# Patient Record
Sex: Female | Born: 1940 | Race: Black or African American | Hispanic: No | Smoking: Never smoker
Health system: Southern US, Community
[De-identification: ages and names within clinical notes are randomized; demographics above are authoritative.]

## PROBLEM LIST (undated history)

## (undated) DIAGNOSIS — K56609 Unspecified intestinal obstruction, unspecified as to partial versus complete obstruction: Secondary | ICD-10-CM

## (undated) DIAGNOSIS — G8191 Hemiplegia, unspecified affecting right dominant side: Secondary | ICD-10-CM

## (undated) DIAGNOSIS — R51 Headache: Secondary | ICD-10-CM

## (undated) DIAGNOSIS — E119 Type 2 diabetes mellitus without complications: Secondary | ICD-10-CM

## (undated) DIAGNOSIS — R519 Headache, unspecified: Secondary | ICD-10-CM

## (undated) DIAGNOSIS — I639 Cerebral infarction, unspecified: Secondary | ICD-10-CM

## (undated) DIAGNOSIS — E785 Hyperlipidemia, unspecified: Secondary | ICD-10-CM

## (undated) DIAGNOSIS — N189 Chronic kidney disease, unspecified: Secondary | ICD-10-CM

## (undated) DIAGNOSIS — I10 Essential (primary) hypertension: Secondary | ICD-10-CM

## (undated) HISTORY — PX: EYE SURGERY: SHX253

## (undated) HISTORY — PX: BOWEL RESECTION: SHX1257

## (undated) HISTORY — PX: BLADDER SURGERY: SHX569

## (undated) HISTORY — PX: TUBAL LIGATION: SHX77

## (undated) HISTORY — DX: Headache: R51

## (undated) HISTORY — DX: Headache, unspecified: R51.9

## (undated) HISTORY — DX: Chronic kidney disease, unspecified: N18.9

---

## 1998-06-20 ENCOUNTER — Encounter: Payer: Self-pay | Admitting: Emergency Medicine

## 1998-06-20 ENCOUNTER — Inpatient Hospital Stay (HOSPITAL_COMMUNITY): Admission: EM | Admit: 1998-06-20 | Discharge: 1998-06-22 | Payer: Self-pay | Admitting: Emergency Medicine

## 1998-06-22 ENCOUNTER — Encounter: Payer: Self-pay | Admitting: Cardiology

## 1998-08-17 ENCOUNTER — Encounter: Payer: Self-pay | Admitting: Emergency Medicine

## 1998-08-18 ENCOUNTER — Inpatient Hospital Stay (HOSPITAL_COMMUNITY): Admission: EM | Admit: 1998-08-18 | Discharge: 1998-08-20 | Payer: Self-pay | Admitting: Emergency Medicine

## 1998-11-18 ENCOUNTER — Emergency Department (HOSPITAL_COMMUNITY): Admission: EM | Admit: 1998-11-18 | Discharge: 1998-11-18 | Payer: Self-pay | Admitting: Emergency Medicine

## 1998-11-18 ENCOUNTER — Encounter: Payer: Self-pay | Admitting: Emergency Medicine

## 1999-01-18 ENCOUNTER — Ambulatory Visit (HOSPITAL_COMMUNITY): Admission: RE | Admit: 1999-01-18 | Discharge: 1999-01-18 | Payer: Self-pay | Admitting: Cardiology

## 1999-11-08 ENCOUNTER — Encounter: Payer: Self-pay | Admitting: Internal Medicine

## 1999-11-08 ENCOUNTER — Ambulatory Visit (HOSPITAL_COMMUNITY): Admission: RE | Admit: 1999-11-08 | Discharge: 1999-11-08 | Payer: Self-pay | Admitting: Internal Medicine

## 2000-05-23 ENCOUNTER — Encounter: Admission: RE | Admit: 2000-05-23 | Discharge: 2000-05-23 | Payer: Self-pay | Admitting: Internal Medicine

## 2000-05-23 ENCOUNTER — Encounter: Payer: Self-pay | Admitting: Internal Medicine

## 2000-06-11 ENCOUNTER — Encounter: Payer: Self-pay | Admitting: Cardiology

## 2000-06-12 ENCOUNTER — Inpatient Hospital Stay (HOSPITAL_COMMUNITY): Admission: RE | Admit: 2000-06-12 | Discharge: 2000-06-14 | Payer: Self-pay | Admitting: Cardiology

## 2000-06-12 ENCOUNTER — Encounter: Payer: Self-pay | Admitting: Neurology

## 2000-06-19 ENCOUNTER — Emergency Department (HOSPITAL_COMMUNITY): Admission: EM | Admit: 2000-06-19 | Discharge: 2000-06-19 | Payer: Self-pay | Admitting: Emergency Medicine

## 2000-06-19 ENCOUNTER — Encounter: Payer: Self-pay | Admitting: Emergency Medicine

## 2000-06-22 ENCOUNTER — Emergency Department (HOSPITAL_COMMUNITY): Admission: EM | Admit: 2000-06-22 | Discharge: 2000-06-22 | Payer: Self-pay

## 2000-06-24 ENCOUNTER — Ambulatory Visit (HOSPITAL_COMMUNITY): Admission: RE | Admit: 2000-06-24 | Discharge: 2000-06-24 | Payer: Self-pay | Admitting: Urology

## 2000-06-24 ENCOUNTER — Encounter (INDEPENDENT_AMBULATORY_CARE_PROVIDER_SITE_OTHER): Payer: Self-pay | Admitting: Specialist

## 2000-06-24 ENCOUNTER — Encounter (INDEPENDENT_AMBULATORY_CARE_PROVIDER_SITE_OTHER): Payer: Self-pay

## 2000-09-06 ENCOUNTER — Other Ambulatory Visit: Admission: RE | Admit: 2000-09-06 | Discharge: 2000-09-06 | Payer: Self-pay | Admitting: Urology

## 2001-01-16 ENCOUNTER — Encounter: Payer: Self-pay | Admitting: Gastroenterology

## 2001-01-16 ENCOUNTER — Ambulatory Visit (HOSPITAL_COMMUNITY): Admission: RE | Admit: 2001-01-16 | Discharge: 2001-01-16 | Payer: Self-pay | Admitting: Gastroenterology

## 2001-01-22 ENCOUNTER — Ambulatory Visit (HOSPITAL_COMMUNITY): Admission: RE | Admit: 2001-01-22 | Discharge: 2001-01-22 | Payer: Self-pay | Admitting: Gastroenterology

## 2001-11-20 ENCOUNTER — Encounter: Admission: RE | Admit: 2001-11-20 | Discharge: 2002-02-18 | Payer: Self-pay | Admitting: Internal Medicine

## 2002-03-17 ENCOUNTER — Emergency Department (HOSPITAL_COMMUNITY): Admission: EM | Admit: 2002-03-17 | Discharge: 2002-03-17 | Payer: Self-pay | Admitting: Emergency Medicine

## 2002-03-17 ENCOUNTER — Encounter: Payer: Self-pay | Admitting: Emergency Medicine

## 2002-03-20 ENCOUNTER — Encounter: Payer: Self-pay | Admitting: Internal Medicine

## 2002-03-20 ENCOUNTER — Encounter: Admission: RE | Admit: 2002-03-20 | Discharge: 2002-03-20 | Payer: Self-pay | Admitting: Internal Medicine

## 2002-05-06 ENCOUNTER — Emergency Department (HOSPITAL_COMMUNITY): Admission: EM | Admit: 2002-05-06 | Discharge: 2002-05-06 | Payer: Self-pay | Admitting: Emergency Medicine

## 2005-04-25 ENCOUNTER — Emergency Department (HOSPITAL_COMMUNITY): Admission: EM | Admit: 2005-04-25 | Discharge: 2005-04-25 | Payer: Self-pay | Admitting: Emergency Medicine

## 2006-03-25 ENCOUNTER — Inpatient Hospital Stay (HOSPITAL_COMMUNITY): Admission: EM | Admit: 2006-03-25 | Discharge: 2006-03-29 | Payer: Self-pay | Admitting: Emergency Medicine

## 2006-03-26 ENCOUNTER — Encounter (INDEPENDENT_AMBULATORY_CARE_PROVIDER_SITE_OTHER): Payer: Self-pay | Admitting: *Deleted

## 2006-03-26 ENCOUNTER — Encounter (INDEPENDENT_AMBULATORY_CARE_PROVIDER_SITE_OTHER): Payer: Self-pay | Admitting: Cardiology

## 2006-04-02 ENCOUNTER — Inpatient Hospital Stay (HOSPITAL_COMMUNITY): Admission: EM | Admit: 2006-04-02 | Discharge: 2006-04-04 | Payer: Self-pay | Admitting: Emergency Medicine

## 2006-04-17 ENCOUNTER — Encounter: Admission: RE | Admit: 2006-04-17 | Discharge: 2006-05-15 | Payer: Self-pay | Admitting: Neurology

## 2007-01-24 ENCOUNTER — Encounter: Admission: RE | Admit: 2007-01-24 | Discharge: 2007-01-24 | Payer: Self-pay | Admitting: Nephrology

## 2007-03-24 ENCOUNTER — Ambulatory Visit (HOSPITAL_COMMUNITY): Admission: RE | Admit: 2007-03-24 | Discharge: 2007-03-24 | Payer: Self-pay | Admitting: Nephrology

## 2007-04-02 ENCOUNTER — Ambulatory Visit (HOSPITAL_COMMUNITY): Admission: RE | Admit: 2007-04-02 | Discharge: 2007-04-02 | Payer: Self-pay | Admitting: Nephrology

## 2008-03-12 ENCOUNTER — Inpatient Hospital Stay (HOSPITAL_COMMUNITY): Admission: EM | Admit: 2008-03-12 | Discharge: 2008-03-14 | Payer: Self-pay | Admitting: Emergency Medicine

## 2008-06-22 ENCOUNTER — Encounter: Admission: RE | Admit: 2008-06-22 | Discharge: 2008-06-22 | Payer: Self-pay | Admitting: Internal Medicine

## 2008-07-12 ENCOUNTER — Encounter: Admission: RE | Admit: 2008-07-12 | Discharge: 2008-08-12 | Payer: Self-pay | Admitting: Internal Medicine

## 2010-04-17 ENCOUNTER — Ambulatory Visit: Payer: Self-pay | Admitting: Cardiovascular Disease

## 2010-04-17 ENCOUNTER — Inpatient Hospital Stay (HOSPITAL_COMMUNITY): Admission: EM | Admit: 2010-04-17 | Discharge: 2010-05-03 | Payer: Self-pay | Admitting: Emergency Medicine

## 2010-04-19 ENCOUNTER — Encounter (INDEPENDENT_AMBULATORY_CARE_PROVIDER_SITE_OTHER): Payer: Self-pay | Admitting: Internal Medicine

## 2010-04-21 ENCOUNTER — Ambulatory Visit: Payer: Self-pay | Admitting: Psychiatry

## 2010-04-28 ENCOUNTER — Ambulatory Visit: Payer: Self-pay | Admitting: Oncology

## 2010-06-29 ENCOUNTER — Ambulatory Visit: Payer: Self-pay | Admitting: Psychiatry

## 2010-07-27 ENCOUNTER — Ambulatory Visit: Payer: Self-pay | Admitting: Oncology

## 2010-08-09 LAB — CHCC SMEAR

## 2010-08-09 LAB — CBC WITH DIFFERENTIAL/PLATELET
BASO%: 1.1 % (ref 0.0–2.0)
EOS%: 3 % (ref 0.0–7.0)
Eosinophils Absolute: 0.1 10*3/uL (ref 0.0–0.5)
MCHC: 31.9 g/dL (ref 31.5–36.0)
MCV: 94.4 fL (ref 79.5–101.0)
MONO%: 9.5 % (ref 0.0–14.0)
NEUT#: 2.1 10*3/uL (ref 1.5–6.5)
RBC: 3.95 10*6/uL (ref 3.70–5.45)
RDW: 17.5 % — ABNORMAL HIGH (ref 11.2–14.5)

## 2010-08-11 LAB — COMPREHENSIVE METABOLIC PANEL
ALT: 35 U/L (ref 0–35)
AST: 35 U/L (ref 0–37)
Alkaline Phosphatase: 68 U/L (ref 39–117)
CO2: 25 mEq/L (ref 19–32)
Sodium: 139 mEq/L (ref 135–145)
Total Bilirubin: 0.4 mg/dL (ref 0.3–1.2)
Total Protein: 7.2 g/dL (ref 6.0–8.3)

## 2010-08-11 LAB — SPEP & IFE WITH QIG
Albumin ELP: 54.8 % — ABNORMAL LOW (ref 55.8–66.1)
IgA: 392 mg/dL — ABNORMAL HIGH (ref 68–378)
IgG (Immunoglobin G), Serum: 1540 mg/dL (ref 694–1618)
IgM, Serum: 100 mg/dL (ref 60–263)
Total Protein, Serum Electrophoresis: 7.2 g/dL (ref 6.0–8.3)

## 2010-09-03 ENCOUNTER — Encounter: Payer: Self-pay | Admitting: Nephrology

## 2010-10-26 LAB — CBC
HCT: 32.7 % — ABNORMAL LOW (ref 36.0–46.0)
HCT: 28.8 % — ABNORMAL LOW (ref 36.0–46.0)
Hemoglobin: 10.4 g/dL — ABNORMAL LOW (ref 12.0–15.0)
Hemoglobin: 10.7 g/dL — ABNORMAL LOW (ref 12.0–15.0)
Hemoglobin: 9.8 g/dL — ABNORMAL LOW (ref 12.0–15.0)
MCH: 30.1 pg (ref 26.0–34.0)
MCH: 30.6 pg (ref 26.0–34.0)
MCH: 30.4 pg (ref 26.0–34.0)
MCH: 30.7 pg (ref 26.0–34.0)
MCHC: 32.7 g/dL (ref 30.0–36.0)
MCHC: 32.9 g/dL (ref 30.0–36.0)
MCHC: 33.7 g/dL (ref 30.0–36.0)
MCV: 93.4 fL (ref 78.0–100.0)
MCV: 92 fL (ref 78.0–100.0)
Platelets: 513 10*3/uL — ABNORMAL HIGH (ref 150–400)
Platelets: 292 10*3/uL (ref 150–400)
Platelets: 337 10*3/uL (ref 150–400)
Platelets: 395 10*3/uL (ref 150–400)
Platelets: 490 10*3/uL — ABNORMAL HIGH (ref 150–400)
RBC: 3.46 MIL/uL — ABNORMAL LOW (ref 3.87–5.11)
RBC: 3.5 MIL/uL — ABNORMAL LOW (ref 3.87–5.11)
RBC: 3.74 MIL/uL — ABNORMAL LOW (ref 3.87–5.11)
RDW: 14.7 % (ref 11.5–15.5)
RDW: 14.4 % (ref 11.5–15.5)
RDW: 14.5 % (ref 11.5–15.5)
RDW: 14.6 % (ref 11.5–15.5)
RDW: 14.7 % (ref 11.5–15.5)
WBC: 8.3 10*3/uL (ref 4.0–10.5)
WBC: 9.1 10*3/uL (ref 4.0–10.5)

## 2010-10-26 LAB — COMPREHENSIVE METABOLIC PANEL
AST: 45 U/L — ABNORMAL HIGH (ref 0–37)
Albumin: 3.1 g/dL — ABNORMAL LOW (ref 3.5–5.2)
Alkaline Phosphatase: 71 U/L (ref 39–117)
Chloride: 104 mEq/L (ref 96–112)
GFR calc Af Amer: 16 mL/min — ABNORMAL LOW (ref >60)
Potassium: 3.7 mEq/L (ref 3.5–5.1)
Sodium: 141 mEq/L (ref 135–145)
Total Bilirubin: 0.8 mg/dL (ref 0.3–1.2)
Total Protein: 8.2 g/dL (ref 6.0–8.3)

## 2010-10-26 LAB — CULTURE, BLOOD (ROUTINE X 2)
Culture: NO GROWTH
Culture: NO GROWTH

## 2010-10-26 LAB — GLUCOSE, CAPILLARY
Glucose-Capillary: 116 mg/dL — ABNORMAL HIGH (ref 70–99)
Glucose-Capillary: 122 mg/dL — ABNORMAL HIGH (ref 70–99)
Glucose-Capillary: 137 mg/dL — ABNORMAL HIGH (ref 70–99)
Glucose-Capillary: 142 mg/dL — ABNORMAL HIGH (ref 70–99)
Glucose-Capillary: 146 mg/dL — ABNORMAL HIGH (ref 70–99)
Glucose-Capillary: 170 mg/dL — ABNORMAL HIGH (ref 70–99)
Glucose-Capillary: 197 mg/dL — ABNORMAL HIGH (ref 70–99)
Glucose-Capillary: 206 mg/dL — ABNORMAL HIGH (ref 70–99)
Glucose-Capillary: 214 mg/dL — ABNORMAL HIGH (ref 70–99)
Glucose-Capillary: 218 mg/dL — ABNORMAL HIGH (ref 70–99)
Glucose-Capillary: 252 mg/dL — ABNORMAL HIGH (ref 70–99)
Glucose-Capillary: 285 mg/dL — ABNORMAL HIGH (ref 70–99)
Glucose-Capillary: 98 mg/dL (ref 70–99)
Glucose-Capillary: 99 mg/dL (ref 70–99)
Glucose-Capillary: 113 mg/dL — ABNORMAL HIGH (ref 70–99)
Glucose-Capillary: 123 mg/dL — ABNORMAL HIGH (ref 70–99)
Glucose-Capillary: 133 mg/dL — ABNORMAL HIGH (ref 70–99)
Glucose-Capillary: 134 mg/dL — ABNORMAL HIGH (ref 70–99)
Glucose-Capillary: 151 mg/dL — ABNORMAL HIGH (ref 70–99)
Glucose-Capillary: 153 mg/dL — ABNORMAL HIGH (ref 70–99)
Glucose-Capillary: 159 mg/dL — ABNORMAL HIGH (ref 70–99)
Glucose-Capillary: 161 mg/dL — ABNORMAL HIGH (ref 70–99)
Glucose-Capillary: 162 mg/dL — ABNORMAL HIGH (ref 70–99)
Glucose-Capillary: 167 mg/dL — ABNORMAL HIGH (ref 70–99)
Glucose-Capillary: 170 mg/dL — ABNORMAL HIGH (ref 70–99)
Glucose-Capillary: 185 mg/dL — ABNORMAL HIGH (ref 70–99)
Glucose-Capillary: 186 mg/dL — ABNORMAL HIGH (ref 70–99)
Glucose-Capillary: 186 mg/dL — ABNORMAL HIGH (ref 70–99)
Glucose-Capillary: 190 mg/dL — ABNORMAL HIGH (ref 70–99)
Glucose-Capillary: 205 mg/dL — ABNORMAL HIGH (ref 70–99)
Glucose-Capillary: 212 mg/dL — ABNORMAL HIGH (ref 70–99)
Glucose-Capillary: 237 mg/dL — ABNORMAL HIGH (ref 70–99)
Glucose-Capillary: 254 mg/dL — ABNORMAL HIGH (ref 70–99)
Glucose-Capillary: 256 mg/dL — ABNORMAL HIGH (ref 70–99)
Glucose-Capillary: 264 mg/dL — ABNORMAL HIGH (ref 70–99)
Glucose-Capillary: 279 mg/dL — ABNORMAL HIGH (ref 70–99)
Glucose-Capillary: 78 mg/dL (ref 70–99)

## 2010-10-26 LAB — URINE CULTURE
Colony Count: NO GROWTH
Colony Count: >=100000
Culture  Setup Time: 201109182058
Culture: NO GROWTH
Culture: NO GROWTH

## 2010-10-26 LAB — URINALYSIS, ROUTINE W REFLEX MICROSCOPIC
Glucose, UA: NEGATIVE mg/dL
Glucose, UA: 100 mg/dL — AB
Glucose, UA: NEGATIVE mg/dL
Hgb urine dipstick: NEGATIVE
Ketones, ur: 15 mg/dL — AB
Ketones, ur: 15 mg/dL — AB
Ketones, ur: 15 mg/dL — AB
Leukocytes, UA: NEGATIVE
Nitrite: NEGATIVE
Nitrite: NEGATIVE
Nitrite: NEGATIVE
Protein, ur: NEGATIVE mg/dL
Specific Gravity, Urine: 1.025 (ref 1.005–1.030)
Specific Gravity, Urine: 1.023 (ref 1.005–1.030)
Specific Gravity, Urine: 1.023 (ref 1.005–1.030)
Urobilinogen, UA: 1 mg/dL (ref 0.0–1.0)
pH: 5 (ref 5.0–8.0)
pH: 5 (ref 5.0–8.0)
pH: 5.5 (ref 5.0–8.0)

## 2010-10-26 LAB — TSH: TSH: 3.895 u[IU]/mL (ref 0.350–4.500)

## 2010-10-26 LAB — BASIC METABOLIC PANEL
BUN: 25 mg/dL — ABNORMAL HIGH (ref 6–23)
BUN: 24 mg/dL — ABNORMAL HIGH (ref 6–23)
BUN: 26 mg/dL — ABNORMAL HIGH (ref 6–23)
BUN: 33 mg/dL — ABNORMAL HIGH (ref 6–23)
CO2: 22 mEq/L (ref 19–32)
CO2: 23 mEq/L (ref 19–32)
CO2: 24 mEq/L (ref 19–32)
CO2: 23 mEq/L (ref 19–32)
Calcium: 9 mg/dL (ref 8.4–10.5)
Calcium: 9.2 mg/dL (ref 8.4–10.5)
Calcium: 8.8 mg/dL (ref 8.4–10.5)
Calcium: 9.3 mg/dL (ref 8.4–10.5)
Calcium: 9.5 mg/dL (ref 8.4–10.5)
Chloride: 102 mEq/L (ref 96–112)
Chloride: 104 mEq/L (ref 96–112)
Chloride: 109 mEq/L (ref 96–112)
Creatinine, Ser: 1.33 mg/dL — ABNORMAL HIGH (ref 0.4–1.2)
Creatinine, Ser: 1.38 mg/dL — ABNORMAL HIGH (ref 0.4–1.2)
Creatinine, Ser: 1.54 mg/dL — ABNORMAL HIGH (ref 0.4–1.2)
Creatinine, Ser: 1.85 mg/dL — ABNORMAL HIGH (ref 0.4–1.2)
GFR calc Af Amer: 41 mL/min — ABNORMAL LOW (ref >60)
GFR calc Af Amer: 46 mL/min — ABNORMAL LOW (ref >60)
GFR calc Af Amer: 48 mL/min — ABNORMAL LOW (ref >60)
GFR calc Af Amer: 33 mL/min — ABNORMAL LOW (ref >60)
GFR calc non Af Amer: 39 mL/min — ABNORMAL LOW (ref >60)
GFR calc non Af Amer: 27 mL/min — ABNORMAL LOW (ref >60)
GFR calc non Af Amer: 33 mL/min — ABNORMAL LOW (ref >60)
GFR calc non Af Amer: 33 mL/min — ABNORMAL LOW (ref >60)
GFR calc non Af Amer: 37 mL/min — ABNORMAL LOW (ref >60)
Glucose, Bld: 136 mg/dL — ABNORMAL HIGH (ref 70–99)
Glucose, Bld: 148 mg/dL — ABNORMAL HIGH (ref 70–99)
Glucose, Bld: 305 mg/dL — ABNORMAL HIGH (ref 70–99)
Potassium: 4.1 mEq/L (ref 3.5–5.1)
Potassium: 4.2 mEq/L (ref 3.5–5.1)
Potassium: 5.1 mEq/L (ref 3.5–5.1)
Potassium: 2.9 mEq/L — ABNORMAL LOW (ref 3.5–5.1)
Potassium: 4 mEq/L (ref 3.5–5.1)
Potassium: 4.2 mEq/L (ref 3.5–5.1)
Potassium: 4.4 mEq/L (ref 3.5–5.1)
Sodium: 135 mEq/L (ref 135–145)
Sodium: 136 mEq/L (ref 135–145)
Sodium: 137 mEq/L (ref 135–145)
Sodium: 136 mEq/L (ref 135–145)
Sodium: 141 mEq/L (ref 135–145)

## 2010-10-26 LAB — DIFFERENTIAL
Basophils Absolute: 0 10*3/uL (ref 0.0–0.1)
Basophils Relative: 0 % (ref 0–1)
Eosinophils Relative: 0 % (ref 0–5)
Lymphocytes Relative: 14 % (ref 12–46)
Monocytes Absolute: 0.6 10*3/uL (ref 0.1–1.0)
Monocytes Relative: 7 % (ref 3–12)

## 2010-10-26 LAB — PROTEIN ELECTROPH W RFLX QUANT IMMUNOGLOBULINS
Alpha-2-Globulin: 18.9 % — ABNORMAL HIGH (ref 7.1–11.8)
Beta Globulin: 5.5 % (ref 4.7–7.2)
Gamma Globulin: 19.8 % — ABNORMAL HIGH (ref 11.1–18.8)
M-Spike, %: NOT DETECTED g/dL

## 2010-10-26 LAB — CK TOTAL AND CKMB (NOT AT ARMC)
CK, MB: 5.5 ng/mL — ABNORMAL HIGH (ref 0.3–4.0)
Relative Index: 0.3 (ref 0.0–2.5)
Relative Index: 0.9 (ref 0.0–2.5)
Total CK: 1745 U/L — ABNORMAL HIGH (ref 7–177)
Total CK: 656 U/L — ABNORMAL HIGH (ref 7–177)

## 2010-10-26 LAB — URINALYSIS, MICROSCOPIC ONLY
Glucose, UA: NEGATIVE mg/dL
Hgb urine dipstick: NEGATIVE
Specific Gravity, Urine: 1.021 (ref 1.005–1.030)
pH: 5.5 (ref 5.0–8.0)

## 2010-10-26 LAB — IGG, IGA, IGM
IgA: 531 mg/dL — ABNORMAL HIGH (ref 68–378)
IgG (Immunoglobin G), Serum: 1740 mg/dL — ABNORMAL HIGH (ref 694–1618)
IgM, Serum: 106 mg/dL (ref 60–263)

## 2010-10-26 LAB — CARDIAC PANEL(CRET KIN+CKTOT+MB+TROPI)
CK, MB: 5.2 ng/mL — ABNORMAL HIGH (ref 0.3–4.0)
CK, MB: 6.2 ng/mL (ref 0.3–4.0)
CK, MB: 7.1 ng/mL (ref 0.3–4.0)
Relative Index: 0.3 (ref 0.0–2.5)
Relative Index: 0.7 (ref 0.0–2.5)
Relative Index: 0.8 (ref 0.0–2.5)
Total CK: 1527 U/L — ABNORMAL HIGH (ref 7–177)
Total CK: 1661 U/L — ABNORMAL HIGH (ref 7–177)
Total CK: 894 U/L — ABNORMAL HIGH (ref 7–177)
Troponin I: 0.1 ng/mL — ABNORMAL HIGH (ref 0.00–0.06)
Troponin I: 0.14 ng/mL — ABNORMAL HIGH (ref 0.00–0.06)

## 2010-10-26 LAB — HEPATIC FUNCTION PANEL
ALT: 17 U/L (ref 0–35)
Bilirubin, Direct: 0.1 mg/dL (ref 0.0–0.3)
Indirect Bilirubin: 0.5 mg/dL (ref 0.3–0.9)
Total Bilirubin: 0.6 mg/dL (ref 0.3–1.2)

## 2010-10-26 LAB — BRAIN NATRIURETIC PEPTIDE: Pro B Natriuretic peptide (BNP): 46 pg/mL (ref 0.0–100.0)

## 2010-10-26 LAB — HEMOGLOBIN A1C: Hgb A1c MFr Bld: 9.5 % — ABNORMAL HIGH (ref <5.7)

## 2010-10-26 LAB — URINE MICROSCOPIC-ADD ON

## 2010-10-26 LAB — UIFE/LIGHT CHAINS/TP QN, 24-HR UR
Albumin, U: DETECTED
Alpha 1, Urine: DETECTED — AB

## 2010-10-26 LAB — ALDOLASE: Aldolase: 9.7 U/L — ABNORMAL HIGH (ref <=8.1)

## 2010-10-26 LAB — APTT: aPTT: 30 seconds (ref 24–37)

## 2010-10-26 LAB — TROPONIN I: Troponin I: 0.11 ng/mL — ABNORMAL HIGH (ref 0.00–0.06)

## 2010-12-26 NOTE — Discharge Summary (Signed)
NAMECYMONE, CIFARELLI                ACCOUNT NO.:  [id number]   MEDICAL RECORD NO.:  [id number]          PATIENT TYPE:  INP   LOCATION:  2024                         FACILITY:  [facility]   PHYSICIAN:  Hind I Elsaid, MD      DATE OF BIRTH:  1941/07/04   DATE OF ADMISSION:  03/11/2008  DATE OF DISCHARGE:  03/14/2008                               DISCHARGE SUMMARY   PRIMARY CARE PHYSICIAN:  Thressa Sheller, MD   DISCHARGE DIAGNOSES:  1. Hypertension urgency.  2. Uncontrolled diabetes mellitus with hemoglobin C1 of 11.7.  3. Group B Streptococcal agalactiae urinary tract infection.  4. History of stroke.  5. History of patent foramen ovale.  6. mild elevated troponin felt secondary to hypertension urgency.   DISCHARGE MEDICATIONS:  1. Hydrochlorothiazide 25 mg daily.  2. Clonidine 0.2 mg twice daily.  3. Aggrenox 1 tab twice daily.  4. Benazepril 20 mg daily.  5. Lantus 20 units subcu q.h.s.  6. Glipizide 2.5 mg daily.  7. Augmentin 500 mg twice daily for 5 days.  8. Darvocet 1-2 tabs p.o. q.6 h. p.r.n.   CONSULTATIONS:  None.   PROCEDURES:  CT head, no acute intracranial abnormalities.   HOSPITAL COURSE:  1. This is a 10, African American female, history of hypertension and      previous history of stroke.  She was sent from Dr. Doristine Devoid      office after found to have blood pressure of 204/140.  The patient      admitted to the hospital with a diagnosis of hypertensive urgency,      started on IV labetalol and plan is to control her blood pressure.      Her blood pressure at this time is under good control.  We      increased her clonidine to 0.2 mg q.8 h. and increased her      hydrochlorothiazide 25 mg daily.  2. Hypokalemia, status post replacement.  3. Diabetes mellitus.  The patient has hemoglobin C1 of 11, insulin      Lantus was started.  During hospitalization, the patient admitted      unable to take metformin secondary to metformin induced diarrhea.      For that,  we will increase her Lantus to 20 units subcu and add a      small dose of glipizide.  Further recommendation to be done by Dr.      Noah Delaine at his office.  4. Elevated troponin felt to be secondary to hypertension and mild      renal insufficiency.  The patient already on aspirin.  No further      workup was done and the patient does not have any chest pain and      there are no EKG changes.  5. History of cerebrovascular accident, to continue with Aggrenox.   We felt the patient is medically stable to be discharged home, follow  with Dr. Noah Delaine as an outpatient for further control of her blood  pressure and her diabetes.      Hind I Laurie Panda, MD  Electronically Signed  HIE/MEDQ  D:  03/14/2008  T:  03/15/2008  Job:  [id number]

## 2010-12-26 NOTE — H&P (Signed)
Donna Green, Donna Green                ACCOUNT NO.:  [id number]   MEDICAL RECORD NO.:  [id number]          PATIENT TYPE:  EMS   LOCATION:  MAJO                         FACILITY:  [facility]   PHYSICIAN:  Mobolaji B. Bakare, M.D.DATE OF BIRTH:  1940/08/20   DATE OF ADMISSION:  03/11/2008  DATE OF DISCHARGE:                              HISTORY & PHYSICAL   PRIMARY CARE PHYSICIAN:  Dr. Rushie Nyhan.   CHIEF COMPLAINT:  Elevated blood pressure.   HISTORY OF PRESENTING COMPLAINT:  Donna Green is a 70 year old African  American female with history of hypertension and previous stroke.  She  is a very poor historian.  She could not give details of current  illness.  She tells me she was not feeling well last Saturday, and she  went to Dr. Noland Fordyce office today.  She was noted to have an elevated  blood pressure.  Hence, the patient was sent over to the emergency room.  Blood pressure at Dr. Noland Fordyce office was 240/140.  The patient had a  headache on arrival in the emergency room.  She tells me that after  receiving clonidine p.o., she felt better.  Blood pressure upon arrival  here was 217/128.  She had some slight headache, which she states has  improved.  There is no shortness of breath, no chest pain, no confusion.  The patient has received 0.1 mg of clonidine.  She rated pressure in the  suprapubic region with foul-smelling urine, but no dysuria.  She stated  that this is similar to previous UTIs she has had before.  Due to this  pain, she received fentanyl 100 mcg IV push at the emergency room.  The  patient denies noncompliance with medication.  She tells me that she has  been using her medications regularly.   EKG shows sinus tachycardia.  Initial set of cardiac markers at the  point of care showed normal CK-MB and a slightly elevated CK to 208.  Troponin was 0.18.   REVIEW OF SYSTEMS:  She denies blurry vision.  No nausea or vomiting.  She has no diarrhea or abdominal pain.   She related that she had  numbness, headache and dizziness on the right side 5 days ago, which  resolved after she used __________.  She has no focal weakness.  There  is no fever, chills, cough.  No abdominal pain.   PAST MEDICAL HISTORY:  1. CVA in 2007 by cerebral embolic infarct involving the parietal      cortex and right cerebellar hemisphere.  No clear source of      embolism sound.  2. History of small patent foramen ovale.  3. Hypertension.  4. Diabetes mellitus.  5. History of deep vein thrombosis.   PAST SURGICAL HISTORY:  1. Arthroscopy, left knee.  2. Hysterectomy in 1978.  3. Bladder surgery.   CURRENT MEDICATIONS:  1. Hydrochlorothiazide 12.5 mg daily.  2. Clonidine 0.2 mg daily.  3. Aggrenox 25/200 daily.  4. Tramadol 500 mg p.r.n.  5. Folbic daily.  6. Benazepril 20 mg daily.  7. Lantus 50 units daily.  ALLERGIES:  CODEINE.   FAMILY HISTORY:  Positive for hypertension.  No family history of CAD or  stroke.   SOCIAL HISTORY:  The patient lives at home with her daughter.  She is  fairly independent with activities of daily living.  She denies alcohol  abuse and drug use.  She does not smoke cigarettes.   PHYSICAL EXAMINATION:  VITAL SIGNS:  Initial vitals; temperature 99.2,  blood pressure 217/128, pulse of 145, respiratory rate of 16.  O2 sats  of 94%.  Current blood pressure is 225/127.  GENERAL:  The patient is awake, alert, oriented to time, place and  person.  Not in acute respiratory distress.  HEENT:  Normocephalic, atraumatic head.  NECK:  No elevated JVD.  No carotid bruit.  LUNGS:  Clear clinically to auscultation.  CVS:  S1-S2 regular.  No murmur or gallop.  ABDOMEN:  Not distended.  Soft, nontender.  Bowel sounds present.  No  palpable organomegaly.  EXTREMITIES:  No pedal edema or calf tenderness.  Dorsalis pedis pulses are palpable bilaterally.  CNS:.  No focal  neurological weakness.  SKIN:  No rash or petechiae.   INITIAL LABORATORY  DATA:  White cells 6.9, hemoglobin 11.6, hematocrit  36.9, platelets 384, normal differential.  Sodium 137, potassium 3.0,  chloride 101, bicarb 26, glucose 321, BUN 12, creatinine 1.19, calcium  8.9, CK 202, CK-MB 2.5, troponin 0.18.   Head CT scan shows no acute intracranial abnormality.  EKG shows sinus  tachycardia with a heart rate of 151, nonspecific ST abnormality, which  are likely rate related.   ASSESSMENT AND PLAN:  Donna Green is a 70 year old African American  female with history of hypertension presenting with hypertensive  urgency.  She has no obvious end-organ damage at this point.  The  patient will be admitted for blood pressure control.   ADMISSION DIAGNOSES:  1. Hypertensive urgency.  Will change clonidine to 0.2 mg p.o. t.i.d.      with the first dose now.  Benazepril will be continued at 20 mg      daily.  Hydrochlorothiazide will be increased to 25 mg daily, and      this will be given in the morning.  For tonight, will give      labetalol 20 mg IV x1, and then 10 mg IV p.r.n. for systolic blood      pressure greater than A999333 and diastolic greater than A999333.  Further      adjustment to be made to blood pressure medications as deemed      necessary.  2. Hypokalemia secondary to diuretics.  Will replete potassium      chloride.  3. Elevated troponin in the setting of sinus tachycardia.  Will cycle      cardiac enzymes.  The patient has no chest pain.  EKG shows no      acute abnormalities.  4. Diabetes mellitus.  Will resume Lantus 15 units subcutaneous daily.      Blood glucose is uncontrolled.  Will check hemoglobin A1c to      further assess blood sugar control.  5. History of cerebrovascular accident.  Will continue with Aggrenox.      Mobolaji B. Maia Petties, M.D.  Electronically Signed     MBB/MEDQ  D:  03/12/2008  T:  03/12/2008  Job:  [id number]   cc:   Rushie Nyhan, M.D.  Pramod P. Leonie Man, MD

## 2010-12-29 NOTE — H&P (Signed)
NAMEJOYCIE, ZUNKER                ACCOUNT NO.:  [id number]   MEDICAL RECORD NO.:  [id number]          PATIENT TYPE:  INP   LOCATION:  [place]                         FACILITY:  [facility]   PHYSICIAN:  Princess Bruins. Hickling, M.D.DATE OF BIRTH:  Dec 16, 1940   DATE OF ADMISSION:  03/25/2006  DATE OF DISCHARGE:                                HISTORY & PHYSICAL   CHIEF COMPLAINT:  Confusion and problems speaking   HISTORY OF PRESENT ILLNESS:  This 70 year old woman, last known to be well  around, 12 noon when she took her daughter to work.  Grandson came home  around 1310.  She is confused, not making sense.  She lay down.  He insisted  that she go to the hospital, and she was brought to [facility] by 1344.  Triage at  1413, confusion, not following commands.  CT scan of the brain 1422.  Code  stroke was called [id number].  I interpreted the study that showed an old left  insular infarction, nothing acute (my reading).  She had hypertension, blood  pressure 233/135, was given labetalol 20 mg and Cardene drip.  TPA was  ordered of 1440, given at 1453.  NIH stroke scale 5, 1 for 1A-1, 1B-2, 9-2.  She had been unable to use her right side in the CT scan but did not show  signs of right-sided weakness at the time I assessed her.   PAST MEDICAL HISTORY:  Positive for:  1. Hypertension.  2. Type 2 diabetes mellitus, insulin dependent.  3. Prior cerebrovascular accident in 2001.  4. Anxiety disorder.   PAST SURGICAL HISTORY:  1. EGD and colonoscopy January 22, 2001.  2. Cystoscopy for gross hematuria June 24, 2000.  3. Cardiac catheterization for angina June 11, 2000. At that time, she      developed right visual field deficit and was noted to have infarction      in the left posterior parietal region on MRI scan.  She also had      periventricular and subcortical white matter disease, hypoplastic A1      segment on MRA and mild diffuse atherosclerotic disease.   FAMILY HISTORY:  Positive for  hypertension.  Negative for heart disease.  We  do not know about stroke.   SOCIAL HISTORY:  The patient is widowed.  She has children.  She does not  use tobacco.   CURRENT MEDICATIONS:  Are unknown.  She is aphasic, and her daughter does  not know.   DRUG ALLERGIES:  Intolerant to CODEINE: She passed out.   PHYSICAL EXAMINATION:  VITAL SIGNS: Blood pressure 217/127, resting pulse  115, respirations 20, oxygen saturation 100%  EAR, NOSE, THROAT: No infections.  No bruits. No meningismus.  LUNGS: Clear.  HEART:  No murmurs.  Pulses normal.  ABDOMEN:  Soft.  Bowel sounds normal.  No hepatosplenomegaly.  EXTREMITIES:  Unremarkable.  NEUROLOGIC: Mental status:  The patient is awake, severely aphasic in an  expressive sense.  She is able to follow some commands.  Cranial nerves:  Round, reactive pupils.  Visual fields full to scare. Symmetric facial  strength.  Midline tongue. Motor examination: No drift.  Her grips are weak.  She wiggles her fingers poorly.  Sensory:  Withdrawal x4.  Deep tendon  reflexes diminished.  Patient has bilateral flexor plantar response.   IMPRESSION:  Left brain embolic infarction.  I suspect the opercular branch  of the left middle cerebral artery. Despite this, because of her history of  diabetes, I worry about a more proximal occlusion. For that reason, we gave  two-thirds t-PA and scheduled an angiogram to further assess the patient.  We will treat her diabetes. She received 20 mg of labetalol and was placed  on Cardene drip for hypertension.   Her laboratories are as follows. Sodium 137, potassium 3.8, chloride 103,  BUN 17, glucose 117.  Her pH is 7.44, bicarbonate 24.5. Hemoglobin 12.6,  hematocrit 37.8, MCV 91, platelet count 313,000, 44 polys, 43 lymphs, 8%  monos, 4% eosinophils, 1% basophils absolute neutrophil count 2200.  Prothrombin time 12.5, PTT 28 seconds.      Princess Bruins. Gaynell Face, M.D.  Electronically Signed     WHH/MEDQ  D:   03/25/2006  T:  03/25/2006  Job:  [id number]   cc:   Ernestene Kiel, M.D.

## 2010-12-29 NOTE — Discharge Summary (Signed)
NAMESWETHA, MAGNONE                ACCOUNT NO.:  [id number]   MEDICAL RECORD NO.:  [id number]          PATIENT TYPE:  INP   LOCATION:  3009                         FACILITY:  [facility]   PHYSICIAN:  Pramod P. Leonie Man, MD    DATE OF BIRTH:  04-19-1941   DATE OF ADMISSION:  03/25/2006  DATE OF DISCHARGE:  03/29/2006                                 DISCHARGE SUMMARY   ADMISSION DIAGNOSIS:  Confusion and speech problems.   DISCHARGE DIAGNOSES:  1. Bi-cerebral embolic infarct involving bi-parietal cortex and right      cerebellar hemisphere.  No clear source of embolism found.  2. Small, patent foramen ovale.  3. Hypertension.  4. Diabetes.  5. Previous history of stroke in 2001.   HOSPITAL COURSE:  Donna [name] is a pleasant 70 year old African-American  lady who was admitted with sudden onset of confusion and speech problems on  the day of admission.  Her daughter brought her to the emergency room  immediately and Code Stroke was called.  Dr. Westley Gambles evaluated the patient  and found her blood pressure to be significantly elevated at 233/135 and she  required a labetalol and Cardene drip.  CT scan did not show acute  abnormality.  Patient was Code 5 on the NIH stroke scale and was given IV  TPA as per standard protocol.  Patient did well with that.  She was kept in  the intensive care unit for 24 hours and blood pressure was tightly  controlled.  Subsequently, repeat CT scan at 24-hour post TPA did not reveal  any acute hemorrhage.  An MRI scan of the brain was subsequently obtained  which showed small infarcts in both parietal lobes and right cerebellar  hemisphere.  Patient had undergone cerebral angiogram at the time of  admission as part of Code Stroke to evaluate for large vessel clot.  Both  carotids and middle cerebral arteries were widely patent.  However, there  was a branch occlusion of the frontal branch of the middle cerebral artery.  The Merci device and clot retrieval was  contemplated but after weighing the  risks and benefits, Dr. Erling Cruz decided not to pursue this.   The patient was seen in the hospital by physical, occupational, and speech  therapy for consultation.  She was, during the hospitalization, found to  have some low-grade fever and increased heart rate with tachycardia, 100-  110.  Transesophageal echocardiogram was done to evaluate for possible  cardiac source of embolism or endocarditis, but no definite clot was found.  A small patent foramen ovale was found.  Lower extremity venous Doppler was  done to look for DVT and it was negative.   The patient's cholesterol was 198, triglycerides 44, HDL 48, and LDL was  elevated at 141.  UA showed evidence of small leukocyte esterase and WBC was  56, but cultures were not positive for infection.  Patient's 2D echo showed  normal ejection fraction without obvious cardiac source of embolism.  Patient was started on Aggrenox for secondary stroke prevention and advised  to take 1 capsule daily for 2  weeks and to increase it to twice a day if  tolerated.  Use Tylenol for headaches.  Hypercoagulable labs were also sent,  results of which were not back at the time of discharge.  Blood cultures -  preliminary report showed no growth.  Patient was counseled about taking  Aggrenox and headache.   I also spoke to the patient and her sister with regards to the PFO which may  be an incidental finding.  The exact treatment for PFO and stroke is still  not clear.  I recommended an outpatient visit with me in a few weeks to do  transcranial Doppler, emboli monitoring and bubble study, and at that time I  would discuss further about treatment options for PFO and stroke.  Arrangements were made for outpatient speech and physical therapy.  The  patient was advised to follow up with her primary physician, Dr. Alyson Ingles,  in 2 weeks, and with Dr. Leonie Man for the transcranial Doppler, emboli  monitoring and bubble study in a  few weeks as well.   DISCHARGE MEDICATIONS:  As follows:  1. Aggrenox 1 capsule daily for 2 weeks, to increase to twice a day.  2. Aspirin 81 mg a day for 2 weeks and then to be stopped.  3. Lotensin 20 mg a day.  4. Clonidine 0.2 mg twice a day.  5. Hydrochlorothiazide 12.5 daily.  6. NovoLog insulin.  7. Metformin 1000 twice a day.  8. Zocor 40 mg a day.           ______________________________  Kathie Rhodes. Leonie Man, MD     PPS/MEDQ  D:  03/29/2006  T:  03/29/2006  Job:  [id number]   cc:   Rushie Nyhan, M.D.

## 2010-12-29 NOTE — Consult Note (Signed)
[place]  Patient:    Donna [name], Donna [name]                       MRN: [id number] Proc. Date: 06/11/00 Adm. Date:  [id number] Attending:  Camelia Phenes CC:         Bryson Dames, M.D.   Consultation Report  DATE OF BIRTH:  1940-12-02  REFERRING PHYSICIAN:  Bryson Dames, M.D.  REASON FOR CONSULTATION:  Visual changes after cardiac catheterization.  HISTORY OF PRESENT ILLNESS:  See initial inpatient consultation evaluation for this 70 year old black female with a past medical history of hypertension, hyperlipidemia, diabetes and anxiety, who underwent outpatient cardiac catheterization yesterday for increasing substernal exertional chest pain. She tolerated the procedure well and was to be treated medically.  However, about 15 minutes after the procedure she noted that she began to have visual changes.  She describes "wavy lines" involving her right visual field.  She also says that there is some distortion when she looks at things as if the size of things is altered.  She cannot see as well off to her right as she can to her left.  In association with this she notes that her fingers got tingly in both hands.  Shortly after this she developed a fairly severe headache which was constant, not terribly throbbing, not associated with nausea and was located probably only on the left side of the head.  The symptoms have waxed and waned somewhat since the catheterization.  She denies any associated focal weakness, dysarthria, dysphasia, bowel, or bladder symptoms.  She reports she used to have a very similar syndrome when she was a child and a young woman in which she would have these visual changes and hand numbness followed by a very severe headache.  This went away and she had had this problem before, at least 20 or 30 years, before today.  PAST MEDICAL HISTORY:  Hypertension, diabetes, hyperlipidemia, and anxiety  as above.  FAMILY HISTORY:  Remarkable for hypertension, negative for coronary artery disease.  SOCIAL HISTORY:  The patient is widowed.  She does not use tobacco.  ALLERGIES:  CODEINE.  MEDICATIONS:  Lotensin/HCTZ, Zocor, Celebrex, Prilosec, Glucophage, aspirin, meprobamate, and Ativan p.r.n.  PHYSICAL EXAMINATION:  VITAL SIGNS:  Afebrile, blood pressure 167/90, heart rate 89, respirations 20.  GENERAL:  She is alert in no evident distress.  HEENT:  Head:  Cranium was normocephalic and atraumatic.  Oropharynx was benign.  NECK:  Supple without bruits.  HEART:  Regular rate and rhythm.  No murmurs.  CHEST:  Clear to auscultation.  NEUROLOGICAL:  Mental status:  She is awake, alert, and fully oriented. Speech is fluent and not dysarthric.  Mood is euthymic and affect appropriate. She gives a clear and concise history.  Cranial nerves:  Funduscopic examination is benign.  Pupils equal and briskly reactive.  Extraocular movements intact without nystagmus.  Visual field testing reveals some loss of the peripheral field on the right in the upper and lower quadrants.  Face moves symmetrically.  Tongue and plate move in the midline.  Motor examination, normal bulk and tone.  No arcuate fasciculations.  Normal strength in all test of extremities.  Muscle sensation somewhat to light touch in the right hand and foot.  Cerebellar:  Rapid alternating movements are normal.  Finger-to-nose is performed slowly but well.  Reflexes are 2+ in the upper extremities, 1+ in the lower extremities.  Toes are  downgoing.  Gait examination is deferred.  LABORATORY DATA:  Neuro imaging has not been done.  IMPRESSION:  Right hemi-visual changes and hemisensory changes with a history of migraine in the setting of post cardiac catheterization.  Given that these are some stereotyped and so much like her previous events, they strongly suggest ______ this event, but, however, given the fact that she  just had a cardiac catheterization need to exclude a left occipital stroke.  RECOMMENDATIONS: 1. Check MRI/MRA tonight. 2. Continue aspirin q.d. 3. Symptomatic treatment for migraine. 4. Followup after imaging.  x DD:  06/11/00 TD:  06/12/00 Job: [id number]

## 2010-12-29 NOTE — Cardiovascular Report (Signed)
Donna [name], Donna [name]                ACCOUNT NO.:  [id number]   MEDICAL RECORD NO.:  [id number]          PATIENT TYPE:  INP   LOCATION:  2101                         FACILITY:  [facility]   PHYSICIAN:  Leslye Peer, MD       DATE OF BIRTH:  June 01, 1941   DATE OF PROCEDURE:  03/27/2006  DATE OF DISCHARGE:                              CARDIAC CATHETERIZATION   REFERRING PHYSICIAN:  Stroke team.   PROCEDURE:  Transesophageal echocardiogram.   INDICATIONS:  Stroke, to evaluate for embolic source.   OPERATOR:  Leslye Peer, M.D. with [facility].   COMPLICATIONS:  None immediate.   The patient was sedated with fentanyl 75 mcg IV and Versed 5 mg IV, then  intubated without difficulty.   RESULTS:  1. Left atrium was mildly dilated but no obvious clot or smoke was      appreciated.  2. Left atrial appendage was normal in size throughout.  Obvious clutter      smoke noted.  Exit velocities were normal at approximately 40-60 cm per      second.  3. The right atrium was normal in size without obvious clutter smoke      noted.  4. Contrast study was positive with minimal bubbles crossing the      interatrial septum suggestive of a small PFO.  Color flow Doppler      across the interatrial septum did not appreciate this.  5. The left ventricle is __________ size with normal left ventricular      systolic function.  6. The right ventricle is normal in size with normal right ventricular      systolic function.  7. The aortic valve is trileaflet and mildly thickened but with normal      leaflet excursion and normal function.  No aortic deficiency was noted.      No mass or vegetation noted.  8. Mitral valve was structurally normal with trace to mild mitral      regurgitation, but no mass or vegetation was noted.  9. Tricuspid valve was structurally normal.  10.Pulmonic valve was not well visualized but appeared to be grossly      normal.  11.The aorta revealed mild atheroma but no complex  plaque.  12.No obvious source of emboli noted.           ______________________________  Leslye Peer, MD    AB/MEDQ  D:  03/27/2006  T:  03/27/2006  Job:  [id number]   cc:   [facility]  Stroke team

## 2010-12-29 NOTE — H&P (Signed)
[name], [name]                ACCOUNT NO.:  [id number]   MEDICAL RECORD NO.:  [id number]          PATIENT TYPE:  INP   LOCATION:  [place]                         FACILITY:  [facility]   PHYSICIAN:  Donna [name], M.D.  DATE OF BIRTH:  06-02-1941   DATE OF ADMISSION:  04/02/2006  DATE OF DISCHARGE:                                HISTORY & PHYSICAL   HISTORY OF PRESENT ILLNESS:  Donna [name] is a 70 year old right black  female born 05-22-1941 with a history of problems with diabetes and  hypertension.  The patient was recently admitted to [facility] on  March 25, 2006 with onset of problems with confusion and speech  disturbance, right-sided numbness.  The patient was treated with t-PA and  underwent a cerebral angiogram.  The angiographic evaluation reveals no area  of luminal filling defects seen in the left middle cerebral artery  distribution, moderate stenosis of the distal basilar artery was seen.  The  patient underwent MRI scan of the brain that shows evidence of bicerebral  scattered infarcts in the high left parietal cortex, right parietal cortex  and right cerebellar hemisphere.  The patient underwent cardiac evaluation  which included a TEE study showing a very small insignificant PFO.  The  patient was discharged on aspirin and Aggrenox and is to be following up  with Dr. Leonie [name].  The patient, however, has had headaches on Aggrenox that  was particularly bad today.  The patient also felt that she had some  increased numbness on the right side, gait disturbance, slurred speech.  The  patient has not been sleeping well.  Has had episodes of increased heart  rate and some anxiety.  The patient comes in to the emergency room for an  evaluation.  CT scan of the head really shows no significant abnormalities.   PAST MEDICAL HISTORY:  1. Significant for history of bicerebral strokes with recent stroke      admission and workup.  2. Hypertension.  3.  Diabetes.  4. Patent foramen ovale.  5. Anxiety disorder.  6. History of hematuria.   MEDICATIONS:  1. Lantus insulin 15 units subcutaneously q.h.s.  2. Aggrenox 1 tablet a day for another week and then go to two a day.  3. Aspirin 81 mg a day for another week and then stop.  4. The patient is on Lotensin 20 mg daily.  5. Clonidine 0.2 mg b.i.d.  6. Hydrochlorothiazide 12.5 mg daily.  7. Sliding scale insulin.  8. Metformin 100 mg b.i.d.  9. Zocor 40 mg daily.   ALLERGIES:  The patient has an allergy to CODEINE.   HABITS:  Does not smoke or drink.   SOCIAL HISTORY:  This patient is living in the [place]  area.  The patient is widowed.  Does have several children.   FAMILY HISTORY:  Notable for hypertension.  Negative for heart disease.   REVIEW OF SYSTEMS:  Notable for no recent fevers or chills.  The patient  does note some headache, some right-sided neck pain.  Denies shortness of  breath,  chest pain.  Does have elevated heart rate.  No abdominal pain,  nausea, vomiting.  No trouble controlling the bowels or bladder.  No  blackout episodes.  The patient again reports some problems with slurred  speech and gait problems, staggering today.   PHYSICAL EXAMINATION:  VITAL SIGNS:  Blood pressure is 159/91, heart rate  77, respiratory rate 20, temperature is afebrile.  GENERAL:  This patient is a minimally obese black female who is alert and  cooperative at the time of examination.  HEENT:  Head is atraumatic.  Eyes:  Pupils are equal, round, and reactive to  light.  Disks are flat bilaterally.  NECK:  Supple.  No carotid bruits noted.  RESPIRATORY:  Clear.  CARDIOVASCULAR:  Regular rate and rhythm.  No obvious murmurs or rubs noted.  EXTREMITIES:  Without significant edema.  NEUROLOGICAL:  Cranial nerves II through XII intact as above.  Facial  symmetry is present.  The patient has good sensation to pinprick and soft  touch bilaterally.  Has good strength  to facial muscles, muscles with head  turning, shoulder shrug bilaterally.  Speech is well enunciated and not  aphasic.  Motor testing reveals 5/5 strength in all fours.  Good symmetric  motor tone is noted throughout.  Sensory testing is intact to pinprick, soft  touch and vibratory sensation throughout.  The patient has fair finger-nose-  finger, heel-to-shin.  Gait was not tested.  Romberg was not tested.  No  drift is seen.  Deep tendon reflexes remain symmetric.  Normal toes, neutral  bilaterally.   LABORATORY DATA:  Blood work is pending at this time.   IMPRESSION:  1. History of bicerebral strokes.  2. Hypertension.  3. Diabetes.  4. This patient has had punctate bicerebral strokes previously.  Cause of      the events are not clear. The patient is on Aggrenox and aspirin.  We      will bring this patient in for a brief evaluation.  There is no clear      new deficits on today's examination.   PLAN:  1. MRI scan of the brain:  If this is unremarkable, the patient will be      discharged to home.  2. We will continue aspirin and Aggrenox.  We will follow the patient's      clinical course while in-house.      Donna [name], M.D.  Electronically Signed     CKW/MEDQ  D:  04/02/2006  T:  04/03/2006  Job:  [id number]   cc:   [facility]  Ernestene Kiel, M.D.

## 2010-12-29 NOTE — Op Note (Signed)
[place]. [facility]  Patient:    Donna [name], Donna [name]                       MRN: [id number] Proc. Date: 01/22/01 Adm. Date:  [id number] Attending:  Juanita Craver CC:         Orpah Melter, M.D.   Operative Report  DATE OF BIRTH:  02/13/1941.  REFERRING PHYSICIAN:  Orpah Melter, M.D.  PROCEDURES PERFORMED:  Colonoscopy/endoscopy  INSTRUMENT USED:  Olympus video colonoscope.  INDICATIONS FOR PROCEDURE:  A 70 year old African-American female with a history of diarrhea.  Screening colonoscopy is being performed to rule out colonic polyps, masses, hemorrhoids, etc.  PREPROCEDURE PREPARATION:  Informed consent was procured from the patient. The patient was fasted for eight hours prior to procedure and prepped with a bottle of magnesium citrate and a gallon of NuLytely the night prior to the procedure.  PREPROCEDURE PHYSICAL:  VITAL SIGNS:  The patient had stable vital signs.  NECK:  Supple.  CHEST:  Clear to auscultation.  HEART:  S1 and S2, regular.  ABDOMEN:  Abdomen soft with normal bowel sounds.  DESCRIPTION OF PROCEDURE:  The patient was placed in the left lateral decubitus position and draped with an additional 25 mg of Demerol and 2.5 mg Versed intravenously.  Once the patient was adequately sedated and maintained on low flow oxygen, continuous cardiac monitoring, the Olympus video colonoscope was advanced from the rectum to the cecum.  With extreme difficulty secondary to large amount of residual seen in the colon, no masses or polyps were seen.  Small lesions could have been missed.  RECOMMENDATIONS: 1. Increase fiber in the diet. 2. Outpatient follow-up in the next four weeks. DD:  01/22/01 TD:  01/22/01 Job: [id number]

## 2010-12-29 NOTE — Cardiovascular Report (Signed)
[place]. [facility]  Patient:    Donna [name], Donna [name]                       MRN: [id number] Proc. Date: 06/11/00 Adm. Date:  [id number] Attending:  Camelia Phenes CC:         Cardiac Catheterization Laboratory  Bryson Dames, M.D.  [facility], [place] [address] [place], [place] [address]   Cardiac Catheterization  INDICATIONS:  [name] is a 70 year old black female referred by Dr. Melvern Banker for diagnostic coronary arteriography because of one month of symptoms compatible with unstable angina.  Risk factors include non-insulin requiring diabetes, hypertension and hyperlipidemia.  DESCRIPTION OF PROCEDURE:  The patient was brought to the [facility] second floor cardiac catheterization lab in the postabsorptive state.  She was premedicated with p.o. Valium and IV Benadryl.  Her right groin was prepped and shaved in the usual sterile fashion.  Xylocaine 1% was used for local anesthesia.  A 6 French sheath was inserted into the right femoral artery using standard Seldinger technique.  A 6 French right and left Judkins diagnostic catheters along with a 6 French pigtail catheter were used for selective coronary angiography, left ventriculography, and distal abdominal aortography.  Omnipaque dye was used for the entirety of the case. Retrograde, aortic, left ventricular and pullback pressures were recorded.  HEMODYNAMICS: 1. Aortic systolic pressure Q000111Q, diastolic pressure A999333. 2. Left ventricular systolic pressure 99991111, diastolic pressure 15.  SELECTIVE CORONARY ANGIOGRAPHY: 1. Left main:  Normal. 2. Left anterior descending:  The LAD had a long segmental 40-50% proximal    stenosis. 3. Left circumflex:  This vessel was free of significant disease. 4. Right coronary artery:  This is a dominant vessel and was free of    significant disease.  LEFT VENTRICULOGRAPHY:  The RAO left ventriculogram was performed using 25 cc of  Omnipaque dye 12 cc/sec.   The overall LVEF was estimated at greater than 60% without focal wall motion abnormalities.  DISTAL ABDOMINAL AORTOGRAPHY:  Distal abdominal aortogram was performed using 20 cc of Omnipaque dye at 20 cc/sec.  The renal arteries were widely patent. The infrarenal abdominal aorta and iliac bifurcation appeared free of significant atherosclerotic change.  IMPRESSION:  [name] has atypical chest pain, positive risk factors and noncritical coronary artery disease with a long segmental 40-50% proximal left anterior descending lesion that was mildly hypodensed.  While this may be hemodynamically significant and because of the length of the lesion, I believe outpatient Cardiolite stress testing and medical management is the best approach at this point.  The patient received 20 mg of IV labetalol prior to sheath removal.  The sheaths were removed and pressure was held on the groin to achieve hemostasis. The patient left the lab in stable condition.  She will be discharged home this evening and will follow up with Dr. Janene Madeira in approximately two weeks. DD:  06/11/00 TD:  06/11/00 Job: [id number]

## 2010-12-29 NOTE — Procedures (Signed)
[place]. [facility]  Patient:    Donna [name], Donna [name]                       MRN: [id number] Proc. Date: 01/22/01 Adm. Date:  [id number] Attending:  Juanita Craver CC:         Orpah Melter, M.D.   Procedure Report  DATE OF BIRTH:  1941/04/21  REFERRING PHYSICIAN:  Orpah Melter, M.D.  PROCEDURE PERFORMED:  Esophagogastroduodenoscopy.  ENDOSCOPIST:  Nelwyn Salisbury, M.D.  INSTRUMENT USED:  Olympus video panendoscope.  INDICATIONS FOR PROCEDURE:  Epigastric pain, nausea and vomiting in a 70 year old  African-American female who is diabetic, rule out peptic ulcer disease, esophagitis, gastritis, etc.  PREPROCEDURE PREPARATION:  Informed consent was procured from the patient. The patient was fasted for eight hours prior to the procedure.  PREPROCEDURE PHYSICAL:  The patient had stable vital signs.  Neck supple. Chest clear to auscultation.  S1, S2 regular.  Abdomen soft with normal abdominal bowel sounds.  DESCRIPTION OF PROCEDURE:  The patient was placed in left lateral decubitus position and sedated with 50 mg of Demerol and 5 mg of Versed intravenously. Once the patient was adequately sedated and maintained on low-flow oxygen and continuous cardiac monitoring, the Olympus video panendoscope was advanced through the mouthpiece, over the tongue, into the esophagus under direct vision.  The entire esophagus was widely patent and appeared healthy without evidence of ring, stricture masses, lesions, esophagitis or Barretts mucosa. The entire gastric mucosa appeared healthy except for mild antral gastritis. The proximal small bowel also appeared normal.  Retroflexion was not done as the patient became tachycardic during the procedure and the scope was withdrawn.  IMPRESSION: 1. Normal-appearing esophagus. 2. Mild antral gastritis. 3. Normal proximal small bowel. 4. Retroflexion not done because of patients tachycardia.  RECOMMENDATION: 1.  Proceed with colonoscopy once the patients tachycardia resolves. 2. The patient is to stop by the office to get some samples of Aciphex    20 mg 1 p.o. q.d. 3. Outpatient follow-up in the next four weeks. DD:  01/22/01 TD:  01/22/01 Job: [id number]

## 2010-12-29 NOTE — Procedures (Signed)
[facility]  Patient:    Donna [name], Donna [name]                       MRN: [id number] Proc. Date: 06/24/00 Adm. Date:  [id number] Attending:  Georgina Pillion                           Procedure Report  PREOPERATIVE DIAGNOSIS:  Gross hematuria.  POSTOPERATIVE DIAGNOSIS:  Gross hematuria.  PROCEDURE PERFORMED:  Cystoscopy, bladder biopsy, fulguration, bilateral retrograde pyelograms, and Foley catheter placement.  SURGEON:  Rana Snare, M.D.  ANESTHESIA:  General.  INDICATIONS:  Donna [name] is a 70 year old African-American female.  She has no urologic history.  She was recently hospitalized at [facility] for cardiac evaluation and underwent cardiac catheterization. She states that a Foley catheter was placed at that time.  She was subsequently discharged from the hospital, and several days later, she developed asymptomatic, severe gross hematuria.  She subsequently developed suprapubic pressure and inability to void.  She was seen in the [facility] emergency room.  There, she was noted to have grossly bloody urine but no evidence of obvious cystitis.  A Foley catheter was placed, and a number of clots were obtained.  A CT scan showed no obvious upper tract abnormalities, although this was a noncontrast CT scan.  She did have clot with questionable tumor in her bladder.  The patient was seen in our office the following day.  A number of bladder clots had to be evacuated.  She had diffuse oozing from her mucosa of her bladder, but I did not appreciate an obvious underlying tumor.  Her urine appeared to clear, and we discharged her with followup to have an additional evaluation, as well as culture to be sure she did not simply just have acute cystitis, which was hemorrhagic.  She represented the following day with complaints of suprapubic pressure.  She was unable to void and was found again to have significant clots in her bladder. We were able to evacuate the clots,  and again, we were unable to establish an obvious source of her hematuria. She presents now for further evaluation of her gross hematuria with fulguration of any obvious bleeding, as well as retrograde studies.  TECHNIQUE AND FINDINGS:  The patient was brought to operating room where had the successful induction of general endotracheal anesthesia.  The resectoscope sheath was placed in her bladder, and a number of clots were obtained.  On cystoscopy, she had some diffuse mucosa erythema and edema.  There were a number of hemorrhagic spots that appeared to be oozing.  It was unclear whether this was simply inflamed mucosa or whether this could be potentially some carcinoma in situ.  Two biopsies were taken of areas of maximal erythema, and these were then fulgurated.  The biopsy was performed with the resectoscope and a cutting loop.  Bilateral retrogrades were also performed. This showed normal caliber ureters without evidence of obstruction or obvious filling defects.  The urine coming down from both ureteral orifices appeared clear.  I did not see any obvious bladder tumor.  The findings on cystoscopy were consistent with an inflammatory process involving her bladder.  Whether this is cystitis of bacterial origin is not clear, and her cultures had been negative.  She may have a potentially unusual cystitis such as an eosinophilic cystitis as a reaction to something she may have gotten at the hospital.  The biopsies will  hopefully give [place] some insight and also rule out carcinoma in situ.  At the completion of the procedure, her urine looked quite clear, but we decided to leave an indwelling Foley catheter.  Of note, the patients hemoglobin preoperatively was 7.9, and she did receive 2 units of blood.  She remained stable and was brought to the recovery room in a stable condition. DD:  06/24/00 TD:  06/24/00 Job: [id number]

## 2010-12-29 NOTE — Cardiovascular Report (Signed)
NAMETERYL, OSTERHOUDT                ACCOUNT NO.:  [id number]   MEDICAL RECORD NO.:  [id number]          PATIENT TYPE:  INP   LOCATION:  2101                         FACILITY:  [facility]   PHYSICIAN:  Leslye Peer, MD       DATE OF BIRTH:  07/26/1941   DATE OF PROCEDURE:  03/27/2006  DATE OF DISCHARGE:                              CARDIAC CATHETERIZATION   ADDENDUM:  dictation [phone]   There is a small circumferential pericardial effusion without evidence of  hemodynamic compromise.           ______________________________  Leslye Peer, MD     AB/MEDQ  D:  03/27/2006  T:  03/27/2006  Job:  [id number]

## 2010-12-29 NOTE — Discharge Summary (Signed)
Donna [name], Donna [name]                ACCOUNT NO.:  [id number]   MEDICAL RECORD NO.:  [id number]          PATIENT TYPE:  INP   LOCATION:  3028                         FACILITY:  [facility]   PHYSICIAN:  Jill Alexanders, M.D.  DATE OF BIRTH:  [birth year]   DATE OF ADMISSION:  04/02/2006  DATE OF DISCHARGE:                                 DISCHARGE SUMMARY   DATE OF ADMISSION:  April 02, 2006   ADMISSION DIAGNOSES:  1. New onset of speech disturbance and gait disturbance.  Rule out      cerebrovascular infarction.  2. History of bicerebral strokes with recent hospital admission.  3. History of diabetes.  4. History of hypertension.   DISCHARGE DIAGNOSES:  1. Hyponatremia and hypokalemia thought secondary to hydrochlorothiazide.  2. History of bicerebral strokes.  3. Hypertension.  4. Diabetes.   PROCEDURES DURING THIS ADMISSION:  1. MRI scan of the brain.  2. CT scan of the brain.   COMPLICATIONS OF ABOVE PROCEDURES:  None.   HISTORY OF PRESENT ILLNESS:  Donna [name] is a 70 year old right-handed,  black female born Dec 23, 1940, with a history of hypertension and  diabetes, who was admitted to [facility] recently with bicerebral  embolic strokes.  Patient received TPA during that admission on March 25, 2006.  Under workup including the 2-D echocardiogram, transesophageal  echocardiogram, MRI of the brain, cerebral angiogram.  Patient was found to  have a small patent foramen ovale that was not felt to be significant.  The  patient undergone a hypercoagulable state workup, which is not yet  available.  Patient returns to the hospital on April 02, 2006, after being  discharged on March 29, 2006, with worsening problems with gait, slurred  speech, increased headache.  Patient was admitted to insure that a recurring  stroke had not happened.   PAST MEDICAL HISTORY:  Past medical history is significant for:  1. History of bicerebral strokes.  2. Hypertension.  3.  Diabetes.  4. Patent foramen ovale.  5. Anxiety disorder.  6. History of hematuria in the past.  7. Hyponatremia and hypokalemia as above.   MEDICATIONS PRIOR TO ADMISSION:  1. Lantus insulin 15 units subcutaneous q.h.s.  2. Aggrenox 1 tablet a day for another week or so and then go to 2 a day.  3. Aspirin 81 mg a day for a week and then discontinue.  4. Lotensin 20 mg a day.  5. Clonidine 0.2 mg twice a day.  6. Hydrochlorothiazide 12.5 mg daily.  7. Metformin 1000 mg twice a day.  8. Zocor 40 mg a day.   ALLERGIES:  Patient has allergies to CODEINE.   SOCIAL HISTORY:  Does not smoke or drink.   REVIEW OF SYSTEMS:  Please refer to history and physical dictation signed  for social history, family history, review of systems, physical examination.   LABORATORY VALUES:  Laboratory values during this admission included sodium  of 123, potassium 2.8 on admission, chloride of 85, CO2 of 28, glucose 96,  BUN of 29, creatinine 1.5, calcium 8.7, total protein 6.8, albumin 3.5,  AST  of 22, ALT is 13, alkaline phosphatase of 42, total bili 0.5, white count of  7.2, hemoglobin 11.4, hematocrit 34.1, MCV of 90.6, platelets of 429.  A  repeat sodium level on the day of discharge was 134, potassium 4.8,  creatinine down to 1.3.   HOSPITAL COURSE:  This patient was admitted to [facility] for  further evaluation.  CT scan of the head done through the emergency room was  unremarkable.  Patient was brought into the hospital to evaluate for a  possible recurring stroke event.  Admission blood work, however, reveals a  potential cause for her new symptoms.  Patient was taken off of  hydrochlorothiazide and placed on IV normal saline at 100 mL an hour.  Patient was given oral supplementation with potassium of 40 mEq twice a day.  On the day of discharge, sodium levels up to 134, potassium 4.8.  An MRI  study of the brain was obtained and did not show any new acute cerebral  vascular  infarctions.  At this point, patient is doing quite well.  The  headache is essentially gone.  Patient does report some visual obscurations,  however.  Patient will be discharged on Aggrenox 1 tablet a day for a week,  then go to 1 twice a day.  Aspirin 81 mg a day for 1 week, then discontinue.  Lotensin 20 mg a day.  Clonidine 0.2 mg twice a day.  Lantus insulin 15  units subcutaneous q.h.s.  Metformin 1000 mg twice a day.  Zocor 40 mg daily  and patient will be placed on Foltx with an elevated homocystine level 1  daily.  The patient will come off of the hydrochlorothiazide.  Patient will  follow up with [facility] and see Dr. Leonie Man within 4-6  weeks and follow up with Dr. Mare Ferrari for routine diabetes and blood pressure  management.  Again, at the time of discharge, the patient is quite alert,  cooperative, fully ambulatory.  No focal neurologic deficits are noted on  exam.  Patient, again, will be on an 1800-calorie ADA, no added salt diet.      Jill Alexanders, M.D.  Electronically Signed     CKW/MEDQ  D:  04/04/2006  T:  04/04/2006  Job:  [id number]   cc:   Melburn Hake, M.D.  [facility]

## 2010-12-29 NOTE — Discharge Summary (Signed)
Donna [name]. [facility]  Patient:    Donna [name], Donna [name]                       MRN: [id number] Adm. Date:  [id number] Disc. Date: [id number] Attending:  Camelia Phenes Dictator:   Alroy Bailiff, P.A. CC:         Orpah Melter, M.D.  Elvia Collum, M.D.   Discharge Summary  PRIMARY CARE PHYSICIAN:  Orpah Melter, M.D.  ADMISSION DIAGNOSES: 1. Chest pain of questionable etiology. 2. History of hypertension. 3. History of anxiety. 4. History of hyperlipidemia. 5. History of non-insulin-dependent diabetes mellitus.  DISCHARGE DIAGNOSES: 1. Chest pain of questionable etiology. 2. History of hypertension. 3. History of anxiety. 4. History of hyperlipidemia. 5. History of non-insulin-dependent diabetes mellitus. 6. Status post cardiac catheterization on June 11, 2000, revealing    segmental 40-50% proximal left anterior descending stenosis.  At that time,    she was recommended for medical therapy and outpatient Cardiolite. 7. 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  Post cardiac    catheterization she developed headache and visual changes.  A follow-up    neurologic consult and magnetic resonance angiography were performed as    below.  HISTORY OF PRESENT ILLNESS:  Donna [name] is a 70 year old African-American female who was sent to the office to be seen by Bryson Dames, M.D., on May 27, 2000, as a referral from Orpah Melter, M.D., for further cardiac evaluation of complaints of chest pain.  She stated that she had been having chest pain for the past few weeks and that they had been occurring almost daily.  It comes on in the left breast area and then just to the right to the sternum.  She feels it more with activity.  It lasts for several minutes and then goes away when she rests.  She did not have any nitroglycerin to take for it.  She does take an aspirin a day.  She denies any knowledge of previous heart  attack.  On exam at that time, her blood pressure was 130/96 and pulse 98.  Her exam was essentially.  EKG showed normal sinus rhythm with a Q wave in lead 3.  Otherwise the EKG was normal.  At that time, Bryson Dames, M.D., planned to obtain a 2-D echocardiogram and add Toprol 50 mg to her medicines.  As well, he would plan with cardiac catheterization to be performed as an outpatient.  On June 11, 2000, Donna [name] underwent diagnostic cardiac catheterization by Lorretta Harp, M.D.  (This was performed by Dr. Gwenlyn Found rather than Bryson Dames, M.D., for scheduling purposes.)  She was found to have the following:  1. Left main normal.  2. The LAD had a long segmental 40-50% proximal stenosis.  3. Left circumflex free of significant disease. 4. Right coronary artery dominant vessel and free of significant disease. Left ventriculography showed EF to be greater than 60% with no focal wall motion abnormalities.  Distal abdominal aortogram showed that the renal arteries were widely patent.  The infrarenal abdominal aorta and iliac bifurcation appeared free of significant change.  At that time, it was felt that Donna [name] had atypical chest pain, but did have positive risk factors and noncritical CAD with a long segmental 40-50% proximal left anterior descending lesion that was mildly hypodense.  While this may be hemodynamically significant and because of the length of the lesion, Dr. Gwenlyn Found felt that an outpatient Cardiolite  stress test and medical management was the best approach at that point.  At that time, she had been planned for discharge home later that day once her bed rest was complete.  At 4:40 p.m. that afternoon, Donna [name] was seen for discharge planning. However, at that time, she had complaints of visual changes and right arm numbness.  She stated that she was having loss of parts of her visual field. As well, she was seeing some objects double.  She had 5/5  strength in the right hand grip and 5/5 right upper extremity strength.  There were no complaints of weakness or numbness of the right lower extremity.  She stated that she did have a history of migraine headaches as a child, at which time she had similar visual changes.  However, she had not had such migraines in a couple of years.  At that time, she did also have complaints of a headache in addition to the visual field defects and numbness in the right upper extremity.  She states that ever since the catheterization and later that day, she had had some visual changes on and off all day.  Lorretta Harp, M.D., was then called and was informed of these above findings.  Therefore, we planned to obtain a neurology consultation and admit her to the hospital for further evaluation.  HOSPITAL COURSE:  On June 11, 2000, Donna [name] seen in neurologic consultation by Elvia Collum, M.D.  At that time, he planned for an MRI/MRA of the brain that night.  He planned symptomatic treatment for migraine.  He would continue aspirin q.d. and would follow up after imaging.  On June 12, 2000, she was seen again by Elvia Collum, M.D., in follow-up neurology.  He felt that given the persistent symptoms, especially with resolution of migraine nature, he was very concerned that she may have had an embolic stroke during the catheterization.  He was going to check the MRI and MRA results.  The MRI showed new and acute left posterior parietal lobe infarct.  Her exam was much improved that morning, but still a little restricted in the right upper fields.  He felt that we should resume previous antiplatelet therapy.  He advised her to avoid driving in the immediate future until her visual changes cleared.  If they do not resolve entirely, she may need occupational therapy evaluation in the future before resuming driving. Otherwise, she could resume normal activities and was stable for  discharge from their standpoint.   On June 13, 2000, Donna [name] remained stable.  Her weakness was resolved. She still had visual deficits.  She was on aspirin and Plavix.  She was also seen by speech pathology and physical therapy later on June 13, 2000.  Speech pathology felt that no speech therapy was warranted at this time.  Physical therapy felt that she may benefit from some outpatient therapy.  However, the patient stated "I dont have time for that."  On June 14, 2000, Ms. Henninger was stable and was felt to be ready for discharge home.  Her temperature was 96.9 degrees, blood pressure 110-120/80, pulse 78, and oxygen saturation 99% on room air.  Her exam was essentially benign with no edema and no hematoma of the groin.  She had equal strong grips bilaterally and all extremities moved equally.  No gross abnormality of her cranial nerve function.  She was felt stable for discharge home at this time.  HOSPITAL PROCEDURES:  Cardiac catheterization on June 11, 2000, by  Lorretta Harp, M.D.  This revealed left main normal.  The left anterior descending artery had a long segmental 40-50% proximal stenosis.  The left circumflex was free of significant disease.  The right coronary artery was dominant and free of significant disease.  Left ventriculography showed EF greater than 60%. Distal aortography showed patent renal arteries.  The infrarenal abdominal aorta and iliac bifurcation appeared free of significant disease.  At that time, it was felt that Ms. Bohac had atypical chest pain with positive risk factors and noncritical CAD with a long segmental 40-50% proximal LAD lesion that was mildly hypodense.  While this may be hemodynamically significant and because of the length of the lesion, it was felt that an outpatient Cardiolite stress test and medical management were the best approach for the patient.  HOSPITAL CONSULTS:  Neurology consultation by Elvia Collum,  M.D., on June 11, 2000.  At that time, he felt that the patient was experiencing right hemivisual changes and hemisensory changes with a history of migraine in the setting of post cardiac catheterization.  Given that these were some stereotype and so much like her previous events, these strongly suggest migraine.  However, given the fact that she just had cardiac catheterization, we need to exclude left occipital stroke.  At that time, he recommend MRI/MRA to be performed that night.  He will continue aspirin q.d.  He planned symptomatic treatment for migraine and follow-up imaging.  RADIOLOGY:  MRI of the brain on June 12, 2000, showed small nonhemorrhagic infarct in the posterior left parietal lobe.  Baseline mild to slightly moderate paraventricular and subcortical nonspecific white matter-type changes.  Considerations would include changes related to:  Small vessel disease, demyelinating process, inflammatory process with secondary to underlying vasculitis.  I suspect that given the present clinical setting, the area represent small vessel disease-type changes.  There was no evidence of hydrocephalus or midline shift.  No evidence of intracranial hemorrhage. Overall Impression:  Small acute nonhemorrhagic infarct, posterior left parietal lobe.  Mild to slightly moderate nonspecific white matter-type changes probably represent small vessel disease.  EKG from May 27, 2000, showed normal sinus rhythm at 98 beats per minute with no ST-T change.  HOSPITAL LABORATORIES:  On June 12, 2000, CBC showed WBC 6.2, hemoglobin 10.8, hematocrit 32.0, and platelets 284.  The BMP at that time showed sodium 141, potassium 3.4, chloride 103, glucose 164, BUN 20, and creatinine 1.3.  DISCHARGE MEDICATIONS: 1. Toprol XL 50 mg one p.o. q.d. 2. Zocor 20 mg one p.o. q.d. at bedtime. 3. Lotensin HCT 10/12.5 mg one p.o. b.i.d. 4. Plavix 75 mg one p.o. q.d. 5. Enteric-coated aspirin 325 mg  one p.o. q.d. 6. Lorazepam 2 mg one p.r.n. 7. Meprobamate 400 mg two tablets two hours before bedtime. 8. Glucophage 1000 mg one p.o. b.i.d.  Do not take this until June 16, 2000.  You can start retaking this on Sunday, June 16, 2000.  ACTIVITY:  No driving until vision clears.  DIET:  Low-salt, low-fat diet.  FOLLOW-UP:  She is scheduled for a stress test in our office on July 01, 2000, at 12 p.m.  She has been instructed on the following:  Do not take Toprol the day before or the day of the test.  Nothing to eat after midnight. No ______.  No caffeine or decaffeinated coffee or sodas.  Comfortable clothes and shoes.  She is to have blood drawn on June 17, 2000, to check a BMP.  She is  to follow up with Bryson Dames, M.D., in the office on July 12, 2000, at 11:45 a.m.  She has an appointment to follow up with Elvia Collum, M.D., from neurology on August 05, 2000, at 8 a.m. DD:  06/14/00 TD:  06/14/00 Job: [id number]

## 2011-05-11 LAB — CARDIAC PANEL(CRET KIN+CKTOT+MB+TROPI)
CK, MB: 2.6
Relative Index: 1
Relative Index: 1.3
Total CK: 254 — ABNORMAL HIGH

## 2011-05-11 LAB — URINE CULTURE: Colony Count: >=100000

## 2011-05-11 LAB — CBC
HCT: 30.7 — ABNORMAL LOW
Hemoglobin: 11.6 — ABNORMAL LOW
Hemoglobin: 10.1 — ABNORMAL LOW
MCHC: 32.1
MCHC: 32.8
MCV: 91.8
Platelets: 309
RBC: 3.34 — ABNORMAL LOW
RDW: 14.2
RDW: 14.7
WBC: 5.3

## 2011-05-11 LAB — COMPREHENSIVE METABOLIC PANEL
ALT: 9
AST: 17
Albumin: 2.9 — ABNORMAL LOW
Calcium: 8.7
GFR calc Af Amer: 51 — ABNORMAL LOW
Glucose, Bld: 158 — ABNORMAL HIGH
Sodium: 139
Total Protein: 6

## 2011-05-11 LAB — URINALYSIS, ROUTINE W REFLEX MICROSCOPIC
Bilirubin Urine: NEGATIVE
Glucose, UA: >1000 — AB
Hgb urine dipstick: NEGATIVE
Ketones, ur: NEGATIVE
Leukocytes, UA: NEGATIVE
Nitrite: NEGATIVE
Protein, ur: 30 — AB
Specific Gravity, Urine: 1.026
Urobilinogen, UA: 1
pH: 6

## 2011-05-11 LAB — BASIC METABOLIC PANEL
BUN: 10
CO2: 26
CO2: 27
CO2: 29
Calcium: 8.9
Chloride: 99
Chloride: 102
Creatinine, Ser: 1.19
Creatinine, Ser: 1.28 — ABNORMAL HIGH
GFR calc Af Amer: 50 — ABNORMAL LOW
GFR calc non Af Amer: 46 — ABNORMAL LOW
Glucose, Bld: 321 — ABNORMAL HIGH
Glucose, Bld: 364 — ABNORMAL HIGH
Glucose, Bld: 169 — ABNORMAL HIGH
Potassium: 3 — ABNORMAL LOW
Sodium: 137

## 2011-05-11 LAB — URINE MICROSCOPIC-ADD ON

## 2011-05-11 LAB — CK TOTAL AND CKMB (NOT AT ARMC)
CK, MB: 2.5
Relative Index: 1.2
Relative Index: 1
Total CK: 239 — ABNORMAL HIGH

## 2011-05-11 LAB — CULTURE, BLOOD (ROUTINE X 2)
Culture: NO GROWTH
Culture: NO GROWTH

## 2011-05-11 LAB — HEMOGLOBIN A1C
Hgb A1c MFr Bld: 11.7 — ABNORMAL HIGH
Mean Plasma Glucose: 339

## 2011-05-11 LAB — DIFFERENTIAL
Basophils Absolute: 0
Basophils Relative: 0
Monocytes Absolute: 0.4
Neutro Abs: 5.2

## 2011-05-11 LAB — TROPONIN I

## 2011-05-11 LAB — TSH: TSH: 2.055

## 2011-12-12 ENCOUNTER — Encounter (INDEPENDENT_AMBULATORY_CARE_PROVIDER_SITE_OTHER): Payer: Medicare Other | Admitting: Ophthalmology

## 2011-12-12 DIAGNOSIS — H4310 Vitreous hemorrhage, unspecified eye: Secondary | ICD-10-CM

## 2011-12-12 DIAGNOSIS — H43819 Vitreous degeneration, unspecified eye: Secondary | ICD-10-CM

## 2011-12-12 DIAGNOSIS — E11359 Type 2 diabetes mellitus with proliferative diabetic retinopathy without macular edema: Secondary | ICD-10-CM

## 2011-12-12 DIAGNOSIS — I10 Essential (primary) hypertension: Secondary | ICD-10-CM

## 2011-12-12 DIAGNOSIS — E1139 Type 2 diabetes mellitus with other diabetic ophthalmic complication: Secondary | ICD-10-CM

## 2011-12-12 DIAGNOSIS — H2510 Age-related nuclear cataract, unspecified eye: Secondary | ICD-10-CM

## 2011-12-12 DIAGNOSIS — E11319 Type 2 diabetes mellitus with unspecified diabetic retinopathy without macular edema: Secondary | ICD-10-CM

## 2011-12-13 ENCOUNTER — Other Ambulatory Visit: Payer: Self-pay | Admitting: Nephrology

## 2011-12-19 ENCOUNTER — Ambulatory Visit
Admission: RE | Admit: 2011-12-19 | Discharge: 2011-12-19 | Disposition: A | Discharge status: Home / Self Care | Payer: Medicare Other | Source: Ambulatory Visit | Attending: Nephrology | Admitting: Nephrology

## 2011-12-21 ENCOUNTER — Ambulatory Visit (INDEPENDENT_AMBULATORY_CARE_PROVIDER_SITE_OTHER): Payer: Medicare Other | Admitting: Ophthalmology

## 2011-12-21 DIAGNOSIS — H3581 Retinal edema: Secondary | ICD-10-CM

## 2012-04-23 ENCOUNTER — Ambulatory Visit (INDEPENDENT_AMBULATORY_CARE_PROVIDER_SITE_OTHER): Payer: Medicare Other | Admitting: Ophthalmology

## 2012-06-18 ENCOUNTER — Ambulatory Visit (INDEPENDENT_AMBULATORY_CARE_PROVIDER_SITE_OTHER): Payer: Medicare Other | Admitting: Ophthalmology

## 2012-07-07 ENCOUNTER — Other Ambulatory Visit: Payer: Self-pay | Admitting: Internal Medicine

## 2012-07-07 DIAGNOSIS — Z1231 Encounter for screening mammogram for malignant neoplasm of breast: Secondary | ICD-10-CM

## 2012-08-15 ENCOUNTER — Ambulatory Visit: Payer: Medicare Other

## 2012-09-11 ENCOUNTER — Ambulatory Visit: Payer: Medicare Other

## 2012-11-11 ENCOUNTER — Telehealth: Payer: Self-pay | Admitting: Oncology

## 2012-11-11 NOTE — Telephone Encounter (Signed)
C/D 11/11/12 for appt.12/10/12

## 2012-11-11 NOTE — Telephone Encounter (Signed)
LVOM FOR PT TO RETURN CALL.  °

## 2012-11-11 NOTE — Telephone Encounter (Signed)
S/w pt in re to NP appt 04/30 @ 10:30 w/Dr. 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

## 2012-12-04 ENCOUNTER — Other Ambulatory Visit: Payer: Self-pay | Admitting: Oncology

## 2012-12-04 DIAGNOSIS — D472 Monoclonal gammopathy: Secondary | ICD-10-CM

## 2012-12-09 ENCOUNTER — Telehealth: Payer: Self-pay | Admitting: *Deleted

## 2012-12-09 NOTE — Telephone Encounter (Signed)
Called to remind pt of appt 12/10/12. Pt advised her son is dying and she is waiting on a call from [place] and will not be able to make it tomorrow. On Tx Sched to scheduling to reschedule pt appt. Notified pt to expect a call from scheduling regarding new appt date/time.

## 2012-12-10 ENCOUNTER — Ambulatory Visit: Payer: Medicare Other | Admitting: Oncology

## 2012-12-10 ENCOUNTER — Other Ambulatory Visit: Payer: Medicare Other | Admitting: Lab

## 2012-12-10 ENCOUNTER — Ambulatory Visit: Payer: Medicare Other

## 2012-12-10 ENCOUNTER — Telehealth: Payer: Self-pay | Admitting: *Deleted

## 2012-12-10 NOTE — Telephone Encounter (Signed)
Printed pof and gv to [employer]

## 2013-11-16 ENCOUNTER — Telehealth: Payer: Self-pay | Admitting: Oncology

## 2013-11-16 NOTE — Telephone Encounter (Signed)
S/W PATIENT AND GAVE NEW PATIENT APPT FOR 04/24 @ 10:30 W/DR. SHADAD.  REFERRING DR. Ollen Gross POWELL DX- CKD/SLIGHTLY ABN FREE LIGHT CHAIN WELCOME PACKET MAILED.

## 2013-11-17 ENCOUNTER — Telehealth: Payer: Self-pay | Admitting: Oncology

## 2013-11-17 NOTE — Telephone Encounter (Signed)
C/D 11/17/13 for appt. 12/04/13

## 2013-11-27 ENCOUNTER — Other Ambulatory Visit: Payer: Self-pay | Admitting: Oncology

## 2013-11-27 DIAGNOSIS — D472 Monoclonal gammopathy: Secondary | ICD-10-CM

## 2013-12-04 ENCOUNTER — Telehealth: Payer: Self-pay | Admitting: Oncology

## 2013-12-04 ENCOUNTER — Encounter: Payer: Self-pay | Admitting: Oncology

## 2013-12-04 ENCOUNTER — Ambulatory Visit (HOSPITAL_BASED_OUTPATIENT_CLINIC_OR_DEPARTMENT_OTHER): Payer: Medicare Other | Admitting: Oncology

## 2013-12-04 ENCOUNTER — Ambulatory Visit (HOSPITAL_BASED_OUTPATIENT_CLINIC_OR_DEPARTMENT_OTHER): Payer: Medicare Other

## 2013-12-04 ENCOUNTER — Other Ambulatory Visit (HOSPITAL_BASED_OUTPATIENT_CLINIC_OR_DEPARTMENT_OTHER): Payer: Medicare Other

## 2013-12-04 VITALS — BP 137/74 | HR 64 | Temp 97.6°F | Resp 18 | Ht 66.5 in | Wt 181.8 lb

## 2013-12-04 DIAGNOSIS — D472 Monoclonal gammopathy: Secondary | ICD-10-CM

## 2013-12-04 DIAGNOSIS — N189 Chronic kidney disease, unspecified: Secondary | ICD-10-CM

## 2013-12-04 LAB — CBC WITH DIFFERENTIAL/PLATELET
BASO%: 1.1 % (ref 0.0–2.0)
BASOS ABS: 0.1 10*3/uL (ref 0.0–0.1)
EOS%: 5.4 % (ref 0.0–7.0)
Eosinophils Absolute: 0.3 10*3/uL (ref 0.0–0.5)
HEMATOCRIT: 39.3 % (ref 34.8–46.6)
HEMOGLOBIN: 12.8 g/dL (ref 11.6–15.9)
LYMPH#: 2.4 10*3/uL (ref 0.9–3.3)
LYMPH%: 45.9 % (ref 14.0–49.7)
MCH: 31.1 pg (ref 25.1–34.0)
MCHC: 32.5 g/dL (ref 31.5–36.0)
MCV: 95.8 fL (ref 79.5–101.0)
MONO#: 0.6 10*3/uL (ref 0.1–0.9)
MONO%: 11 % (ref 0.0–14.0)
NEUT#: 1.9 10*3/uL (ref 1.5–6.5)
NEUT%: 36.6 % — AB (ref 38.4–76.8)
Platelets: 256 10*3/uL (ref 145–400)
RBC: 4.11 10*6/uL (ref 3.70–5.45)
RDW: 14.8 % — ABNORMAL HIGH (ref 11.2–14.5)
WBC: 5.3 10*3/uL (ref 3.9–10.3)

## 2013-12-04 LAB — COMPREHENSIVE METABOLIC PANEL (CC13)
ALK PHOS: 49 U/L (ref 40–150)
ALT: 13 U/L (ref 0–55)
AST: 22 U/L (ref 5–34)
Albumin: 4 g/dL (ref 3.5–5.0)
Anion Gap: 11 mEq/L (ref 3–11)
BILIRUBIN TOTAL: 0.43 mg/dL (ref 0.20–1.20)
BUN: 26.1 mg/dL — AB (ref 7.0–26.0)
CALCIUM: 9.9 mg/dL (ref 8.4–10.4)
CHLORIDE: 107 meq/L (ref 98–109)
CO2: 22 mEq/L (ref 22–29)
CREATININE: 1.6 mg/dL — AB (ref 0.6–1.1)
Glucose: 110 mg/dl (ref 70–140)
Potassium: 3.8 mEq/L (ref 3.5–5.1)
Sodium: 141 mEq/L (ref 136–145)
Total Protein: 7.8 g/dL (ref 6.4–8.3)

## 2013-12-04 NOTE — Progress Notes (Signed)
Please see consult note.  

## 2013-12-04 NOTE — Consult Note (Signed)
Hematology and Oncology Follow Up Visit  Donna Green [id number] March 24, 1941 73 y.o. 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   Current therapy: Observation and surveillance.  Interim History:  This is a pleasant 73 year old woman with chronic renal insufficiency and a monoclonal gammopathy that I have evaluated in the past in 2011 for the same issue. Patient was referred to me again for reevaluation as of late due to increase in her kappa to lambda light chain ratio. Clinically she is asymptomatic and continue to be about the same since last time I saw her. She has not reported any pathological fractures. Has not reported any opportunistic infections. She does report the right lower leg pain which is have been chronic in nature. To call multiple imaging studies without any evidence of any bone lesions.  Medications: I have reviewed the patient's current medications.  Current Outpatient Prescriptions  Medication Sig Dispense Refill  . amLODipine (NORVASC) 10 MG tablet Take 10 mg by mouth daily.      Marland Kitchen dipyridamole-aspirin (AGGRENOX) 200-25 MG per 12 hr capsule Take 1 capsule by mouth 2 (two) times daily.      Marland Kitchen ibandronate (BONIVA) 150 MG tablet Take 150 mg by mouth once a week. Take in the morning with a full glass of water, on an empty stomach, and do not take anything else by mouth or lie down for the next 30 min.      . insulin aspart (NOVOLOG FLEXPEN) 100 UNIT/ML FlexPen Inject into the skin 3 (three) times daily with meals. Sliding scale      . insulin glargine (LANTUS) 100 UNIT/ML injection Inject 34 Units into the skin daily.      Marland Kitchen LORazepam (ATIVAN) 1 MG tablet Take 1 mg by mouth 2 (two) times daily.      .  valsartan-hydrochlorothiazide (DIOVAN-HCT) 320-25 MG per tablet Take 1 tablet by mouth daily.       No current facility-administered medications for this visit.     Allergies:  Allergies  Allergen Reactions  . Allopurinol   . Codeine   . Colcrys [Colchicine]     Past Medical History, Surgical history, Social history, and Family History were reviewed and updated.  Review of Systems: Constitutional:  Negative for fever, chills, night sweats, anorexia, weight loss, pain. Cardiovascular: no chest pain or dyspnea on exertion Respiratory: no cough, shortness of breath, or wheezing Neurological: no TIA or stroke symptoms Dermatological: negative for pruritus and rash ENT: negative for - epistaxis or sore throat Skin: Negative. Gastrointestinal: no abdominal pain, change in bowel habits, or black or bloody stools Genito-Urinary: negative Hematological and Lymphatic: negative for - bruising, fatigue or swollen lymph nodes Breast: negative Musculoskeletal: negative Remaining ROS negative. Physical Exam: Blood pressure 137/74, pulse 64, temperature 97.6 F (36.4 C), temperature source Oral, resp. rate 18, height 5' 6.5" (1.689 m), weight 181 lb 12.8 oz (82.464 kg). ECOG: 1 General appearance: alert and cooperative Head: Normocephalic, without obvious abnormality, atraumatic Neck: no adenopathy, no carotid bruit, no JVD, supple, symmetrical, trachea midline and thyroid not enlarged, symmetric, no tenderness/mass/nodules Lymph nodes: Cervical, supraclavicular, and axillary nodes normal. Heart:regular rate and rhythm, S1, S2 normal, no murmur, click, rub or gallop Lung:chest clear, no wheezing, rales, normal symmetric air entry Abdomin: soft, non-tender,  without masses or organomegaly EXT:no erythema, induration, or nodules   Lab Results: Lab Results  Component Value Date   WBC 5.3 12/04/2013   HGB 12.8 12/04/2013   HCT 39.3 12/04/2013   MCV 95.8 12/04/2013   PLT 256 12/04/2013      Chemistry      Component Value Date/Time   NA 139 08/09/2010 1343   K 4.2 08/09/2010 1343   CL 104 08/09/2010 1343   CO2 25 08/09/2010 1343   BUN 20 08/09/2010 1343   CREATININE 1.43* 08/09/2010 1343      Component Value Date/Time   CALCIUM 9.2 08/09/2010 1343   ALKPHOS 68 08/09/2010 1343   AST 35 08/09/2010 1343   ALT 35 08/09/2010 1343   BILITOT 0.4 08/09/2010 1343     Serum light chains on 11/11/2013 showed a slight increase free kappa of 32.2 normal is 19 with a kappa to lambda ratio 1.7.   Impression and Plan:  This is a 73 year old with both elevated IgA and IgG by quantitative immunoglobulin with immunofixation picking up an IgG lambda diagnosed in 2011.   She does not have any measurable M-spike by serum protein electroprosthesis at that time.  I am not see any evidence to suggest end organ damage.  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 Her chronic renal insufficiency is likely unrelated and continue to be stable. I do not see any clear-cut evidence of plasma cell disorder.  I think, for the time being observation and surveillance would be warranted.  I would like to repeat her serum protein electrophoresis today and probably annually after that.  If she should develop any cytopenias or any evidence of any end organ damage then I will restage her with a bone survey and a bone marrow biopsy.     Wyatt Portela, MD 4/24/201511:27 AM

## 2013-12-04 NOTE — Telephone Encounter (Signed)
Gave pt appt for lab and MD for 2016  °

## 2013-12-09 LAB — SPEP & IFE WITH QIG
ALBUMIN ELP: 58.8 % (ref 55.8–66.1)
ALPHA-1-GLOBULIN: 3.8 % (ref 2.9–4.9)
Alpha-2-Globulin: 7.8 % (ref 7.1–11.8)
BETA 2: 5.4 % (ref 3.2–6.5)
Beta Globulin: 6.5 % (ref 4.7–7.2)
Gamma Globulin: 17.7 % (ref 11.1–18.8)
IGA: 429 mg/dL — AB (ref 69–380)
IGM, SERUM: 93 mg/dL (ref 52–322)
IgG (Immunoglobin G), Serum: 1520 mg/dL (ref 690–1700)
M-Spike, %: 0.33 g/dL
TOTAL PROTEIN, SERUM ELECTROPHOR: 7.7 g/dL (ref 6.0–8.3)

## 2013-12-09 LAB — KAPPA/LAMBDA LIGHT CHAINS
KAPPA FREE LGHT CHN: 2.87 mg/dL — AB (ref 0.33–1.94)
KAPPA LAMBDA RATIO: 1.5 (ref 0.26–1.65)
Lambda Free Lght Chn: 1.91 mg/dL (ref 0.57–2.63)

## 2014-07-02 ENCOUNTER — Telehealth: Payer: Self-pay | Admitting: Oncology

## 2014-07-02 NOTE — Telephone Encounter (Signed)
Lvm advising appt chg from 4/26 ([facility]) to 4/27 @ 2.30pm. Also mailed revised appt calendar.

## 2014-12-06 ENCOUNTER — Telehealth: Payer: Self-pay | Admitting: Oncology

## 2014-12-06 NOTE — Telephone Encounter (Signed)
Pt called to cancel labs/ov due to an emergency in the family, will c/b to r/s .... KJ

## 2014-12-07 ENCOUNTER — Other Ambulatory Visit: Payer: Medicare Other

## 2014-12-07 ENCOUNTER — Ambulatory Visit: Payer: Medicare Other | Admitting: Oncology

## 2014-12-08 ENCOUNTER — Other Ambulatory Visit: Payer: Medicare Other

## 2014-12-08 ENCOUNTER — Ambulatory Visit: Payer: Medicare Other | Admitting: Oncology

## 2014-12-16 ENCOUNTER — Encounter (HOSPITAL_COMMUNITY): Payer: Self-pay | Admitting: Emergency Medicine

## 2014-12-16 ENCOUNTER — Emergency Department (HOSPITAL_COMMUNITY)
Admission: EM | Admit: 2014-12-16 | Discharge: 2014-12-17 | Disposition: A | Discharge status: Home / Self Care | Payer: Medicare Other | Attending: Emergency Medicine | Admitting: Emergency Medicine

## 2014-12-16 ENCOUNTER — Encounter (HOSPITAL_COMMUNITY): Payer: Self-pay | Admitting: *Deleted

## 2014-12-16 ENCOUNTER — Emergency Department (HOSPITAL_COMMUNITY)
Admission: EM | Admit: 2014-12-16 | Discharge: 2014-12-16 | Disposition: A | Discharge status: Nursing Home (Includes ICF) | Payer: Medicare Other | Source: Home / Self Care | Attending: Family Medicine | Admitting: Family Medicine

## 2014-12-16 ENCOUNTER — Emergency Department (HOSPITAL_COMMUNITY): Payer: Medicare Other

## 2014-12-16 DIAGNOSIS — S0990XA Unspecified injury of head, initial encounter: Secondary | ICD-10-CM

## 2014-12-16 DIAGNOSIS — I10 Essential (primary) hypertension: Secondary | ICD-10-CM | POA: Diagnosis not present

## 2014-12-16 DIAGNOSIS — Z8673 Personal history of transient ischemic attack (TIA), and cerebral infarction without residual deficits: Secondary | ICD-10-CM | POA: Insufficient documentation

## 2014-12-16 DIAGNOSIS — R51 Headache: Secondary | ICD-10-CM | POA: Insufficient documentation

## 2014-12-16 DIAGNOSIS — Z8719 Personal history of other diseases of the digestive system: Secondary | ICD-10-CM | POA: Insufficient documentation

## 2014-12-16 DIAGNOSIS — W19XXXA Unspecified fall, initial encounter: Secondary | ICD-10-CM

## 2014-12-16 DIAGNOSIS — Z8669 Personal history of other diseases of the nervous system and sense organs: Secondary | ICD-10-CM | POA: Diagnosis not present

## 2014-12-16 DIAGNOSIS — R55 Syncope and collapse: Secondary | ICD-10-CM | POA: Diagnosis present

## 2014-12-16 DIAGNOSIS — M25571 Pain in right ankle and joints of right foot: Secondary | ICD-10-CM | POA: Diagnosis not present

## 2014-12-16 DIAGNOSIS — Z794 Long term (current) use of insulin: Secondary | ICD-10-CM | POA: Diagnosis not present

## 2014-12-16 DIAGNOSIS — R200 Anesthesia of skin: Secondary | ICD-10-CM | POA: Insufficient documentation

## 2014-12-16 DIAGNOSIS — E785 Hyperlipidemia, unspecified: Secondary | ICD-10-CM | POA: Insufficient documentation

## 2014-12-16 DIAGNOSIS — I639 Cerebral infarction, unspecified: Secondary | ICD-10-CM | POA: Diagnosis not present

## 2014-12-16 DIAGNOSIS — Z79899 Other long term (current) drug therapy: Secondary | ICD-10-CM

## 2014-12-16 DIAGNOSIS — E119 Type 2 diabetes mellitus without complications: Secondary | ICD-10-CM | POA: Diagnosis not present

## 2014-12-16 HISTORY — DX: Hemiplegia, unspecified affecting right dominant side: G81.91

## 2014-12-16 HISTORY — DX: Type 2 diabetes mellitus without complications: E11.9

## 2014-12-16 HISTORY — DX: Essential (primary) hypertension: I10

## 2014-12-16 HISTORY — DX: Hyperlipidemia, unspecified: E78.5

## 2014-12-16 HISTORY — DX: Cerebral infarction, unspecified: I63.9

## 2014-12-16 HISTORY — DX: Unspecified intestinal obstruction, unspecified as to partial versus complete obstruction: K56.609

## 2014-12-16 LAB — COMPREHENSIVE METABOLIC PANEL
ALT: 27 U/L (ref 14–54)
ANION GAP: 6 (ref 5–15)
AST: 38 U/L (ref 15–41)
Albumin: 3.7 g/dL (ref 3.5–5.0)
Alkaline Phosphatase: 47 U/L (ref 38–126)
BILIRUBIN TOTAL: 0.3 mg/dL (ref 0.3–1.2)
BUN: 32 mg/dL — ABNORMAL HIGH (ref 6–20)
CALCIUM: 9 mg/dL (ref 8.9–10.3)
CO2: 25 mmol/L (ref 22–32)
CREATININE: 1.95 mg/dL — AB (ref 0.44–1.00)
Chloride: 111 mmol/L (ref 101–111)
GFR, EST AFRICAN AMERICAN: 28 mL/min — AB (ref >60)
GFR, EST NON AFRICAN AMERICAN: 24 mL/min — AB (ref >60)
GLUCOSE: 119 mg/dL — AB (ref 70–99)
Potassium: 4.4 mmol/L (ref 3.5–5.1)
Sodium: 142 mmol/L (ref 135–145)
Total Protein: 6.9 g/dL (ref 6.5–8.1)

## 2014-12-16 LAB — I-STAT CHEM 8, ED
BUN: 36 mg/dL — ABNORMAL HIGH (ref 6–20)
CHLORIDE: 107 mmol/L (ref 101–111)
Calcium, Ion: 1.23 mmol/L (ref 1.13–1.30)
Creatinine, Ser: 1.8 mg/dL — ABNORMAL HIGH (ref 0.44–1.00)
Glucose, Bld: 115 mg/dL — ABNORMAL HIGH (ref 70–99)
HEMATOCRIT: 36 % (ref 36.0–46.0)
HEMOGLOBIN: 12.2 g/dL (ref 12.0–15.0)
POTASSIUM: 4.4 mmol/L (ref 3.5–5.1)
Sodium: 143 mmol/L (ref 135–145)
TCO2: 22 mmol/L (ref 0–100)

## 2014-12-16 LAB — CBC
HEMATOCRIT: 33 % — AB (ref 36.0–46.0)
Hemoglobin: 10.4 g/dL — ABNORMAL LOW (ref 12.0–15.0)
MCH: 28.3 pg (ref 26.0–34.0)
MCHC: 31.5 g/dL (ref 30.0–36.0)
MCV: 89.7 fL (ref 78.0–100.0)
PLATELETS: 286 10*3/uL (ref 150–400)
RBC: 3.68 MIL/uL — AB (ref 3.87–5.11)
RDW: 16.3 % — ABNORMAL HIGH (ref 11.5–15.5)
WBC: 4.9 10*3/uL (ref 4.0–10.5)

## 2014-12-16 LAB — I-STAT TROPONIN, ED: Troponin i, poc: 0 ng/mL (ref 0.00–0.08)

## 2014-12-16 LAB — DIFFERENTIAL
BASOS ABS: 0 10*3/uL (ref 0.0–0.1)
Basophils Relative: 0 % (ref 0–1)
EOS ABS: 0.2 10*3/uL (ref 0.0–0.7)
Eosinophils Relative: 5 % (ref 0–5)
LYMPHS PCT: 33 % (ref 12–46)
Lymphs Abs: 1.6 10*3/uL (ref 0.7–4.0)
Monocytes Absolute: 0.4 10*3/uL (ref 0.1–1.0)
Monocytes Relative: 9 % (ref 3–12)
Neutro Abs: 2.6 10*3/uL (ref 1.7–7.7)
Neutrophils Relative %: 53 % (ref 43–77)

## 2014-12-16 LAB — APTT: aPTT: 32 seconds (ref 24–37)

## 2014-12-16 LAB — ETHANOL

## 2014-12-16 LAB — GLUCOSE, CAPILLARY: GLUCOSE-CAPILLARY: 100 mg/dL — AB (ref 70–99)

## 2014-12-16 LAB — PROTIME-INR
INR: 0.98 (ref 0.00–1.49)
Prothrombin Time: 13.1 seconds (ref 11.6–15.2)

## 2014-12-16 MED ORDER — SODIUM CHLORIDE 0.9 % IV SOLN
Freq: Once | INTRAVENOUS | Status: AC
  Administered 2014-12-16: 19:00:00 via INTRAVENOUS

## 2014-12-16 NOTE — ED Notes (Signed)
Notified [facility] 

## 2014-12-16 NOTE — ED Provider Notes (Signed)
CSN: [id number]     Arrival date & time 12/16/14  1910 History   First MD Initiated Contact with Patient 12/16/14 1920     Chief Complaint  Patient presents with  . Loss of Consciousness     (Consider location/radiation/quality/duration/timing/severity/associated sxs/prior Treatment) HPI Comments: Donna Green is a 74 y.o. Female with a PMH of stroke with residual R side weakness leading to recurrent falls who presents with a fall down stairs. The pt reports R ankle pain, headache, and L sided numbness which is new. The pt was last seen normal by her daughter at 3PM, approximately 4 hours and 45 min prior to initial evaluation. The pt denies neck pain, back pain, nausea, vision changes, abdominal pain, or other symptoms. The pt is on aggrenox for her prior strokes. The pt and her daughter report that the patient had a fall down several stairs but did not have LOC.    Patient is a 74 y.o. female presenting with neurologic complaint. The history is provided by the patient, a relative and medical records (daughter). No language interpreter was used.  Neurologic Problem This is a new problem. The current episode started today. The problem occurs constantly. The problem has been unchanged. Associated symptoms include headaches and numbness. Pertinent negatives include no abdominal pain, change in bowel habit, chest pain, chills, congestion, coughing, diaphoresis, fever, nausea, neck pain, rash, vertigo, visual change, vomiting or weakness. Nothing aggravates the symptoms. She has tried nothing for the symptoms. The treatment provided no relief.    Past Medical History  Diagnosis Date  . Diabetes mellitus without complication   . Hypertension   . Hyperlipidemia   . Small bowel obstruction   . Right hemiparesis   . Stroke     x2   Past Surgical History  Procedure Laterality Date  . Eye surgery    . Tubal ligation    . Bladder surgery    . Bowel resection     No family history on  file. History  Substance Use Topics  . Smoking status: Never Smoker   . Smokeless tobacco: Not on file  . Alcohol Use: No   OB History    No data available     Review of Systems  Constitutional: Negative for fever, chills, diaphoresis and appetite change.  HENT: Negative for congestion and rhinorrhea.   Eyes: Negative for pain and visual disturbance.  Respiratory: Negative for cough, choking, chest tightness, shortness of breath, wheezing and stridor.   Cardiovascular: Negative for chest pain, palpitations and leg swelling.  Gastrointestinal: Negative for nausea, vomiting, abdominal pain, diarrhea, abdominal distention and change in bowel habit.  Genitourinary: Negative for dysuria.  Musculoskeletal: Negative for back pain, neck pain and neck stiffness.  Skin: Negative for rash.  Neurological: Positive for numbness and headaches. Negative for dizziness, vertigo, tremors, seizures and weakness.  Psychiatric/Behavioral: Negative for agitation.  All other systems reviewed and are negative.     Allergies  Allopurinol; Codeine; and Colcrys  Home Medications   Prior to Admission medications   Medication Sig Start Date End Date Taking? Authorizing Provider  amLODipine (NORVASC) 10 MG tablet Take 10 mg by mouth daily.    Historical Provider, MD  atorvastatin (LIPITOR) 40 MG tablet Take 40 mg by mouth daily.    Historical Provider, MD  dipyridamole-aspirin (AGGRENOX) 200-25 MG per 12 hr capsule Take 1 capsule by mouth 2 (two) times daily.    Historical Provider, MD  ibandronate (BONIVA) 150 MG tablet Take 150 mg by  mouth once a week. Take in the morning with a full glass of water, on an empty stomach, and do not take anything else by mouth or lie down for the next 30 min.    Historical Provider, MD  insulin aspart (NOVOLOG FLEXPEN) 100 UNIT/ML FlexPen Inject into the skin 3 (three) times daily with meals. Sliding scale    Historical Provider, MD  insulin glargine (LANTUS) 100 UNIT/ML  injection Inject 34 Units into the skin daily.    Historical Provider, MD  Insulin Pen Needle 32G X 5 MM MISC by Does not apply route.    Historical Provider, MD  LORazepam (ATIVAN) 1 MG tablet Take 1 mg by mouth 2 (two) times daily.    Historical Provider, MD  metoprolol (LOPRESSOR) 100 MG tablet Take 100 mg by mouth 2 (two) times daily.    Historical Provider, MD  pregabalin (LYRICA) 50 MG capsule Take 50 mg by mouth 2 (two) times daily.    Historical Provider, MD  Rosuvastatin Calcium (CRESTOR PO) Take by mouth.    Historical Provider, MD  valsartan-hydrochlorothiazide (DIOVAN-HCT) 320-25 MG per tablet Take 1 tablet by mouth daily.    Historical Provider, MD   There were no vitals taken for this visit. Physical Exam  Constitutional: She is oriented to person, place, and time. She appears well-developed and well-nourished. No distress.  HENT:  Head: Normocephalic.  Mouth/Throat: No oropharyngeal exudate.  Eyes: Conjunctivae and EOM are normal. Pupils are equal, round, and reactive to light.  Neck: Normal range of motion. Neck supple.  Cardiovascular: Normal rate, regular rhythm, normal heart sounds and intact distal pulses.   No murmur heard. Pulmonary/Chest: Effort normal. No stridor. No respiratory distress. She has no wheezes. She has no rales. She exhibits no tenderness.  Abdominal: Soft. Bowel sounds are normal. There is no tenderness. There is no rebound and no guarding.  Musculoskeletal: Normal range of motion. She exhibits tenderness.       Right ankle: She exhibits no swelling, no ecchymosis and no deformity. Tenderness.       Feet:  Neurological: She is alert and oriented to person, place, and time. She has normal reflexes. She displays no tremor and normal reflexes. A sensory deficit is present. No cranial nerve deficit. She exhibits normal muscle tone. Coordination normal. GCS eye subscore is 4. GCS verbal subscore is 5. GCS motor subscore is 6.  Pt reports numbness in entire L  side of body including face, arm, and leg.   Skin: Skin is warm. No rash noted. She is not diaphoretic. No erythema. No pallor.  Vitals reviewed.   ED Course  Procedures (including critical care time) Labs Review Labs Reviewed  CBC - Abnormal; Notable for the following:    RBC 3.68 (*)    Hemoglobin 10.4 (*)    HCT 33.0 (*)    RDW 16.3 (*)    All other components within normal limits  COMPREHENSIVE METABOLIC PANEL - Abnormal; Notable for the following:    Glucose, Bld 119 (*)    BUN 32 (*)    Creatinine, Ser 1.95 (*)    GFR calc non Af Amer 24 (*)    GFR calc Af Amer 28 (*)    All other components within normal limits  I-STAT CHEM 8, ED - Abnormal; Notable for the following:    BUN 36 (*)    Creatinine, Ser 1.80 (*)    Glucose, Bld 115 (*)    All other components within normal limits  ETHANOL  PROTIME-INR  APTT  DIFFERENTIAL  I-STAT TROPOININ, ED    Imaging Review Dg Ankle Complete Right  12/16/2014   CLINICAL DATA:  [place] down the stairs around 16:30  EXAM: RIGHT ANKLE - COMPLETE 3+ VIEW  COMPARISON:  04/06/2013  FINDINGS: There is no evidence of fracture, dislocation, or joint effusion. There is no evidence of arthropathy or other focal bone abnormality. Soft tissues are unremarkable.  IMPRESSION: Negative.   Electronically Signed   By: Andreas Newport M.D.   On: 12/16/2014 21:16   Ct Head Wo Contrast  12/16/2014   CLINICAL DATA:  Left-sided sensory deficit after fall. History of strokes.  EXAM: CT HEAD WITHOUT CONTRAST  TECHNIQUE: Contiguous axial images were obtained from the base of the skull through the vertex without intravenous contrast.  COMPARISON:  04/17/2010  FINDINGS: Skull and Sinuses:No evidence of fracture.  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 Minimal layering fluid within the left sphenoid sinus, considered incidental.  Orbits: No acute abnormality.  Brain: No evidence of acute infarction, hemorrhage, hydrocephalus, or mass  lesion/mass effect. There is generalized cerebral volume loss and white matter disease (underestimated by CT compared to 2007 brain MRI) which is stable from 2011.  Dr. Armida Sans page d 12/16/2014 at 8:21 p.m.  These results were called by telephone at the time of interpretation on 12/16/2014 at [id number] pm to Dr. Roxanne Mins, who verbally acknowledged these results.  IMPRESSION: 1. Negative.  No intracranial hemorrhage or visible infarct. 2. Stable white matter disease and volume loss compared to 2011.   Electronically Signed   By: Monte Fantasia M.D.   On: 12/16/2014 20:29   Mr Brain Wo Contrast  12/17/2014   CLINICAL DATA:  [place] down flight of steps at home, sensory impairment LEFT extremities. History of hypertension, diabetes, hyperlipidemia, strokes with residual RIGHT hemi paresis.  EXAM: MRI HEAD WITHOUT CONTRAST  TECHNIQUE: Multiplanar, multiecho pulse sequences of the brain and surrounding structures were obtained without intravenous contrast.  COMPARISON:  CT of the head September 17, 2014 at 2009 hours and MRI of the brain April 03, 2006  FINDINGS: The ventricles and sulci are normal for patient's age. No abnormal parenchymal signal, mass lesions, mass effect. No reduced diffusion to suggest acute ischemia. No susceptibility artifact to suggest hemorrhage. Remote inferior pontine lacunar infarct. Linear T2 hyperintensity in the RIGHT cerebellum was present previously favoring developmental venous anomaly. Patchy supratentorial white matter FLAIR T2 hyperintensities suggest chronic small vessel ischemic disease.  No abnormal extra-axial fluid collections. No extra-axial masses though, contrast enhanced sequences would be more sensitive. Normal major intracranial vascular flow voids seen at the skull base.  Ocular globes and orbital contents are unremarkable though not tailored for evaluation. No abnormal sellar expansion. Trace ethmoid mucosal thickening without paranasal sinus air-fluid levels. The mastoid air cells  are well aerated. No suspicious calvarial bone marrow signal. No abnormal sellar expansion. Craniocervical junction maintained.  IMPRESSION: No acute ischemia nor acute intracranial process.  Involutional changes. Mild to moderate white matter changes suggest chronic small vessel ischemic disease, within normal range for patient's age.  Remote inferior pontine lacunar infarct.   Electronically Signed   By: Elon Alas   On: 12/17/2014 00:12   Dg Foot Complete Right  12/16/2014   CLINICAL DATA:  Fall.  Initial encounter.  EXAM: RIGHT FOOT COMPLETE - 3+ VIEW  COMPARISON:  None.  FINDINGS: A lucency through the dorsal talar neck appears corticated and chronic. No evidence of acute fracture or dislocation  involving the foot. No acute soft tissue findings.  IMPRESSION: No acute osseous findings.   Electronically Signed   By: Monte Fantasia M.D.   On: 12/16/2014 21:13     EKG Interpretation   Date/Time:  Thursday Dec 16 2014 19:24:00 EDT Ventricular Rate:  79 PR Interval:  165 QRS Duration: 82 QT Interval:  417 QTC Calculation: 478 R Axis:   19 Text Interpretation:  Sinus rhythm Within normal limits When compared with  ECG of 04/23/2010, Nonspecific T wave abnormality are no longer Present  Confirmed by [facility]  MD, DAVID ([id number]) on 12/16/2014 7:30:53 PM      MDM   Final diagnoses:  Numbness    Donna Green is a 74 y.o. female with a PMH of stroke with residual R side weakness leading to recurrent falls who presents with a fall down stairs. The pt had L side numbness on exam and as the patient was last normal just under 5 hours PTA, a code stroke was called. The neuro team came to the bedside and the patient had a stroke workup. The rest of the exam was significant for R ankle pain but no swelling or ROM change. Pt has symmetrical strength in her ankles. The pt had no neck or back tenderness on exam.   The results are seen above from the lab and imaging testing. The CT scan showed no acute  intracranial hemorrhage or infarct. The XR of the ankle were negative for acute fracture. The labs showed results similar to prior.   The pt was given tylenol for ankle pain improving her symptoms.   The neuro team felt the patient was appropriate for discharge if her MRI was negative. The MRI did not show acute stroke and the pt will be discharged with plans to follow up with outpatient neurology. The patient and her daughter voiced understanding of the plan of care with outpatient follow up and return precautions. The pt was discharged in good condition.    This patient was seen with Dr. Roxanne Mins, Emergency Medicine Attending.       Antony Blackbird, MD [id number]  Delora Fuel, MD [id number]

## 2014-12-16 NOTE — ED Provider Notes (Signed)
CSN: [id number]     Arrival date & time 12/16/14  1729 History   First MD Initiated Contact with Patient 12/16/14 [place]     Chief Complaint  Patient presents with  . Fall   (Consider location/radiation/quality/duration/timing/severity/associated sxs/prior Treatment) Patient is a 74 y.o. female presenting with fall. The history is provided by the patient.  Fall This is a new problem. The current episode started 1 to 2 hours ago. The problem has not changed since onset.Associated symptoms include headaches. Pertinent negatives include no chest pain and no abdominal pain.    Past Medical History  Diagnosis Date  . Diabetes mellitus without complication   . Hypertension   . Hyperlipidemia   . Small bowel obstruction   . Right hemiparesis   . Stroke     x2   Past Surgical History  Procedure Laterality Date  . Eye surgery    . Tubal ligation    . Bladder surgery    . Bowel resection     No family history on file. History  Substance Use Topics  . Smoking status: Never Smoker   . Smokeless tobacco: Not on file  . Alcohol Use: No   OB History    No data available     Review of Systems  Constitutional: Negative.   Respiratory: Negative.   Cardiovascular: Negative.  Negative for chest pain and palpitations.  Gastrointestinal: Negative.  Negative for abdominal pain.  Musculoskeletal: Positive for joint swelling and gait problem.  Skin: Negative.   Neurological: Positive for dizziness and headaches.    Allergies  Allopurinol; Codeine; and Colcrys  Home Medications   Prior to Admission medications   Medication Sig Start Date End Date Taking? Authorizing Provider  amLODipine (NORVASC) 10 MG tablet Take 10 mg by mouth daily.   Yes Historical Provider, MD  atorvastatin (LIPITOR) 40 MG tablet Take 40 mg by mouth daily.   Yes Historical Provider, MD  dipyridamole-aspirin (AGGRENOX) 200-25 MG per 12 hr capsule Take 1 capsule by mouth 2 (two) times daily.   Yes Historical  Provider, MD  ibandronate (BONIVA) 150 MG tablet Take 150 mg by mouth once a week. Take in the morning with a full glass of water, on an empty stomach, and do not take anything else by mouth or lie down for the next 30 min.   Yes Historical Provider, MD  insulin aspart (NOVOLOG FLEXPEN) 100 UNIT/ML FlexPen Inject into the skin 3 (three) times daily with meals. Sliding scale   Yes Historical Provider, MD  insulin glargine (LANTUS) 100 UNIT/ML injection Inject 34 Units into the skin daily.   Yes Historical Provider, MD  Insulin Pen Needle 32G X 5 MM MISC by Does not apply route.   Yes Historical Provider, MD  LORazepam (ATIVAN) 1 MG tablet Take 1 mg by mouth 2 (two) times daily.   Yes Historical Provider, MD  metoprolol (LOPRESSOR) 100 MG tablet Take 100 mg by mouth 2 (two) times daily.   Yes Historical Provider, MD  pregabalin (LYRICA) 50 MG capsule Take 50 mg by mouth 2 (two) times daily.   Yes Historical Provider, MD  Rosuvastatin Calcium (CRESTOR PO) Take by mouth.   Yes Historical Provider, MD  valsartan-hydrochlorothiazide (DIOVAN-HCT) 320-25 MG per tablet Take 1 tablet by mouth daily.   Yes Historical Provider, MD   BP 132/78 mmHg  Pulse 67  Temp(Src) 98 F (36.7 C) (Oral)  Resp 18  SpO2 97% Physical Exam  Constitutional: She appears well-developed and well-nourished.  HENT:  Head: Normocephalic.  Right Ear: External ear normal.  Left Ear: External ear normal.  Eyes: Pupils are equal, round, and reactive to light.  Neck: Normal range of motion. Neck supple.  Cardiovascular: Normal rate, regular rhythm, normal heart sounds and intact distal pulses.   Pulmonary/Chest: Effort normal and breath sounds normal. She exhibits no tenderness.  Abdominal: Soft. Bowel sounds are normal. There is no tenderness.  Musculoskeletal: She exhibits tenderness.       Right ankle: She exhibits decreased range of motion and swelling.  Neurological: She is alert.  Skin: Skin is warm and dry.  Nursing  note and vitals reviewed.   ED Course  Procedures (including critical care time) Labs Review Labs Reviewed - No data to display  Imaging Review No results found.   MDM   1. Head injury, acute, initial encounter    Sent for eval of unexplained cause of fall down stairs , right leg injury, sl nausea, loc preceded fall.    Billy Fischer, MD 12/16/14 [phone]

## 2014-12-16 NOTE — Consult Note (Addendum)
Referring Physician: Dr. Roxanne Mins    Chief Complaint: code stroke, left side sensory loss s/p fall  HPI:                                                                                                                                         Donna Green is an 74 y.o. 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 Patient was at home and fell down a flight of steps. She does remember hitting her head on the steps that she was going down. Her daughter found her at the bottom of the steps and said that her mother was alert and awake, not confused, and she did not notice slurred speech, face-arms-legs weakness. EMS was summoned and upon initial ED evaluation was noted to have sensory impairment left arm-leg. NIHSS 1. CT brain was personally reviewed and showed no acute abnormality.   Date last known well: 12/16/14 Time last known well: 1630 tPA Given: no, mild deficits. NIHSS: 1   Past Medical History  Diagnosis Date  . Diabetes mellitus without complication   . Hypertension   . Hyperlipidemia   . Small bowel obstruction   . Right hemiparesis   . Stroke     x2    Past Surgical History  Procedure Laterality Date  . Eye surgery    . Tubal ligation    . Bladder surgery    . Bowel resection      No family history on file. Social History:  reports that she has never smoked. She does not have any smokeless tobacco history on file. She reports that she does not drink alcohol or use illicit drugs.  Allergies:  Allergies  Allergen Reactions  . Allopurinol   . Codeine   . Colcrys [Colchicine]     Medications:                                                                                                                           I have reviewed the patient's current medications.  ROS:  History obtained from the patient, daughter, and chart review  General ROS: negative for - chills, fatigue, fever, night sweats, weight gain or weight loss Psychological ROS: negative for - behavioral disorder, hallucinations, memory difficulties, mood swings or suicidal ideation Ophthalmic ROS: negative for - blurry vision, double vision, eye pain or loss of vision ENT ROS: negative for - epistaxis, nasal discharge, oral lesions, sore throat, tinnitus or vertigo Allergy and Immunology ROS: negative for - hives or itchy/watery eyes Hematological and Lymphatic ROS: negative for - bleeding problems, bruising or swollen lymph nodes Endocrine ROS: negative for - galactorrhea, hair pattern changes, polydipsia/polyuria or temperature intolerance Respiratory ROS: negative for - cough, hemoptysis, shortness of breath or wheezing Cardiovascular ROS: negative for - chest pain, dyspnea on exertion, edema or irregular heartbeat Gastrointestinal ROS: negative for - abdominal pain, diarrhea, hematemesis, nausea/vomiting or stool incontinence Genito-Urinary ROS: negative for - dysuria, hematuria, incontinence or urinary frequency/urgency Musculoskeletal ROS: negative for - joint swelling or muscular weakness right side Neurological ROS: as noted in HPI Dermatological ROS: negative for rash and skin lesion changes   Physical exam: pleasant female in no apparent distress. Blood pressure 139/71, pulse 71, resp. rate 12, SpO2 99 %. Head: normocephalic. Neck: supple, no bruits, no JVD. Cardiac: no murmurs. Lungs: clear. Abdomen: soft, no tender, no mass. Extremities: no edema. CV: pulses palpable throughout  Skin: no rash Neurologic Examination:                                                                                                      General: Mental Status: Alert, oriented, thought content appropriate.  Speech fluent without  evidence of aphasia.  Able to follow 3 step commands without difficulty. Cranial Nerves: II: Discs flat bilaterally; Visual fields grossly normal, pupils equal, round, reactive to light and accommodation III,IV, VI: ptosis not present, extra-ocular motions intact bilaterally V,VII: smile symmetric, facial light touch sensation normal bilaterally VIII: hearing normal bilaterally IX,X: uvula rises symmetrically XI: bilateral shoulder shrug XII: midline tongue extension without atrophy or fasciculations  Motor: Right : Upper extremity   5/5    Left:     Upper extremity   5/5  Lower extremity   5/5     Lower extremity   5/5 Tone and bulk:normal tone throughout; no atrophy noted Sensory: Pinprick and light touch impaired in the left side Deep Tendon Reflexes:  1+ all over Plantars: Right: downgoing   Left: downgoing Cerebellar: normal finger-to-nose,  normal heel-to-shin test Gait:  No tested due to multiple leads.    Results for orders placed or performed during the hospital encounter of 12/16/14 (from the past 48 hour(s))  Ethanol     Status: None   Collection Time: 12/16/14  7:51 PM  Result Value Ref Range   Alcohol, Ethyl (B) <5 <5 mg/dL    Comment:        LOWEST DETECTABLE LIMIT FOR SERUM ALCOHOL IS 11 mg/dL FOR MEDICAL PURPOSES ONLY   Protime-INR     Status: None   Collection Time: 12/16/14  7:51 PM  Result Value Ref Range  Prothrombin Time 13.1 11.6 - 15.2 seconds   INR 0.98 0.00 - 1.49  APTT     Status: None   Collection Time: 12/16/14  7:51 PM  Result Value Ref Range   aPTT 32 24 - 37 seconds  CBC     Status: Abnormal   Collection Time: 12/16/14  7:51 PM  Result Value Ref Range   WBC 4.9 4.0 - 10.5 K/uL   RBC 3.68 (L) 3.87 - 5.11 MIL/uL   Hemoglobin 10.4 (L) 12.0 - 15.0 g/dL   HCT 33.0 (L) 36.0 - 46.0 %   MCV 89.7 78.0 - 100.0 fL   MCH 28.3 26.0 - 34.0 pg   MCHC 31.5 30.0 - 36.0 g/dL   RDW 16.3 (H) 11.5 - 15.5 %   Platelets 286 150 - 400 K/uL   Differential     Status: None   Collection Time: 12/16/14  7:51 PM  Result Value Ref Range   Neutrophils Relative % 53 43 - 77 %   Neutro Abs 2.6 1.7 - 7.7 K/uL   Lymphocytes Relative 33 12 - 46 %   Lymphs Abs 1.6 0.7 - 4.0 K/uL   Monocytes Relative 9 3 - 12 %   Monocytes Absolute 0.4 0.1 - 1.0 K/uL   Eosinophils Relative 5 0 - 5 %   Eosinophils Absolute 0.2 0.0 - 0.7 K/uL   Basophils Relative 0 0 - 1 %   Basophils Absolute 0.0 0.0 - 0.1 K/uL  Comprehensive metabolic panel     Status: Abnormal   Collection Time: 12/16/14  7:51 PM  Result Value Ref Range   Sodium 142 135 - 145 mmol/L   Potassium 4.4 3.5 - 5.1 mmol/L   Chloride 111 101 - 111 mmol/L   CO2 25 22 - 32 mmol/L   Glucose, Bld 119 (H) 70 - 99 mg/dL   BUN 32 (H) 6 - 20 mg/dL   Creatinine, Ser 1.95 (H) 0.44 - 1.00 mg/dL   Calcium 9.0 8.9 - 10.3 mg/dL   Total Protein 6.9 6.5 - 8.1 g/dL   Albumin 3.7 3.5 - 5.0 g/dL   AST 38 15 - 41 U/L   ALT 27 14 - 54 U/L   Alkaline Phosphatase 47 38 - 126 U/L   Total Bilirubin 0.3 0.3 - 1.2 mg/dL   GFR calc non Af Amer 24 (L) >60 mL/min   GFR calc Af Amer 28 (L) >60 mL/min    Comment: (NOTE) The eGFR has been calculated using the CKD EPI equation. This calculation has not been validated in all clinical situations. eGFR's persistently <60 mL/min signify possible Chronic Kidney Disease.    Anion gap 6 5 - 15  I-stat troponin, ED (not at [facility], [facility])     Status: None   Collection Time: 12/16/14  7:59 PM  Result Value Ref Range   Troponin i, poc 0.00 0.00 - 0.08 ng/mL   Comment 3            Comment: Due to the release kinetics of cTnI, a negative result within the first hours of the onset of symptoms does not rule out myocardial infarction with certainty. If myocardial infarction is still suspected, repeat the test at appropriate intervals.   I-Stat Chem 8, ED  (not at [facility], [facility])     Status: Abnormal   Collection Time: 12/16/14  8:01 PM  Result Value Ref Range   Sodium 143 135  - 145 mmol/L   Potassium 4.4 3.5 - 5.1 mmol/L  Chloride 107 101 - 111 mmol/L   BUN 36 (H) 6 - 20 mg/dL   Creatinine, Ser 1.80 (H) 0.44 - 1.00 mg/dL   Glucose, Bld 115 (H) 70 - 99 mg/dL   Calcium, Ion 1.23 1.13 - 1.30 mmol/L   TCO2 22 0 - 100 mmol/L   Hemoglobin 12.2 12.0 - 15.0 g/dL   HCT 36.0 36.0 - 46.0 %   Dg Ankle Complete Right  12/16/2014   CLINICAL DATA:  [place] down the stairs around 16:30  EXAM: RIGHT ANKLE - COMPLETE 3+ VIEW  COMPARISON:  04/06/2013  FINDINGS: There is no evidence of fracture, dislocation, or joint effusion. There is no evidence of arthropathy or other focal bone abnormality. Soft tissues are unremarkable.  IMPRESSION: Negative.   Electronically Signed   By: Andreas Newport M.D.   On: 12/16/2014 21:16   Ct Head Wo Contrast  12/16/2014   CLINICAL DATA:  Left-sided sensory deficit after fall. History of strokes.  EXAM: CT HEAD WITHOUT CONTRAST  TECHNIQUE: Contiguous axial images were obtained from the base of the skull through the vertex without intravenous contrast.  COMPARISON:  04/17/2010  FINDINGS: Skull and Sinuses:No evidence of fracture.  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 Minimal layering fluid within the left sphenoid sinus, considered incidental.  Orbits: No acute abnormality.  Brain: No evidence of acute infarction, hemorrhage, hydrocephalus, or mass lesion/mass effect. There is generalized cerebral volume loss and white matter disease (underestimated by CT compared to 2007 brain MRI) which is stable from 2011.  Dr. Armida Sans page d 12/16/2014 at 8:21 p.m.  These results were called by telephone at the time of interpretation on 12/16/2014 at 1:43 pm to Dr. Roxanne Mins, who verbally acknowledged these results.  IMPRESSION: 1. Negative.  No intracranial hemorrhage or visible infarct. 2. Stable white matter disease and volume loss compared to 2011.   Electronically Signed   By: Monte Fantasia M.D.   On: 12/16/2014 20:29   Dg Foot Complete  Right  12/16/2014   CLINICAL DATA:  Fall.  Initial encounter.  EXAM: RIGHT FOOT COMPLETE - 3+ VIEW  COMPARISON:  None.  FINDINGS: A lucency through the dorsal talar neck appears corticated and chronic. No evidence of acute fracture or dislocation involving the foot. No acute soft tissue findings.  IMPRESSION: No acute osseous findings.   Electronically Signed   By: Monte Fantasia M.D.   On: 12/16/2014 21:13    Assessment: 74 y.o. female brought in after sustaining a fall at home and noted to have left sided sensory impairment. NIHSS 1. CT brain negative for acute abnormality. ? Small right thalamic or parietal infarct. Admit to medicine overnight, get MRI brain and pursue further stroke work up if MRI positive for stroke. Will follow up  Stroke Risk Factors - HTN, DM, hyperlipidemia, stroke   Dorian Pod, MD [facility] [phone]  12/16/2014, 9:22 PM

## 2014-12-16 NOTE — ED Notes (Signed)
Patient comes from Urgent Care. Patient states she walking down the stairs. Patient is on Blood thinners and states does not know if hit head but has headache. Patient has Hx of stroke right sided weakness. Today patient has decrease sensation on Left side. No drift noted.

## 2014-12-16 NOTE — ED Notes (Signed)
MD Tegeler at bedside, advised of pts sensory changes on left side

## 2014-12-16 NOTE — ED Notes (Signed)
Report called to [facility], ED Charge RN.

## 2014-12-16 NOTE — ED Notes (Addendum)
Pt states she was coming down the steps when "next thing I knew I was at the bottom".  Family member states as soon as she heard the fall, she came to pt, and pt was talking "just fine".  Pt c/o right ankle pain radiating up lateral RLE.  C/O dizziness & blurred vision.  A&Ox3.  Has hx right hemiparesis.

## 2014-12-16 NOTE — ED Notes (Signed)
Report given to [facility]. 

## 2014-12-16 NOTE — ED Notes (Signed)
CT called for Code stroke, advised they will call as soon as they have a bed available

## 2014-12-16 NOTE — ED Notes (Signed)
Pt reports she got up and fell down the stairs around 1630, pts daughter did not see the fall but states pt was sitting on her bottom and alert when she got to her. Pt alert, oriented, no other neuro deficits besides sensory changes on left side compared to right side. Dr. Sherry Ruffing in room to assess these findings. Respirations e/u, pt alert, oriented x 4, nad.

## 2014-12-17 MED ORDER — ACETAMINOPHEN 325 MG PO TABS
650.0000 mg | ORAL_TABLET | Freq: Once | ORAL | Status: AC
  Administered 2014-12-17: 650 mg via ORAL
  Filled 2014-12-17: qty 2

## 2014-12-17 NOTE — ED Notes (Signed)
MD at bedside. 

## 2014-12-17 NOTE — Discharge Instructions (Signed)
Stroke Prevention Some medical conditions and behaviors are associated with an increased chance of having a stroke. You may prevent a stroke by making healthy choices and managing medical conditions. HOW CAN I REDUCE MY RISK OF HAVING A STROKE?   Stay physically active. Get at least 30 minutes of activity on most or all days.  Do not smoke. It may also be helpful to avoid exposure to secondhand smoke.  Limit alcohol use. Moderate alcohol use is considered to be:  No more than 2 drinks per day for men.  No more than 1 drink per day for nonpregnant women.  Eat healthy foods. This involves:  Eating 5 or more servings of fruits and vegetables a day.  Making dietary changes that address high blood pressure (hypertension), high cholesterol, diabetes, or obesity.  Manage your cholesterol levels.  Making food choices that are high in fiber and low in saturated fat, trans fat, and cholesterol may control cholesterol levels.  Take any prescribed medicines to control cholesterol as directed by your health care provider.  Manage your diabetes.  Controlling your carbohydrate and sugar intake is recommended to manage diabetes.  Take any prescribed medicines to control diabetes as directed by your health care provider.  Control your hypertension.  Making food choices that are low in salt (sodium), saturated fat, trans fat, and cholesterol is recommended to manage hypertension.  Take any prescribed medicines to control hypertension as directed by your health care provider.  Maintain a healthy weight.  Reducing calorie intake and making food choices that are low in sodium, saturated fat, trans fat, and cholesterol are recommended to manage weight.  Stop drug abuse.  Avoid taking birth control pills.  Talk to your health care provider about the risks of taking birth control pills if you are over 35 years old, smoke, get migraines, or have ever had a blood clot.  Get evaluated for sleep  disorders (sleep apnea).  Talk to your health care provider about getting a sleep evaluation if you snore a lot or have excessive sleepiness.  Take medicines only as directed by your health care provider.  For some people, aspirin or blood thinners (anticoagulants) are helpful in reducing the risk of forming abnormal blood clots that can lead to stroke. If you have the irregular heart rhythm of atrial fibrillation, you should be on a blood thinner unless there is a good reason you cannot take them.  Understand all your medicine instructions.  Make sure that other conditions (such as anemia or atherosclerosis) are addressed. SEEK IMMEDIATE MEDICAL CARE IF:   You have sudden weakness or numbness of the face, arm, or leg, especially on one side of the body.  Your face or eyelid droops to one side.  You have sudden confusion.  You have trouble speaking (aphasia) or understanding.  You have sudden trouble seeing in one or both eyes.  You have sudden trouble walking.  You have dizziness.  You have a loss of balance or coordination.  You have a sudden, severe headache with no known cause.  You have new chest pain or an irregular heartbeat. Any of these symptoms may represent a serious problem that is an emergency. Do not wait to see if the symptoms will go away. Get medical help at once. Call your local emergency services (911 in [place] Do not drive yourself to the hospital. Document Released: 09/06/2004 Document Revised: 12/14/2013 Document Reviewed: 01/30/2013 [employer] Patient Information 2015 [employer]. This information is not intended to replace advice given   to you by your health care provider. Make sure you discuss any questions you have with your health care provider.  

## 2015-03-07 ENCOUNTER — Encounter: Payer: Self-pay | Admitting: Neurology

## 2015-03-07 ENCOUNTER — Ambulatory Visit (INDEPENDENT_AMBULATORY_CARE_PROVIDER_SITE_OTHER): Payer: Medicare Other | Admitting: Neurology

## 2015-03-07 VITALS — BP 91/56 | HR 67 | Ht 65.5 in | Wt 182.4 lb

## 2015-03-07 DIAGNOSIS — E1159 Type 2 diabetes mellitus with other circulatory complications: Secondary | ICD-10-CM | POA: Diagnosis not present

## 2015-03-07 DIAGNOSIS — E785 Hyperlipidemia, unspecified: Secondary | ICD-10-CM | POA: Diagnosis not present

## 2015-03-07 DIAGNOSIS — Z8673 Personal history of transient ischemic attack (TIA), and cerebral infarction without residual deficits: Secondary | ICD-10-CM | POA: Diagnosis not present

## 2015-03-07 DIAGNOSIS — I10 Essential (primary) hypertension: Secondary | ICD-10-CM | POA: Diagnosis not present

## 2015-03-07 NOTE — Patient Instructions (Signed)
-   continue aggrenox and crestor for stroke prevention - stroke follow up with carotid doppler, TCD, and 2D cardiogram - check stroke labs today - Follow up with your primary care physician for stroke risk factor modification. Recommend maintain blood pressure goal <130/80, diabetes with hemoglobin A1c goal below 6.5% and lipids with LDL cholesterol goal below 70 mg/dL.  - check BP and glucose at home.  - your BP is on the low side. Check BP at home and record and bring over to PCP for BP meds adjustment - follow up in 6 months.

## 2015-03-08 LAB — TSH+FREE T4
Free T4: 0.97 ng/dL (ref 0.82–1.77)
TSH: 2.71 u[IU]/mL (ref 0.450–4.500)

## 2015-03-08 LAB — LIPID PANEL
CHOLESTEROL TOTAL: 87 mg/dL — AB (ref 100–199)
Chol/HDL Ratio: 2 ratio units (ref 0.0–4.4)
HDL: 43 mg/dL (ref >39)
LDL Calculated: 25 mg/dL (ref 0–99)
TRIGLYCERIDES: 97 mg/dL (ref 0–149)
VLDL Cholesterol Cal: 19 mg/dL (ref 5–40)

## 2015-03-08 LAB — HEMOGLOBIN A1C
Est. average glucose Bld gHb Est-mCnc: 151 mg/dL
HEMOGLOBIN A1C: 6.9 % — AB (ref 4.8–5.6)

## 2015-03-09 ENCOUNTER — Telehealth: Payer: Self-pay | Admitting: Neurology

## 2015-03-09 ENCOUNTER — Telehealth: Payer: Self-pay

## 2015-03-09 DIAGNOSIS — E1159 Type 2 diabetes mellitus with other circulatory complications: Secondary | ICD-10-CM | POA: Insufficient documentation

## 2015-03-09 DIAGNOSIS — E785 Hyperlipidemia, unspecified: Secondary | ICD-10-CM | POA: Insufficient documentation

## 2015-03-09 DIAGNOSIS — I10 Essential (primary) hypertension: Secondary | ICD-10-CM | POA: Insufficient documentation

## 2015-03-09 NOTE — Telephone Encounter (Signed)
-----   Message from Hope Pigeon, RN sent at 03/09/2015  8:47 AM EDT -----   ----- Message -----    From: Rosalin Hawking, MD    Sent: 03/08/2015   5:53 PM      To: Edwards, please let the patient note that her blood tests yesterday in our office showing normal site function and lipid profile, and also her A1c is 6.9, much better than before. Continue current treatment plan. Thanks.  Rosalin Hawking, MD PhD Stroke Neurology 03/08/2015 5:53 PM

## 2015-03-09 NOTE — Progress Notes (Signed)
NEUROLOGY CLINIC NEW PATIENT NOTE  NAME: Donna Green DOB: 06/24/1941  I saw Donna Green as a new patient in the neurovascular clinic today regarding  Chief Complaint  Patient presents with  . New Evaluation    Fall, dizzines, headache, Pain=Nueuropathy, numbness  .  HPI: Donna Green is a 74 y.o. female with PMH of HTN, DM, CAD, HLD, stroke in 2001 and 2007 who presents as a new patient for ED follow up concerning for stroke.   On 12/16/2014, patient was getting down stairs, sustained a fall from stairs. She had right ankle sprain due to the fall, and she seems to feel dizzy, drowsy, less responding after the fall, however, daughter denies any loss of consciousness or incoherent. EMS called, patient was sent to ER. X-ray no fracture, however due to hearing loss and anxious, patient did not answer questions quite well. Also pt family mistakenly said her left side weakness was new, actually is old from previous strokes, triggered MRI brain test which showed no acute stroke. Pt was discharged home but requested neurology follow up.   Pt did have stroke in 2001 and MRI showed acute posterior left parietal infarct, MRA unremarkable. She had a stroke again in 2007 with MRI showing multifocal infarcts involving high left parietal cortex, right posterior parietal cortex, and superior cerebellum on the right as well as a second focus adjacent to that. The pattern suggests a cardiac or ascending aortic embolic source. She was followed by Dr. Leonie Man at that time, and was put on aggrenox. Not sure how much workup has been done at the time. The patient did have a cerebral angiogram which did not show large vessel occlusion. A repeat MRI was done one week later showed subacute right cerebellar infarct along with left pontine lacunar infarct.   Pt also has MGUS and followed with oncology but not on any specific treatment.   For the last OT years, patient had no new recurrent strokes, patient still on  Aggrenox until now but lost follow-up. Patient stated that his hypertension is in good control however diabetes was not in good control. Today in clinic blood pressure 91/56.  Past Medical History  Diagnosis Date  . Diabetes mellitus without complication   . Hypertension   . Hyperlipidemia   . Small bowel obstruction   . Right hemiparesis   . Stroke     x2  . Headache   . Chronic kidney disease    Past Surgical History  Procedure Laterality Date  . Eye surgery    . Tubal ligation    . Bladder surgery    . Bowel resection     Family History  Problem Relation Age of Onset  . Diabetes Mother   . Heart disease Mother   . Parkinsonism Father   . Diabetes Sister   . Diabetes Brother   . Cancer Brother   . Diabetes Maternal Grandmother    Current Outpatient Prescriptions  Medication Sig Dispense Refill  . acetaminophen (TYLENOL) 325 MG tablet Take 650 mg by mouth every 6 (six) hours as needed for moderate pain.    Marland Kitchen amLODipine (NORVASC) 10 MG tablet Take 10 mg by mouth daily.    . CRESTOR 20 MG tablet Take 20 mg by mouth every evening.     . dipyridamole-aspirin (AGGRENOX) 200-25 MG per 12 hr capsule Take 1 capsule by mouth 2 (two) times daily.    Marland Kitchen ibandronate (BONIVA) 150 MG tablet Take 150 mg by mouth every  30 (thirty) days. Take in the morning with a full glass of water, on an empty stomach, and do not take anything else by mouth or lie down for the next 30 min.    . insulin aspart (NOVOLOG FLEXPEN) 100 UNIT/ML FlexPen Inject 4-8 Units into the skin 3 (three) times daily with meals. Sliding scale    . insulin glargine (LANTUS) 100 UNIT/ML injection Inject 34 Units into the skin at bedtime.     Marland Kitchen LANTUS SOLOSTAR 100 UNIT/ML Solostar Pen     . LORazepam (ATIVAN) 1 MG tablet Take 1 mg by mouth 2 (two) times daily.    . metoprolol (LOPRESSOR) 100 MG tablet Take 100 mg by mouth 2 (two) times daily.    . pregabalin (LYRICA) 50 MG capsule Take 50 mg by mouth 2 (two) times daily.      . valsartan-hydrochlorothiazide (DIOVAN-HCT) 320-25 MG per tablet Take 1 tablet by mouth daily.    Marland Kitchen ZOSTAVAX 09811 UNT/0.65ML injection Inject 1 each into the muscle once.     No current facility-administered medications for this visit.   Allergies  Allergen Reactions  . Codeine Other (See Comments)    Paranoid    . Other Other (See Comments)    Patient has stage 3 kidney disease-CANNOT TAKES ANY NSAIDS  . Allopurinol Nausea Only and Other (See Comments)    hallucinations  . Colcrys [Colchicine] Other (See Comments)    HALLUCINATIONS   History   Social History  . Marital Status: Widowed    Spouse Name: N/A  . Number of Children: 2  . Years of Education: N/A   Occupational History  . retired     Social History Main Topics  . Smoking status: Never Smoker   . Smokeless tobacco: Never Used  . Alcohol Use: No  . Drug Use: No  . Sexual Activity: Not on file   Other Topics Concern  . Not on file   Social History Narrative   Live with Daughter       1- 2 times a week     Review of Systems Full 14 system review of systems performed and notable only for those listed, all others are neg:  Constitutional:   Cardiovascular:  Ear/Nose/Throat:  Hearing loss, ringing in ears Skin:  Eyes:   Respiratory:   Gastroitestinal:  Diarrhea Genitourinary:  Hematology/Lymphatic:   Endocrine:  Musculoskeletal:   Allergy/Immunology:   Neurological:   Psychiatric:  Sleep:   Physical Exam  Filed Vitals:   03/07/15 1401  BP: 91/56  Pulse: 81    General - Well nourished, well developed, in no apparent distress.  Ophthalmologic - fundi not visualize due to noncooperation.  Cardiovascular - Regular rate and rhythm with no murmur   Neck - supple, no nuchal rigidity .  Mental Status -  Level of arousal and orientation to time, place, and person were intact. Language including expression, naming, repetition, comprehension, reading, and writing was assessed and found  intact. Fund of Knowledge was assessed and was intact.  Cranial Nerves II - XII - II - Visual field intact OU. III, IV, VI - Extraocular movements intact. V - Facial sensation intact bilaterally. VII - Facial movement intact bilaterally. VIII - Hearing & vestibular intact bilaterally. X - Palate elevates symmetrically. XI - Chin turning & shoulder shrug intact bilaterally. XII - Tongue protrusion intact.  Motor Strength - The patient's strength was normal in all extremities and pronator drift was absent.  Bulk was normal and fasciculations were absent.  Motor Tone - Muscle tone was assessed at the neck and appendages and was normal.  Reflexes - The patient's reflexes were normal in all extremities and she had no pathological reflexes.  Sensory - Light touch, temperature/pinprick, vibration and proprioception, and Romberg testing were assessed and were normal.    Coordination - The patient had normal movements in the hands and feet with no ataxia or dysmetria.  Tremor was absent.  Gait and Station - The patient's transfers, posture, gait, station, and turns were observed as normal.   Imaging  I have personally reviewed the radiological images below and agree with the radiology interpretations.  MRI 06/13/2000 SMALL, ACUTE NON-HEMORRHAGIC INFARCT, POSTERIOR LEFT PARIETAL LOBE. MILD TO SLIGHTLY MODERATE NONSPECIFIC WHITE MATTER-TYPE CHANGES PROBABLY REPRESENT SEQUELA OF SMALL VESSEL DISEASE. PARANASAL SINUS DISEASE WITH A SMALL AIR FLUID LEVEL WITHIN THE LEFT MAXILLARY SINUS.  MRA 06/13/2000 VERY MILD INTRACRANIAL ARTERIOSCLEROTIC-TYPE CHANGES AS DISCUSSED ABOVE.  MRI 03/27/06 1. Several scattered punctate acute strokes suggesting a cardiac or ascending aortic embolic source. These punctate acute strokes are seen in the high left parietal cortex, the right parietal cortex, and the right cerebellar hemisphere.  MRI 04/04/06 1. Single focal punctate area of hyperintense signal on  diffusion-weighted axial images suggestive of subacute ischemic change.  2. Stable probable small vessel type disease changes supratentorially.  3. Stable left paramedian pons lacune.  4. Minimal sinusitis changes.  Cerebral angio  1. No focal area of inferior luminal filling defects seen in the left MCA distribution at the time of the angiogram. Moderate-sized focal area of some perfusion in the left parietooccipital region distally and also a small focal area of the left anterior parietal region, as described.  2. 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  3. Hypoplastic right A1 segment.   MRI 12/17/14 No acute ischemia nor acute intracranial process. Involutional changes. Mild to moderate white matter changes suggest chronic small vessel ischemic disease, within normal range for patient's age. Remote inferior pontine lacunar infarct.  Lab Review Component     Latest Ref Rng 03/07/2015  Cholesterol, Total     100 - 199 mg/dL 87 (L)  Triglycerides     0 - 149 mg/dL 97  HDL Cholesterol     >39 mg/dL 43  VLDL Cholesterol Cal     5 - 40 mg/dL 19  LDL (calc)     0 - 99 mg/dL 25  Total CHOL/HDL Ratio     0.0 - 4.4 ratio units 2.0  TSH     0.450 - 4.500 uIU/mL 2.710  Free T4     0.82 - 1.77 ng/dL 0.97  Hemoglobin A1C     4.8 - 5.6 % 6.9 (H)  Est. average glucose Bld gHb Est-mCnc      151    Assessment and Plan:   In summary, Donna Green is a 74 y.o. female with PMH of HTN, DM, HLD, CAD, MGUS presented as new pt as ER follow up of a fall concerning for stroke. MRI negative and history did not support any concern of new stroke this time. However, pt did have ischemic stroke in 2001 and 2007, more consistent with embolic pattern. Patient had been on Aggrenox. For the last 10 years, patient has no recurrent episode. At this time, do not think embolic workup warranted. We will continue Aggrenox and Crestor for stroke prevention, but proceed with  regular stroke workup as CUS, TCD, and TTE as well as stroke labs.   -  continue aggrenox and crestor for stroke prevention - stroke follow up with carotid doppler, TCD, and 2D cardiogram - check stroke labs today - Follow up with your primary care physician for stroke risk factor modification. Recommend maintain blood pressure goal <130/80, diabetes with hemoglobin A1c goal below 6.5% and lipids with LDL cholesterol goal below 70 mg/dL.  - check BP and glucose at home.  - your BP is on the low side. Check BP at home and record and bring over to PCP for BP meds adjustment - RTC in 6 months.  I recommend aggressive blood pressure control with a goal <130/80 mm Hg.  Lipids should be managed intensively, with a goal LDL < 70 mg/dL.  I encouraged the patient to discuss these important issues with her primary care physician.  I counseled the patient on measures to reduce stroke risk, including the importance of medication compliance, risk factor control, exercise, healthy diet, and avoidance of smoking.  I reviewed stroke warning signs and symptoms and appropriate actions to take if such occurs.   Thank you very much for the opportunity to participate in the care of this patient.  Please do not hesitate to call if any questions or concerns arise.  Orders Placed This Encounter  Procedures  . US Carotid Bilateral    Standing Status: Future     Number of Occurrences:      Standing Expiration Date: 05/08/2016    Order Specific Question:  Reason for Exam (SYMPTOM  OR DIAGNOSIS REQUIRED)    Answer:  hx of embolic stroke    Order Specific Question:  Preferred imaging location?    Answer:  Internal  . [place] TCD COMPLETE    Standing Status: Future     Number of Occurrences:      Standing Expiration Date: 05/08/2016    Order Specific Question:  Reason for Exam (SYMPTOM  OR DIAGNOSIS REQUIRED)    Answer:  hx of embolic stroke    Order Specific Question:  Preferred imaging location?    Answer:  Internal  .  [place] TCD WITHMONITORING    Standing Status: Future     Number of Occurrences:      Standing Expiration Date: 09/07/2015    Order Specific Question:  Reason for Exam (SYMPTOM  OR DIAGNOSIS REQUIRED)    Answer:  hx of embolic stroke    Order Specific Question:  Preferred imaging location?    Answer:  Internal  . TSH + free T4  . Hemoglobin A1c  . Lipid panel  . ECHOCARDIOGRAM COMPLETE    Standing Status: Future     Number of Occurrences:      Standing Expiration Date: 06/05/2016    Order Specific Question:  Where should this test be performed    Answer:  [facility])    Order Specific Question:  Complete or Limited study?    Answer:  Complete    Order Specific Question:  With Image Enhancing Agent or without Image Enhancing Agent?    Answer:  With Image Enhancing Agent    Order Specific Question:  Reason for exam-Echo    Answer:  Stroke  434.91 / I163.9    Meds ordered this encounter  Medications  . LANTUS SOLOSTAR 100 UNIT/ML Solostar Pen    Sig:     Patient Instructions  - continue aggrenox and crestor for stroke prevention - stroke follow up with carotid doppler, TCD, and 2D cardiogram - check stroke labs today - Follow up with your  primary care physician for stroke risk factor modification. Recommend maintain blood pressure goal <130/80, diabetes with hemoglobin A1c goal below 6.5% and lipids with LDL cholesterol goal below 70 mg/dL.  - check BP and glucose at home.  - your BP is on the low side. Check BP at home and record and bring over to PCP for BP meds adjustment - follow up in 6 months.   Rosalin Hawking, MD PhD [facility] [address] [place], [place] [address] [phone]

## 2015-03-09 NOTE — Telephone Encounter (Signed)
LEFT MESSAGE WITH CALL CENTER TO REMIND DR XU STAFF THAT PRECERT NEEDS TO BE DONE FOR  UPCOMING ECHO ON  03/10/2015 CPT CODE 60454 . CALL CENTER WILL FORWARD MESSAGE TO DR [facility]

## 2015-03-09 NOTE — Telephone Encounter (Signed)
Donna Green with [facility] called stating need medical pre-cert for Echocardiagram on 7/28. Patient has [name]. She can be reached if necesary at [phone].

## 2015-03-09 NOTE — Telephone Encounter (Signed)
Spoke to patient. Gave lab results. Patient verbalized understanding.  

## 2015-03-09 NOTE — Telephone Encounter (Signed)
RECEIVED  VERY PLEASANT STAFF MESSAGE FROM Milta Deiters McBride, WITH AUTH # [id number] EXPIRES 04/23/2015 .

## 2015-03-11 ENCOUNTER — Ambulatory Visit (HOSPITAL_COMMUNITY): Payer: Medicare Other

## 2015-03-14 ENCOUNTER — Ambulatory Visit (HOSPITAL_COMMUNITY): Payer: Medicare Other | Attending: Cardiovascular Disease

## 2015-03-14 ENCOUNTER — Other Ambulatory Visit: Payer: Self-pay

## 2015-03-14 DIAGNOSIS — Z8673 Personal history of transient ischemic attack (TIA), and cerebral infarction without residual deficits: Secondary | ICD-10-CM | POA: Diagnosis not present

## 2015-03-14 DIAGNOSIS — I10 Essential (primary) hypertension: Secondary | ICD-10-CM | POA: Insufficient documentation

## 2015-03-14 DIAGNOSIS — I639 Cerebral infarction, unspecified: Secondary | ICD-10-CM | POA: Diagnosis present

## 2015-03-14 DIAGNOSIS — E119 Type 2 diabetes mellitus without complications: Secondary | ICD-10-CM | POA: Diagnosis not present

## 2015-03-16 ENCOUNTER — Telehealth: Payer: Self-pay

## 2015-03-16 NOTE — Telephone Encounter (Signed)
Spoke with patient and informed her that her echocardiogram was unremarkable.  She verbalized understanding.

## 2015-03-23 ENCOUNTER — Ambulatory Visit (INDEPENDENT_AMBULATORY_CARE_PROVIDER_SITE_OTHER): Payer: Medicare Other

## 2015-03-23 DIAGNOSIS — Z8673 Personal history of transient ischemic attack (TIA), and cerebral infarction without residual deficits: Secondary | ICD-10-CM | POA: Diagnosis not present

## 2015-04-08 ENCOUNTER — Other Ambulatory Visit: Payer: Self-pay | Admitting: Internal Medicine

## 2015-04-08 DIAGNOSIS — Z1231 Encounter for screening mammogram for malignant neoplasm of breast: Secondary | ICD-10-CM

## 2015-04-13 ENCOUNTER — Ambulatory Visit
Admission: RE | Admit: 2015-04-13 | Discharge: 2015-04-13 | Disposition: A | Discharge status: Home / Self Care | Payer: Medicare Other | Source: Ambulatory Visit | Attending: Internal Medicine | Admitting: Internal Medicine

## 2015-04-13 DIAGNOSIS — Z1231 Encounter for screening mammogram for malignant neoplasm of breast: Secondary | ICD-10-CM

## 2015-05-03 NOTE — Progress Notes (Unsigned)
Patients  Lab codes for her lab work and diagnose were mail with the enclose envelope and fax to [phone]. It was to [facility] in [place].

## 2015-08-19 ENCOUNTER — Encounter: Payer: Self-pay | Admitting: *Deleted

## 2015-08-19 ENCOUNTER — Other Ambulatory Visit: Payer: Self-pay | Admitting: Oncology

## 2015-08-19 DIAGNOSIS — D472 Monoclonal gammopathy: Secondary | ICD-10-CM

## 2015-08-19 NOTE — Progress Notes (Signed)
rec'd call from dr Jori Moll polite's office. They would like for patient to have a f/u with dr Alen Blew. Per dr Alen Blew POF sent for 10/2015. Returned call and left on VM

## 2015-08-21 ENCOUNTER — Telehealth: Payer: Self-pay | Admitting: Oncology

## 2015-08-21 NOTE — Telephone Encounter (Signed)
Called patient and she is aware of her 3/14 follow up visit

## 2015-09-07 ENCOUNTER — Ambulatory Visit: Payer: Medicare Other | Admitting: Neurology

## 2015-10-24 ENCOUNTER — Telehealth: Payer: Self-pay | Admitting: Oncology

## 2015-10-24 NOTE — Telephone Encounter (Signed)
pt cld to cx appt-no r/s at this time

## 2015-10-25 ENCOUNTER — Ambulatory Visit: Payer: Medicare Other | Admitting: Oncology

## 2015-10-25 ENCOUNTER — Other Ambulatory Visit: Payer: Medicare Other

## 2015-11-01 ENCOUNTER — Encounter (INDEPENDENT_AMBULATORY_CARE_PROVIDER_SITE_OTHER): Payer: Medicare Other | Admitting: Ophthalmology

## 2015-11-01 DIAGNOSIS — E113511 Type 2 diabetes mellitus with proliferative diabetic retinopathy with macular edema, right eye: Secondary | ICD-10-CM | POA: Diagnosis not present

## 2015-11-01 DIAGNOSIS — H43813 Vitreous degeneration, bilateral: Secondary | ICD-10-CM | POA: Diagnosis not present

## 2015-11-01 DIAGNOSIS — E11311 Type 2 diabetes mellitus with unspecified diabetic retinopathy with macular edema: Secondary | ICD-10-CM

## 2015-11-01 DIAGNOSIS — I10 Essential (primary) hypertension: Secondary | ICD-10-CM

## 2015-11-01 DIAGNOSIS — E113312 Type 2 diabetes mellitus with moderate nonproliferative diabetic retinopathy with macular edema, left eye: Secondary | ICD-10-CM | POA: Diagnosis not present

## 2015-11-01 DIAGNOSIS — H35033 Hypertensive retinopathy, bilateral: Secondary | ICD-10-CM

## 2015-11-01 DIAGNOSIS — H2513 Age-related nuclear cataract, bilateral: Secondary | ICD-10-CM

## 2015-11-03 ENCOUNTER — Ambulatory Visit: Payer: Medicare Other | Admitting: Neurology

## 2015-12-01 ENCOUNTER — Encounter (INDEPENDENT_AMBULATORY_CARE_PROVIDER_SITE_OTHER): Payer: Medicare Other | Admitting: Ophthalmology

## 2015-12-01 DIAGNOSIS — E113312 Type 2 diabetes mellitus with moderate nonproliferative diabetic retinopathy with macular edema, left eye: Secondary | ICD-10-CM | POA: Diagnosis not present

## 2015-12-01 DIAGNOSIS — E113511 Type 2 diabetes mellitus with proliferative diabetic retinopathy with macular edema, right eye: Secondary | ICD-10-CM

## 2015-12-01 DIAGNOSIS — I10 Essential (primary) hypertension: Secondary | ICD-10-CM

## 2015-12-01 DIAGNOSIS — H35033 Hypertensive retinopathy, bilateral: Secondary | ICD-10-CM | POA: Diagnosis not present

## 2015-12-01 DIAGNOSIS — H43813 Vitreous degeneration, bilateral: Secondary | ICD-10-CM | POA: Diagnosis not present

## 2015-12-01 DIAGNOSIS — H2513 Age-related nuclear cataract, bilateral: Secondary | ICD-10-CM

## 2015-12-01 DIAGNOSIS — E11311 Type 2 diabetes mellitus with unspecified diabetic retinopathy with macular edema: Secondary | ICD-10-CM

## 2015-12-22 ENCOUNTER — Other Ambulatory Visit: Payer: Self-pay | Admitting: Internal Medicine

## 2015-12-22 ENCOUNTER — Ambulatory Visit
Admission: RE | Admit: 2015-12-22 | Discharge: 2015-12-22 | Disposition: A | Discharge status: Home / Self Care | Payer: Medicare Other | Source: Ambulatory Visit | Attending: Internal Medicine | Admitting: Internal Medicine

## 2015-12-22 DIAGNOSIS — M25532 Pain in left wrist: Secondary | ICD-10-CM

## 2015-12-27 ENCOUNTER — Encounter (INDEPENDENT_AMBULATORY_CARE_PROVIDER_SITE_OTHER): Payer: Medicare Other | Admitting: Ophthalmology

## 2016-01-05 ENCOUNTER — Encounter (INDEPENDENT_AMBULATORY_CARE_PROVIDER_SITE_OTHER): Payer: Medicare Other | Admitting: Ophthalmology

## 2016-01-05 DIAGNOSIS — H2513 Age-related nuclear cataract, bilateral: Secondary | ICD-10-CM

## 2016-01-05 DIAGNOSIS — I10 Essential (primary) hypertension: Secondary | ICD-10-CM | POA: Diagnosis not present

## 2016-01-05 DIAGNOSIS — H43813 Vitreous degeneration, bilateral: Secondary | ICD-10-CM | POA: Diagnosis not present

## 2016-01-05 DIAGNOSIS — H35033 Hypertensive retinopathy, bilateral: Secondary | ICD-10-CM

## 2016-01-05 DIAGNOSIS — E113511 Type 2 diabetes mellitus with proliferative diabetic retinopathy with macular edema, right eye: Secondary | ICD-10-CM

## 2016-01-05 DIAGNOSIS — E11311 Type 2 diabetes mellitus with unspecified diabetic retinopathy with macular edema: Secondary | ICD-10-CM | POA: Diagnosis not present

## 2016-01-05 DIAGNOSIS — E113312 Type 2 diabetes mellitus with moderate nonproliferative diabetic retinopathy with macular edema, left eye: Secondary | ICD-10-CM | POA: Diagnosis not present

## 2016-02-02 ENCOUNTER — Encounter (INDEPENDENT_AMBULATORY_CARE_PROVIDER_SITE_OTHER): Payer: Medicare Other | Admitting: Ophthalmology

## 2016-02-09 ENCOUNTER — Encounter (INDEPENDENT_AMBULATORY_CARE_PROVIDER_SITE_OTHER): Payer: Medicare Other | Admitting: Ophthalmology

## 2016-02-09 DIAGNOSIS — E11311 Type 2 diabetes mellitus with unspecified diabetic retinopathy with macular edema: Secondary | ICD-10-CM

## 2016-02-09 DIAGNOSIS — H43813 Vitreous degeneration, bilateral: Secondary | ICD-10-CM

## 2016-02-09 DIAGNOSIS — H35033 Hypertensive retinopathy, bilateral: Secondary | ICD-10-CM | POA: Diagnosis not present

## 2016-02-09 DIAGNOSIS — E113511 Type 2 diabetes mellitus with proliferative diabetic retinopathy with macular edema, right eye: Secondary | ICD-10-CM

## 2016-02-09 DIAGNOSIS — I10 Essential (primary) hypertension: Secondary | ICD-10-CM

## 2016-02-09 DIAGNOSIS — E113312 Type 2 diabetes mellitus with moderate nonproliferative diabetic retinopathy with macular edema, left eye: Secondary | ICD-10-CM

## 2016-02-09 DIAGNOSIS — H2513 Age-related nuclear cataract, bilateral: Secondary | ICD-10-CM

## 2016-03-08 ENCOUNTER — Encounter (INDEPENDENT_AMBULATORY_CARE_PROVIDER_SITE_OTHER): Payer: Medicare Other | Admitting: Ophthalmology

## 2016-03-08 DIAGNOSIS — E11311 Type 2 diabetes mellitus with unspecified diabetic retinopathy with macular edema: Secondary | ICD-10-CM

## 2016-03-08 DIAGNOSIS — H35033 Hypertensive retinopathy, bilateral: Secondary | ICD-10-CM | POA: Diagnosis not present

## 2016-03-08 DIAGNOSIS — H43813 Vitreous degeneration, bilateral: Secondary | ICD-10-CM

## 2016-03-08 DIAGNOSIS — E113312 Type 2 diabetes mellitus with moderate nonproliferative diabetic retinopathy with macular edema, left eye: Secondary | ICD-10-CM | POA: Diagnosis not present

## 2016-03-08 DIAGNOSIS — E113511 Type 2 diabetes mellitus with proliferative diabetic retinopathy with macular edema, right eye: Secondary | ICD-10-CM | POA: Diagnosis not present

## 2016-03-08 DIAGNOSIS — H2513 Age-related nuclear cataract, bilateral: Secondary | ICD-10-CM

## 2016-03-08 DIAGNOSIS — I10 Essential (primary) hypertension: Secondary | ICD-10-CM

## 2016-04-19 ENCOUNTER — Encounter (INDEPENDENT_AMBULATORY_CARE_PROVIDER_SITE_OTHER): Payer: Medicare Other | Admitting: Ophthalmology

## 2016-04-19 DIAGNOSIS — I10 Essential (primary) hypertension: Secondary | ICD-10-CM | POA: Diagnosis not present

## 2016-04-19 DIAGNOSIS — H2513 Age-related nuclear cataract, bilateral: Secondary | ICD-10-CM | POA: Diagnosis not present

## 2016-04-19 DIAGNOSIS — E11311 Type 2 diabetes mellitus with unspecified diabetic retinopathy with macular edema: Secondary | ICD-10-CM | POA: Diagnosis not present

## 2016-04-19 DIAGNOSIS — E113511 Type 2 diabetes mellitus with proliferative diabetic retinopathy with macular edema, right eye: Secondary | ICD-10-CM

## 2016-04-19 DIAGNOSIS — E113312 Type 2 diabetes mellitus with moderate nonproliferative diabetic retinopathy with macular edema, left eye: Secondary | ICD-10-CM | POA: Diagnosis not present

## 2016-04-19 DIAGNOSIS — H43813 Vitreous degeneration, bilateral: Secondary | ICD-10-CM

## 2016-04-19 DIAGNOSIS — H35033 Hypertensive retinopathy, bilateral: Secondary | ICD-10-CM

## 2016-05-31 ENCOUNTER — Encounter (INDEPENDENT_AMBULATORY_CARE_PROVIDER_SITE_OTHER): Payer: Medicare Other | Admitting: Ophthalmology

## 2016-06-06 ENCOUNTER — Encounter (INDEPENDENT_AMBULATORY_CARE_PROVIDER_SITE_OTHER): Payer: Medicare Other | Admitting: Ophthalmology

## 2016-06-06 DIAGNOSIS — H35033 Hypertensive retinopathy, bilateral: Secondary | ICD-10-CM | POA: Diagnosis not present

## 2016-06-06 DIAGNOSIS — H43813 Vitreous degeneration, bilateral: Secondary | ICD-10-CM | POA: Diagnosis not present

## 2016-06-06 DIAGNOSIS — E113511 Type 2 diabetes mellitus with proliferative diabetic retinopathy with macular edema, right eye: Secondary | ICD-10-CM

## 2016-06-06 DIAGNOSIS — E113212 Type 2 diabetes mellitus with mild nonproliferative diabetic retinopathy with macular edema, left eye: Secondary | ICD-10-CM | POA: Diagnosis not present

## 2016-06-06 DIAGNOSIS — I10 Essential (primary) hypertension: Secondary | ICD-10-CM | POA: Diagnosis not present

## 2016-06-06 DIAGNOSIS — E11311 Type 2 diabetes mellitus with unspecified diabetic retinopathy with macular edema: Secondary | ICD-10-CM

## 2016-07-18 ENCOUNTER — Encounter (INDEPENDENT_AMBULATORY_CARE_PROVIDER_SITE_OTHER): Payer: Medicare Other | Admitting: Ophthalmology

## 2016-08-16 ENCOUNTER — Encounter (INDEPENDENT_AMBULATORY_CARE_PROVIDER_SITE_OTHER): Payer: Medicare Other | Admitting: Ophthalmology

## 2017-07-19 ENCOUNTER — Ambulatory Visit
Admission: RE | Admit: 2017-07-19 | Discharge: 2017-07-19 | Disposition: A | Discharge status: Home / Self Care | Payer: Medicare Other | Source: Ambulatory Visit | Attending: Internal Medicine | Admitting: Internal Medicine

## 2017-07-19 ENCOUNTER — Other Ambulatory Visit: Payer: Self-pay | Admitting: Internal Medicine

## 2017-07-19 DIAGNOSIS — M545 Low back pain: Secondary | ICD-10-CM

## 2018-03-04 ENCOUNTER — Other Ambulatory Visit: Payer: Self-pay | Admitting: Internal Medicine

## 2018-03-04 ENCOUNTER — Ambulatory Visit
Admission: RE | Admit: 2018-03-04 | Discharge: 2018-03-04 | Disposition: A | Discharge status: Home / Self Care | Payer: Medicare Other | Source: Ambulatory Visit | Attending: Internal Medicine | Admitting: Internal Medicine

## 2018-03-04 DIAGNOSIS — R109 Unspecified abdominal pain: Secondary | ICD-10-CM

## 2019-03-18 ENCOUNTER — Emergency Department (HOSPITAL_COMMUNITY): Payer: Medicare Other

## 2019-03-18 ENCOUNTER — Emergency Department (HOSPITAL_COMMUNITY)
Admission: EM | Admit: 2019-03-18 | Discharge: 2019-03-18 | Disposition: A | Discharge status: Home / Self Care | Payer: Medicare Other | Attending: Emergency Medicine | Admitting: Emergency Medicine

## 2019-03-18 ENCOUNTER — Other Ambulatory Visit: Payer: Self-pay

## 2019-03-18 ENCOUNTER — Encounter (HOSPITAL_COMMUNITY): Payer: Self-pay

## 2019-03-18 DIAGNOSIS — Y92012 Bathroom of single-family (private) house as the place of occurrence of the external cause: Secondary | ICD-10-CM | POA: Diagnosis not present

## 2019-03-18 DIAGNOSIS — Z794 Long term (current) use of insulin: Secondary | ICD-10-CM | POA: Insufficient documentation

## 2019-03-18 DIAGNOSIS — W01190A Fall on same level from slipping, tripping and stumbling with subsequent striking against furniture, initial encounter: Secondary | ICD-10-CM | POA: Diagnosis not present

## 2019-03-18 DIAGNOSIS — Z79899 Other long term (current) drug therapy: Secondary | ICD-10-CM | POA: Diagnosis not present

## 2019-03-18 DIAGNOSIS — R42 Dizziness and giddiness: Secondary | ICD-10-CM | POA: Diagnosis not present

## 2019-03-18 DIAGNOSIS — E1122 Type 2 diabetes mellitus with diabetic chronic kidney disease: Secondary | ICD-10-CM | POA: Insufficient documentation

## 2019-03-18 DIAGNOSIS — R0789 Other chest pain: Secondary | ICD-10-CM | POA: Diagnosis not present

## 2019-03-18 DIAGNOSIS — Y939 Activity, unspecified: Secondary | ICD-10-CM | POA: Diagnosis not present

## 2019-03-18 DIAGNOSIS — I129 Hypertensive chronic kidney disease with stage 1 through stage 4 chronic kidney disease, or unspecified chronic kidney disease: Secondary | ICD-10-CM | POA: Diagnosis not present

## 2019-03-18 DIAGNOSIS — N189 Chronic kidney disease, unspecified: Secondary | ICD-10-CM | POA: Diagnosis not present

## 2019-03-18 DIAGNOSIS — Y999 Unspecified external cause status: Secondary | ICD-10-CM | POA: Diagnosis not present

## 2019-03-18 LAB — CBC
HCT: 39.8 % (ref 36.0–46.0)
Hemoglobin: 12.8 g/dL (ref 12.0–15.0)
MCH: 34 pg (ref 26.0–34.0)
MCHC: 32.2 g/dL (ref 30.0–36.0)
MCV: 105.6 fL — ABNORMAL HIGH (ref 80.0–100.0)
Platelets: 232 10*3/uL (ref 150–400)
RBC: 3.77 MIL/uL — ABNORMAL LOW (ref 3.87–5.11)
RDW: 13.2 % (ref 11.5–15.5)
WBC: 6.7 10*3/uL (ref 4.0–10.5)
nRBC: 0 % (ref 0.0–0.2)

## 2019-03-18 LAB — BASIC METABOLIC PANEL WITH GFR
Anion gap: 12 (ref 5–15)
BUN: 26 mg/dL — ABNORMAL HIGH (ref 8–23)
CO2: 22 mmol/L (ref 22–32)
Calcium: 8.9 mg/dL (ref 8.9–10.3)
Chloride: 105 mmol/L (ref 98–111)
Creatinine, Ser: 2.17 mg/dL — ABNORMAL HIGH (ref 0.44–1.00)
GFR calc Af Amer: 24 mL/min — ABNORMAL LOW
GFR calc non Af Amer: 21 mL/min — ABNORMAL LOW
Glucose, Bld: 163 mg/dL — ABNORMAL HIGH (ref 70–99)
Potassium: 4 mmol/L (ref 3.5–5.1)
Sodium: 139 mmol/L (ref 135–145)

## 2019-03-18 LAB — TROPONIN I (HIGH SENSITIVITY)
Troponin I (High Sensitivity): 36 ng/L — ABNORMAL HIGH
Troponin I (High Sensitivity): 35 ng/L — ABNORMAL HIGH

## 2019-03-18 MED ORDER — DICLOFENAC SODIUM 1 % TD GEL: 4.0000 g | Freq: Four times a day (QID) | TRANSDERMAL | 0 refills | Status: DC

## 2019-03-18 MED ORDER — DICLOFENAC SODIUM 1 % TD GEL
4.0000 g | Freq: Once | TRANSDERMAL | Status: AC
Start: 2019-03-18 — End: 2019-03-18
  Administered 2019-03-18: 22:00:00 4 g via TOPICAL
  Filled 2019-03-18: qty 100

## 2019-03-18 MED ORDER — SODIUM CHLORIDE 0.9 % IV BOLUS
1000.0000 mL | Freq: Once | INTRAVENOUS | Status: AC
  Administered 2019-03-18: 1000 mL via INTRAVENOUS

## 2019-03-18 MED ORDER — SODIUM CHLORIDE 0.9% FLUSH
3.0000 mL | Freq: Once | INTRAVENOUS | Status: AC
  Administered 2019-03-18: 3 mL via INTRAVENOUS

## 2019-03-18 MED ORDER — ACETAMINOPHEN 500 MG PO TABS
1000.0000 mg | ORAL_TABLET | Freq: Once | ORAL | Status: AC
  Administered 2019-03-18: 1000 mg via ORAL
  Filled 2019-03-18: qty 2

## 2019-03-18 NOTE — Discharge Instructions (Signed)
Take tylenol up to 1000 mg 4 times a day for the pain.  Use the incentive spirometer 10 minutes out of every hour while you are awake.  Please return for worsening shortness of breath fever or cough.  Follow-up with your family physician.

## 2019-03-18 NOTE — ED Provider Notes (Signed)
[name] EMERGENCY DEPARTMENT Provider Note   CSN: [id number] Arrival date & time: 03/18/19  1609    History   Chief Complaint Chief Complaint  Patient presents with  . Chest Pain    HPI Donna Green is a 78 y.o. female.     78 yo F with a chief complaint of of a fall.  This occurred 5 days ago.  Patient has had episodes where she has been feeling lightheaded.  She thinks is due to her blood pressure.  Has seen a neurologist to thinks it is the same.  Was recommended to see her family doctor and be taken off of it but has not yet occurred.  Has an appointment tomorrow.  Patient complaining mostly of chest pain.  Feels like a band that wraps around her chest.  Worse with deep breaths movement or palpation.  She denies any other injury in the fall.  States that she did strike her head but has not been having significant headaches.  No nausea vomiting or diarrhea.  No decreased oral intake.  No fevers cough.  The history is provided by the patient.  Chest Pain Pain location:  Substernal area Pain quality: aching   Pain radiates to:  Does not radiate Pain severity:  Moderate Onset quality:  Gradual Duration:  5 days Timing:  Constant Progression:  Worsening Chronicity:  New Relieved by:  Nothing Worsened by:  Nothing Ineffective treatments:  None tried Associated symptoms: no dizziness, no fever, no headache, no nausea, no palpitations, no shortness of breath and no vomiting     Past Medical History:  Diagnosis Date  . Chronic kidney disease   . Diabetes mellitus without complication ([place])   . Headache   . Hyperlipidemia   . Hypertension   . Right hemiparesis ([place])   . Small bowel obstruction ([place])   . Stroke [facility])    x2    Patient Active Problem List   Diagnosis Date Noted  . Essential hypertension 03/09/2015  . HLD (hyperlipidemia) 03/09/2015  . Type 2 diabetes mellitus with other circulatory complications ([place]) 03/83/3383  . History of  stroke 03/07/2015    Past Surgical History:  Procedure Laterality Date  . BLADDER SURGERY    . BOWEL RESECTION    . EYE SURGERY    . TUBAL LIGATION       OB History   No obstetric history on file.      Home Medications    Prior to Admission medications   Medication Sig Start Date End Date Taking? Authorizing Provider  acetaminophen (TYLENOL) 325 MG tablet Take 650 mg by mouth every 6 (six) hours as needed for moderate pain.    [provider]  amLODipine (NORVASC) 10 MG tablet Take 10 mg by mouth daily.    [provider]  CRESTOR 20 MG tablet Take 20 mg by mouth every evening.  12/02/14   [provider]  diclofenac sodium (VOLTAREN) 1 % GEL Apply 4 g topically 4 (four) times daily. 03/18/19   Deno Etienne, DO  dipyridamole-aspirin (AGGRENOX) 200-25 MG per 12 hr capsule Take 1 capsule by mouth 2 (two) times daily.    [provider]  ibandronate (BONIVA) 150 MG tablet Take 150 mg by mouth every 30 (thirty) days. Take in the morning with a full glass of water, on an empty stomach, and do not take anything else by mouth or lie down for the next 30 min.    [provider]  insulin aspart (NOVOLOG FLEXPEN) 100 UNIT/ML FlexPen Inject 4-8 Units into the skin 3 (three) times daily with meals. Sliding scale    [provider]  insulin glargine (LANTUS) 100 UNIT/ML injection Inject 34 Units into the skin at bedtime.     [provider]  LANTUS SOLOSTAR 100 UNIT/ML Solostar Pen  12/20/14   [provider]  LORazepam (ATIVAN) 1 MG tablet Take 1 mg by mouth 2 (two) times daily.    [provider]  metoprolol (LOPRESSOR) 100 MG tablet Take 100 mg by mouth 2 (two) times daily.    [provider]  pregabalin (LYRICA) 50 MG capsule Take 50 mg by mouth 2 (two) times daily.    [provider]  valsartan-hydrochlorothiazide (DIOVAN-HCT) 320-25 MG per tablet Take 1 tablet by mouth daily.    [provider]  ZOSTAVAX 78295 UNT/0.65ML injection Inject 1 each into the muscle once. 12/02/14   [provider]    Family History Family History  Problem Relation Age of Onset  . Diabetes Mother   . Heart disease Mother   . Parkinsonism Father   . Diabetes Sister   . Diabetes Brother   . Cancer Brother   . Diabetes Maternal Grandmother     Social History Social History   Tobacco Use  . Smoking status: Never Smoker  . Smokeless tobacco: Never Used  Substance Use Topics  . Alcohol use: No  . Drug use: No     Allergies   Codeine, Other, Allopurinol, and Colcrys [colchicine]   Review of Systems Review of Systems  Constitutional: Negative for chills and fever.  HENT: Negative for congestion and rhinorrhea.   Eyes: Negative for redness and visual disturbance.  Respiratory: Negative for shortness of breath and wheezing.   Cardiovascular: Positive for chest pain. Negative for palpitations.  Gastrointestinal: Negative for nausea and vomiting.  Genitourinary: Negative for dysuria and urgency.  Musculoskeletal: Negative for arthralgias and myalgias.  Skin: Negative for pallor and wound.  Neurological: Negative for dizziness and headaches.     Physical Exam Updated Vital Signs BP 138/70   Pulse 72   Temp 98.8 F (37.1 C) (Oral)   Resp 16   SpO2 95%   Physical Exam Vitals signs and nursing note reviewed.  Constitutional:      General: She is not in acute distress.    Appearance: She is well-developed. She is not diaphoretic.  HENT:     Head: Normocephalic and atraumatic.  Eyes:     Pupils: Pupils are equal, round, and reactive to light.  Neck:     Musculoskeletal: Normal range of motion and neck supple.  Cardiovascular:     Rate and Rhythm: Normal rate and regular rhythm.     Heart sounds: No murmur. No friction rub. No gallop.   Pulmonary:     Effort: Pulmonary effort is normal.     Breath sounds: No wheezing or rales.  Abdominal:     General: There is no  distension.     Palpations: Abdomen is soft.     Tenderness: There is no abdominal tenderness.  Musculoskeletal:        General: No tenderness.  Skin:    General: Skin is warm and dry.  Neurological:     Mental Status: She is alert and oriented to person, place, and time.     GCS: GCS eye subscore is 4. GCS verbal subscore is 5. GCS motor subscore is 6.     Cranial Nerves:  Cranial nerves are intact.     Sensory: Sensation is intact.     Motor: Motor function is intact.     Coordination: Coordination is intact.     Gait: Gait is intact.  Psychiatric:        Behavior: Behavior normal.      ED Treatments / Results  Labs (all labs ordered are listed, but only abnormal results are displayed) Labs Reviewed  BASIC METABOLIC PANEL - Abnormal; Notable for the following components:      Result Value   Glucose, Bld 163 (*)    BUN 26 (*)    Creatinine, Ser 2.17 (*)    GFR calc non Af Amer 21 (*)    GFR calc Af Amer 24 (*)    All other components within normal limits  CBC - Abnormal; Notable for the following components:   RBC 3.77 (*)    MCV 105.6 (*)    All other components within normal limits  TROPONIN I (HIGH SENSITIVITY) - Abnormal; Notable for the following components:   Troponin I (High Sensitivity) 36 (*)    All other components within normal limits  TROPONIN I (HIGH SENSITIVITY) - Abnormal; Notable for the following components:   Troponin I (High Sensitivity) 35 (*)    All other components within normal limits    EKG EKG Interpretation  Date/Time:  Wednesday March 18 2019 16:16:07 EDT Ventricular Rate:  71 PR Interval:  144 QRS Duration: 74 QT Interval:  404 QTC Calculation: 439 R Axis:   -2 Text Interpretation:  Normal sinus rhythm Normal ECG no wpw, prolonged qt or brugada No significant change since last tracing Confirmed by Deno Etienne [phone]) on 03/18/2019 6:47:56 PM   Radiology Dg Chest 2 View  Result Date: 03/18/2019 CLINICAL DATA:  Chest pain since a  fall 5 days ago, striking her chest on a bathroom cabinet at that time. EXAM: CHEST - 2 VIEW COMPARISON:  Chest x-rays dated 04/23/2010 and 04/18/2010 FINDINGS: Heart size and pulmonary vascularity are normal. Tortuosity and calcification of the thoracic aorta. Large hiatal hernia. Slight atelectasis at the right base. No discrete fractures. IMPRESSION: 1. Slight atelectasis at the right base. 2. Large hiatal hernia. 3. Aortic atherosclerosis. A Electronically Signed   By: Lorriane Shire M.D.   On: 03/18/2019 16:54   Ct Head Wo Contrast  Result Date: 03/18/2019 CLINICAL DATA:  Altered LOC EXAM: CT HEAD WITHOUT CONTRAST TECHNIQUE: Contiguous axial images were obtained from the base of the skull through the vertex without intravenous contrast. COMPARISON:  MRI 12/17/2014, CT brain 12/17/2014 FINDINGS: Brain: No acute territorial infarction, hemorrhage, or intracranial mass. Mild atrophy. Mild small vessel ischemic changes of the white matter. Stable ventricle size. Vascular: No hyperdense vessels. Carotid vascular and vertebral artery calcification Skull: Normal. Negative for fracture or focal lesion. 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 Other: None IMPRESSION: 1. No CT evidence for acute intracranial abnormality. 2. Atrophy and mild small vessel ischemic changes of the white matter Electronically Signed   By: Donavan Foil M.D.   On: 03/18/2019 20:31    Procedures Procedures (including critical care time)  Medications Ordered in ED Medications  acetaminophen (TYLENOL) tablet 1,000 mg (has no administration in time range)  diclofenac sodium (VOLTAREN) 1 % transdermal gel 4 g (has no administration in time range)  sodium chloride flush (NS) 0.9 % injection 3 mL (3 mLs Intravenous Given 03/18/19 1859)  sodium chloride 0.9 % bolus 1,000 mL (1,000 mLs Intravenous New Bag/Given 03/18/19  1928)     Initial Impression / Assessment and Plan / ED Course  I have reviewed the triage vital signs  and the nursing notes.  Pertinent labs & imaging results that were available during my care of the patient were reviewed by me and considered in my medical decision making (see chart for details).        78 yo F with a chief complaints of a fall.  This happened 5 days ago.  Patient is been having trouble with lightheadedness.  Thought to be secondary to her anti-hypertensives.  Patient chest x-ray viewed by me without focal true pneumothorax.  Patient's lab work-up thus far looks unremarkable.  She has a very mildly elevated troponin.  Will obtain a delta.  Pain seems very atypical from ACS.  Unlikely to be a PE.  CT the head with the patient feeling unsteady.  CT the head is unremarkable.  Will discharge patient home.  PCP follow-up.  8:41 PM:  I have discussed the diagnosis/risks/treatment options with the patient and believe the pt to be eligible for discharge home to follow-up with PCP. We also discussed returning to the ED immediately if new or worsening sx occur. We discussed the sx which are most concerning (e.g., sudden worsening pain, fever, inability to tolerate by mouth) that necessitate immediate return. Medications administered to the patient during their visit and any new prescriptions provided to the patient are listed below.  Medications given during this visit Medications  acetaminophen (TYLENOL) tablet 1,000 mg (has no administration in time range)  diclofenac sodium (VOLTAREN) 1 % transdermal gel 4 g (has no administration in time range)  sodium chloride flush (NS) 0.9 % injection 3 mL (3 mLs Intravenous Given 03/18/19 1859)  sodium chloride 0.9 % bolus 1,000 mL (1,000 mLs Intravenous New Bag/Given 03/18/19 1928)     The patient appears reasonably screen and/or stabilized for discharge and I doubt any other medical condition or other [facility] requiring further screening, evaluation, or treatment in the ED at this time prior to discharge.    Final Clinical Impressions(s) / ED  Diagnoses   Final diagnoses:  Chest wall pain    ED Discharge Orders         Ordered    diclofenac sodium (VOLTAREN) 1 % GEL  4 times daily     03/18/19 2039           Deno Etienne, DO 03/18/19 2041

## 2019-03-18 NOTE — ED Triage Notes (Signed)
Onset 5 days ago pt was walking from bed into bathroom and fell, hitting head and chest on bathroom cabinet.  Pt does not know why she fell.  Denies getting dizzy.  Pt states she briefly passed out.  C/o chest pain.

## 2019-07-21 IMAGING — CR DG LUMBAR SPINE 2-3V
3 series · 3 of 3 positions shown · non-contrast
Comparison: 02/04/2013

CLINICAL DATA: Low back pain with radiation into left hip for
several months, multiple previous falls, initial encounter

EXAM:
LUMBAR SPINE - 2-3 VIEW

[t l-spine a.p.]
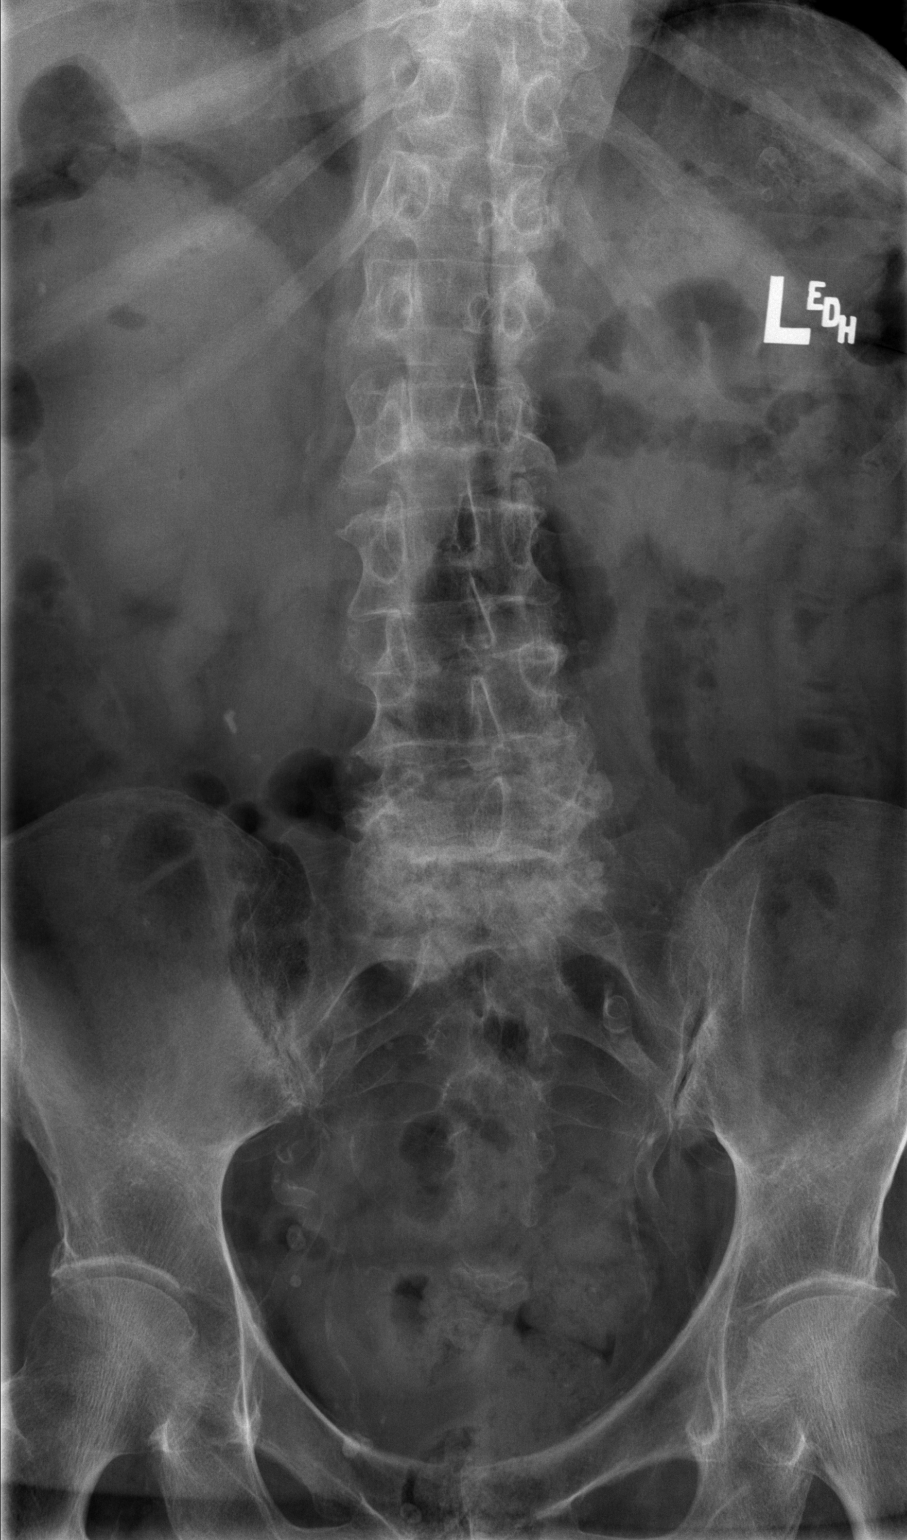

[t l-spine lat]
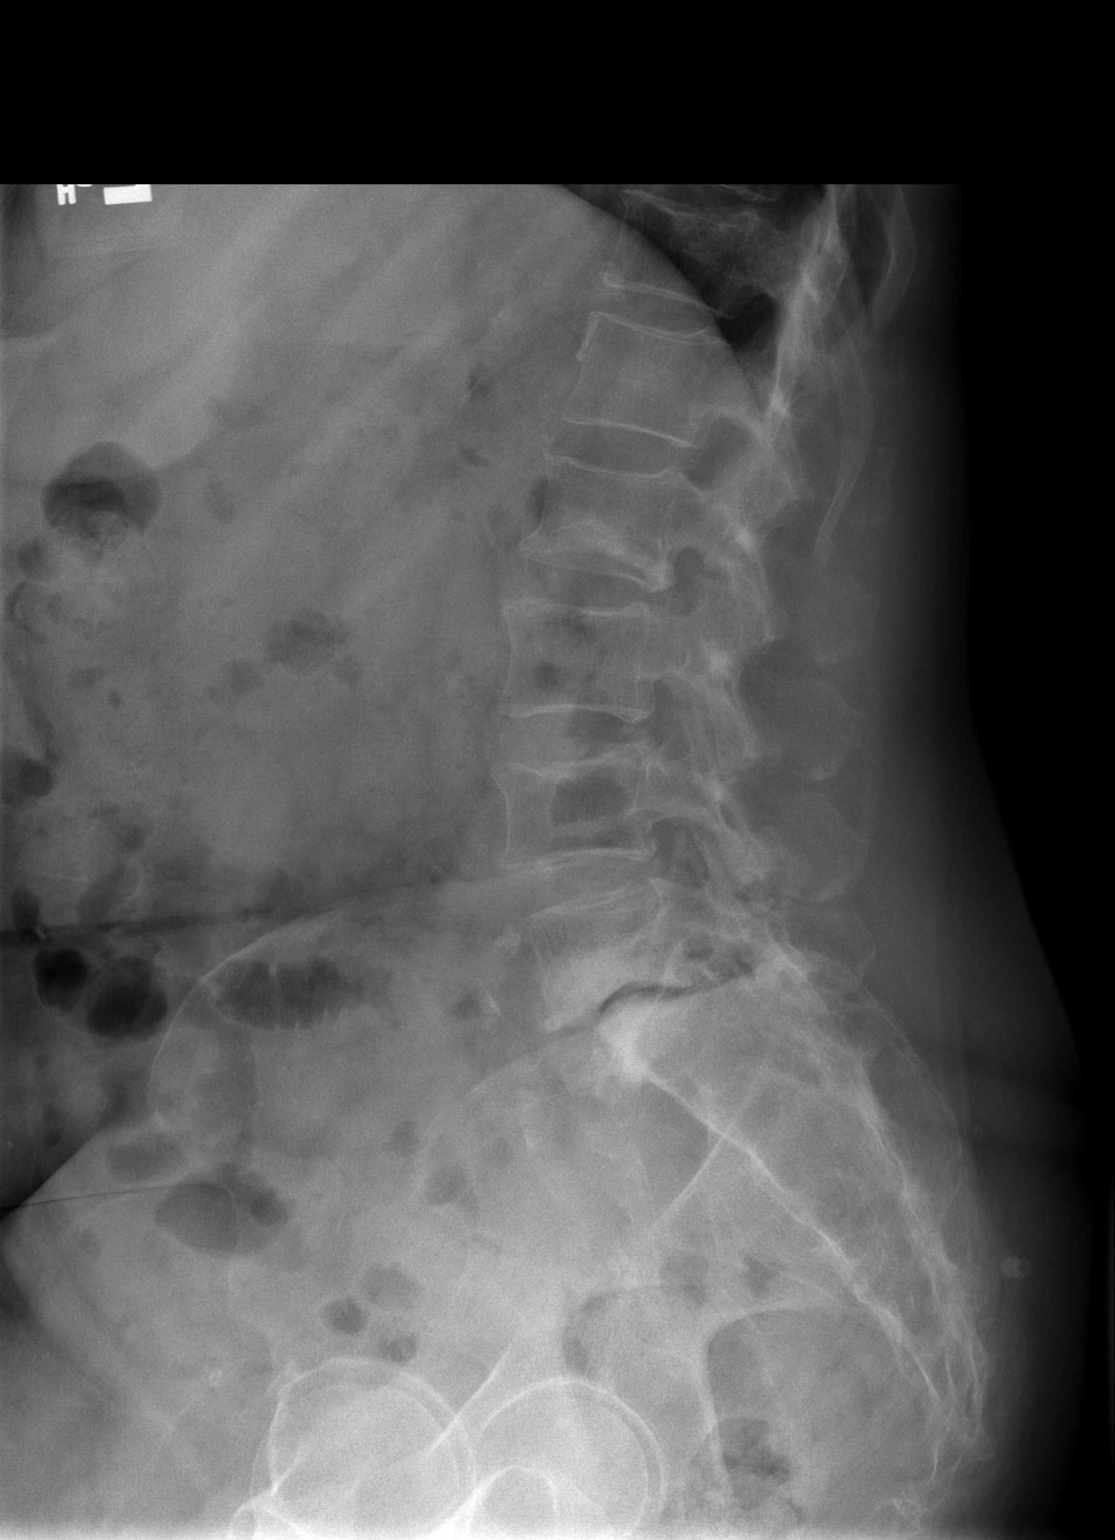

[t l-spine l5-s1 spot]
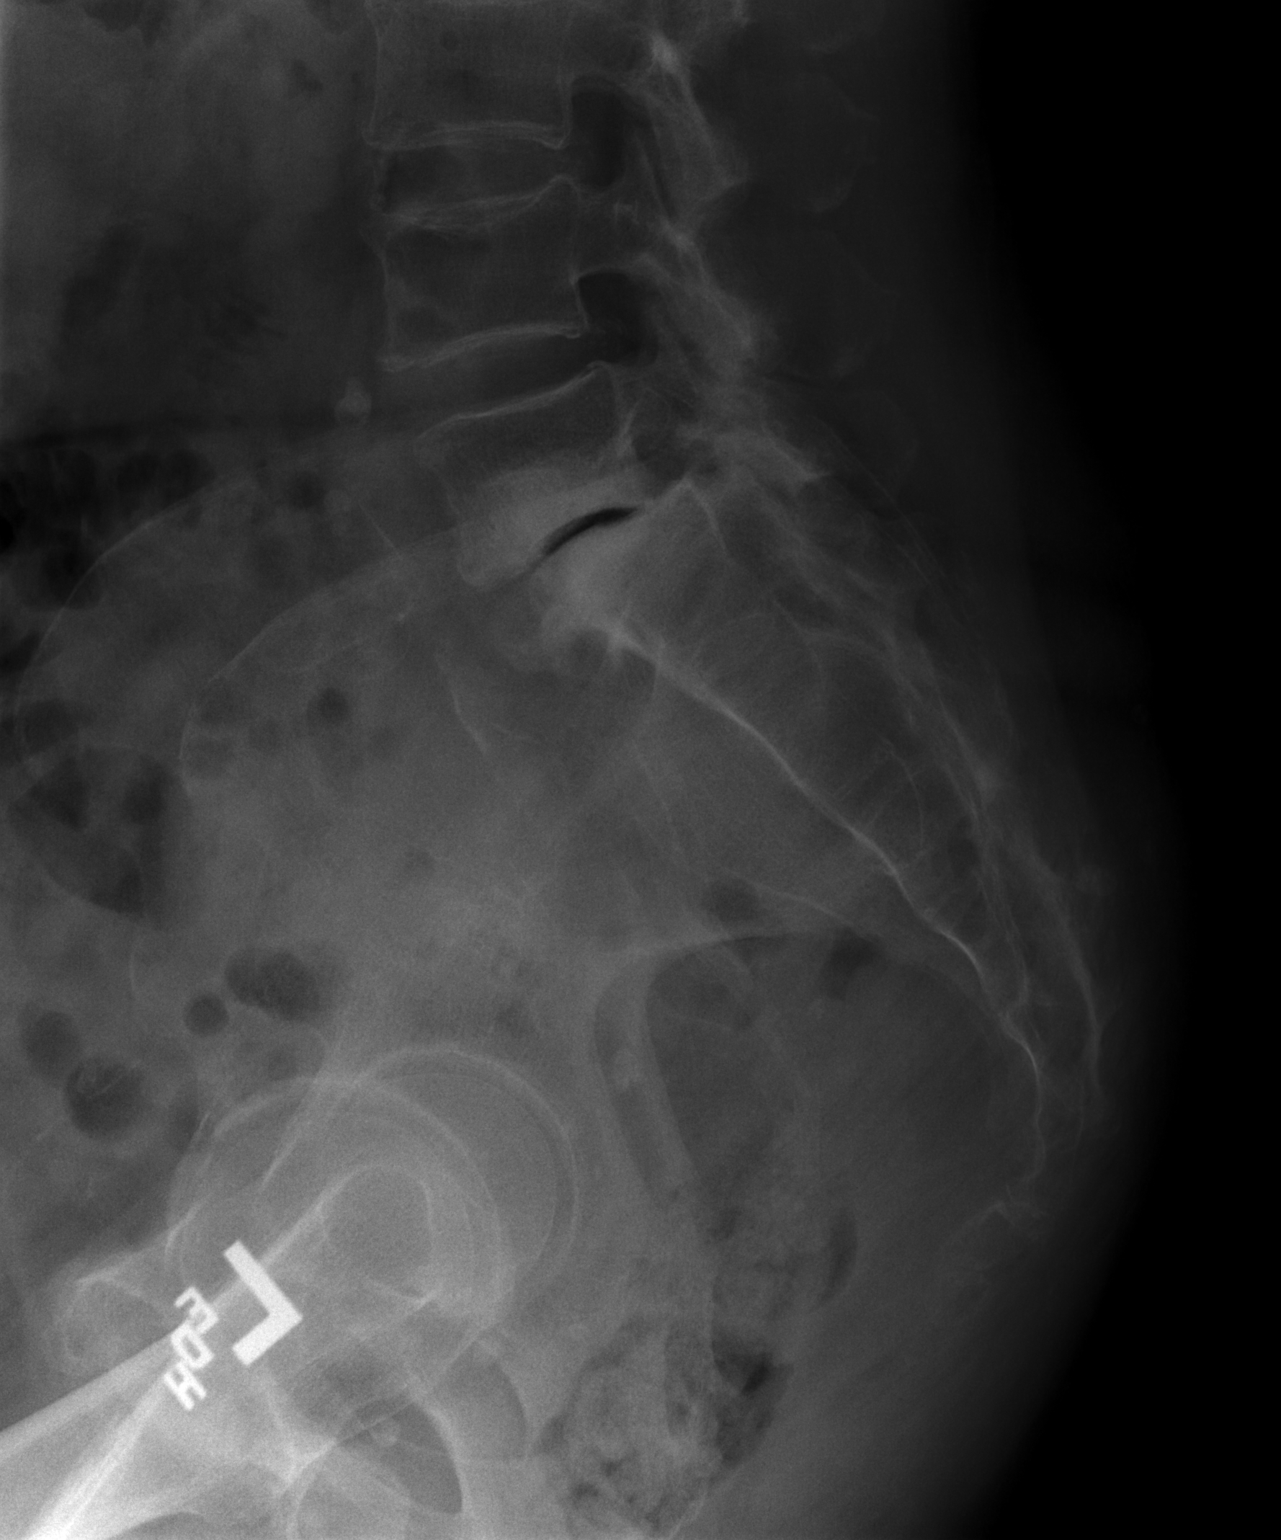

[3 of 3 positions shown; findings below may reference images not displayed]

FINDINGS: Five lumbar type vertebral bodies are well visualized. Inferior
endplate compression is noted at L2 stable from the prior exam.
Superior endplate compression deformity is noted at L4 also stable
from the prior exam. No new fractures are seen. Grade 2
anterolisthesis of L5 on S1 is noted likely of a degenerative
nature. The overall appearance has increased in the interval from
the prior exam.
IMPRESSION: Slight increase in anterolisthesis at L5-S1 when compared with the
prior exam.

Stable endplate deformities are noted at L2 and L4.

## 2019-10-04 ENCOUNTER — Ambulatory Visit: Payer: Medicare PPO | Attending: Internal Medicine

## 2019-10-08 ENCOUNTER — Ambulatory Visit: Payer: Medicare PPO | Attending: Internal Medicine

## 2019-10-08 DIAGNOSIS — Z23 Encounter for immunization: Secondary | ICD-10-CM | POA: Insufficient documentation

## 2019-10-08 NOTE — Progress Notes (Signed)
   Covid-19 Vaccination Clinic  Name:  Donna Green    MRN: [id number] DOB: 02-Oct-1940  10/08/2019  [name] was observed post Covid-19 immunization for 15 minutes without incidence. She was provided with Vaccine Information Sheet and instruction to access the V-Safe system.   [name] was instructed to call 911 with any severe reactions post vaccine: Marland Kitchen Difficulty breathing  . Swelling of your face and throat  . A fast heartbeat  . A bad rash all over your body  . Dizziness and weakness    Immunizations Administered    Name Date Dose VIS Date Route   Pfizer COVID-19 Vaccine 10/08/2019 12:43 PM 0.3 mL 07/24/2019 Intramuscular   Manufacturer: [place]   Lot: [id number]   [place]: [id number]

## 2019-11-03 ENCOUNTER — Ambulatory Visit: Payer: Medicare PPO | Attending: Internal Medicine

## 2019-11-03 DIAGNOSIS — Z23 Encounter for immunization: Secondary | ICD-10-CM

## 2019-11-03 NOTE — Progress Notes (Signed)
   Covid-19 Vaccination Clinic  Name:  Donna Green    MRN: [id number] DOB: August 08, 1941  11/03/2019  [name] was observed post Covid-19 immunization for 15 minutes without incident. She was provided with Vaccine Information Sheet and instruction to access the V-Safe system.   [name] was instructed to call 911 with any severe reactions post vaccine: Marland Kitchen Difficulty breathing  . Swelling of face and throat  . A fast heartbeat  . A bad rash all over body  . Dizziness and weakness   Immunizations Administered    Name Date Dose VIS Date Route   Pfizer COVID-19 Vaccine 11/03/2019  1:52 PM 0.3 mL 07/24/2019 Intramuscular   Manufacturer: [place]   Lot: [id number]   [place]: [id number]

## 2020-03-22 DIAGNOSIS — E7800 Pure hypercholesterolemia, unspecified: Secondary | ICD-10-CM | POA: Diagnosis not present

## 2020-03-22 DIAGNOSIS — M810 Age-related osteoporosis without current pathological fracture: Secondary | ICD-10-CM | POA: Diagnosis not present

## 2020-03-22 DIAGNOSIS — E113299 Type 2 diabetes mellitus with mild nonproliferative diabetic retinopathy without macular edema, unspecified eye: Secondary | ICD-10-CM | POA: Diagnosis not present

## 2020-03-22 DIAGNOSIS — Z0000 Encounter for general adult medical examination without abnormal findings: Secondary | ICD-10-CM | POA: Diagnosis not present

## 2020-03-22 DIAGNOSIS — Z23 Encounter for immunization: Secondary | ICD-10-CM | POA: Diagnosis not present

## 2020-03-22 DIAGNOSIS — F411 Generalized anxiety disorder: Secondary | ICD-10-CM | POA: Diagnosis not present

## 2020-03-22 DIAGNOSIS — Z1389 Encounter for screening for other disorder: Secondary | ICD-10-CM | POA: Diagnosis not present

## 2020-03-22 DIAGNOSIS — I10 Essential (primary) hypertension: Secondary | ICD-10-CM | POA: Diagnosis not present

## 2020-03-22 DIAGNOSIS — E1140 Type 2 diabetes mellitus with diabetic neuropathy, unspecified: Secondary | ICD-10-CM | POA: Diagnosis not present

## 2020-03-22 DIAGNOSIS — N1832 Chronic kidney disease, stage 3b: Secondary | ICD-10-CM | POA: Diagnosis not present

## 2020-03-22 DIAGNOSIS — E1122 Type 2 diabetes mellitus with diabetic chronic kidney disease: Secondary | ICD-10-CM | POA: Diagnosis not present

## 2020-04-12 DIAGNOSIS — E119 Type 2 diabetes mellitus without complications: Secondary | ICD-10-CM | POA: Diagnosis not present

## 2020-04-12 DIAGNOSIS — Z23 Encounter for immunization: Secondary | ICD-10-CM | POA: Diagnosis not present

## 2020-07-13 DIAGNOSIS — H04123 Dry eye syndrome of bilateral lacrimal glands: Secondary | ICD-10-CM | POA: Diagnosis not present

## 2020-07-13 DIAGNOSIS — E113512 Type 2 diabetes mellitus with proliferative diabetic retinopathy with macular edema, left eye: Secondary | ICD-10-CM | POA: Diagnosis not present

## 2020-07-13 DIAGNOSIS — H25812 Combined forms of age-related cataract, left eye: Secondary | ICD-10-CM | POA: Diagnosis not present

## 2020-07-13 DIAGNOSIS — E113591 Type 2 diabetes mellitus with proliferative diabetic retinopathy without macular edema, right eye: Secondary | ICD-10-CM | POA: Diagnosis not present

## 2020-07-13 DIAGNOSIS — Z961 Presence of intraocular lens: Secondary | ICD-10-CM | POA: Diagnosis not present

## 2020-08-03 DIAGNOSIS — R609 Edema, unspecified: Secondary | ICD-10-CM | POA: Diagnosis not present

## 2020-08-03 DIAGNOSIS — E1122 Type 2 diabetes mellitus with diabetic chronic kidney disease: Secondary | ICD-10-CM | POA: Diagnosis not present

## 2020-08-03 DIAGNOSIS — R809 Proteinuria, unspecified: Secondary | ICD-10-CM | POA: Diagnosis not present

## 2020-08-03 DIAGNOSIS — I129 Hypertensive chronic kidney disease with stage 1 through stage 4 chronic kidney disease, or unspecified chronic kidney disease: Secondary | ICD-10-CM | POA: Diagnosis not present

## 2020-08-03 DIAGNOSIS — M109 Gout, unspecified: Secondary | ICD-10-CM | POA: Diagnosis not present

## 2020-08-03 DIAGNOSIS — D472 Monoclonal gammopathy: Secondary | ICD-10-CM | POA: Diagnosis not present

## 2020-08-03 DIAGNOSIS — N184 Chronic kidney disease, stage 4 (severe): Secondary | ICD-10-CM | POA: Diagnosis not present

## 2020-08-03 DIAGNOSIS — N2581 Secondary hyperparathyroidism of renal origin: Secondary | ICD-10-CM | POA: Diagnosis not present

## 2020-08-10 DIAGNOSIS — N184 Chronic kidney disease, stage 4 (severe): Secondary | ICD-10-CM | POA: Diagnosis not present

## 2020-09-20 DIAGNOSIS — E1122 Type 2 diabetes mellitus with diabetic chronic kidney disease: Secondary | ICD-10-CM | POA: Diagnosis not present

## 2020-09-20 DIAGNOSIS — E1140 Type 2 diabetes mellitus with diabetic neuropathy, unspecified: Secondary | ICD-10-CM | POA: Diagnosis not present

## 2020-09-20 DIAGNOSIS — I10 Essential (primary) hypertension: Secondary | ICD-10-CM | POA: Diagnosis not present

## 2020-09-20 DIAGNOSIS — N1832 Chronic kidney disease, stage 3b: Secondary | ICD-10-CM | POA: Diagnosis not present

## 2020-09-20 DIAGNOSIS — E11319 Type 2 diabetes mellitus with unspecified diabetic retinopathy without macular edema: Secondary | ICD-10-CM | POA: Diagnosis not present

## 2020-09-20 DIAGNOSIS — R7989 Other specified abnormal findings of blood chemistry: Secondary | ICD-10-CM | POA: Diagnosis not present

## 2020-09-20 DIAGNOSIS — E7800 Pure hypercholesterolemia, unspecified: Secondary | ICD-10-CM | POA: Diagnosis not present

## 2020-09-20 DIAGNOSIS — N2581 Secondary hyperparathyroidism of renal origin: Secondary | ICD-10-CM | POA: Diagnosis not present

## 2020-09-23 DIAGNOSIS — R7989 Other specified abnormal findings of blood chemistry: Secondary | ICD-10-CM | POA: Diagnosis not present

## 2020-11-07 DIAGNOSIS — N2581 Secondary hyperparathyroidism of renal origin: Secondary | ICD-10-CM | POA: Diagnosis not present

## 2020-11-07 DIAGNOSIS — R809 Proteinuria, unspecified: Secondary | ICD-10-CM | POA: Diagnosis not present

## 2020-11-07 DIAGNOSIS — E1122 Type 2 diabetes mellitus with diabetic chronic kidney disease: Secondary | ICD-10-CM | POA: Diagnosis not present

## 2020-11-07 DIAGNOSIS — R609 Edema, unspecified: Secondary | ICD-10-CM | POA: Diagnosis not present

## 2020-11-07 DIAGNOSIS — N184 Chronic kidney disease, stage 4 (severe): Secondary | ICD-10-CM | POA: Diagnosis not present

## 2020-11-07 DIAGNOSIS — N179 Acute kidney failure, unspecified: Secondary | ICD-10-CM | POA: Diagnosis not present

## 2020-11-07 DIAGNOSIS — I129 Hypertensive chronic kidney disease with stage 1 through stage 4 chronic kidney disease, or unspecified chronic kidney disease: Secondary | ICD-10-CM | POA: Diagnosis not present

## 2020-11-08 DIAGNOSIS — N179 Acute kidney failure, unspecified: Secondary | ICD-10-CM | POA: Diagnosis not present

## 2021-02-08 DIAGNOSIS — R809 Proteinuria, unspecified: Secondary | ICD-10-CM | POA: Diagnosis not present

## 2021-02-08 DIAGNOSIS — N2581 Secondary hyperparathyroidism of renal origin: Secondary | ICD-10-CM | POA: Diagnosis not present

## 2021-02-08 DIAGNOSIS — I129 Hypertensive chronic kidney disease with stage 1 through stage 4 chronic kidney disease, or unspecified chronic kidney disease: Secondary | ICD-10-CM | POA: Diagnosis not present

## 2021-02-08 DIAGNOSIS — N184 Chronic kidney disease, stage 4 (severe): Secondary | ICD-10-CM | POA: Diagnosis not present

## 2021-02-08 DIAGNOSIS — D631 Anemia in chronic kidney disease: Secondary | ICD-10-CM | POA: Diagnosis not present

## 2021-02-08 DIAGNOSIS — E1122 Type 2 diabetes mellitus with diabetic chronic kidney disease: Secondary | ICD-10-CM | POA: Diagnosis not present

## 2021-02-08 DIAGNOSIS — R609 Edema, unspecified: Secondary | ICD-10-CM | POA: Diagnosis not present

## 2021-02-21 DIAGNOSIS — N184 Chronic kidney disease, stage 4 (severe): Secondary | ICD-10-CM | POA: Diagnosis not present

## 2021-03-19 IMAGING — CT CT HEAD WITHOUT CONTRAST
4 series · 16 of 47 positions shown, 18 images · non-contrast
Comparison: MRI 12/17/2014, CT brain 12/17/2014

CLINICAL DATA: Altered LOC

EXAM:
CT HEAD WITHOUT CONTRAST
TECHNIQUE: Contiguous axial images were obtained from the base of the skull
through the vertex without intravenous contrast.

[Series 3: head wo · axial · 0.39mm/px · z∈[+1394,+1509]mm · 7 of 31 slices shown, 9 images]
[im 4/31  brain]
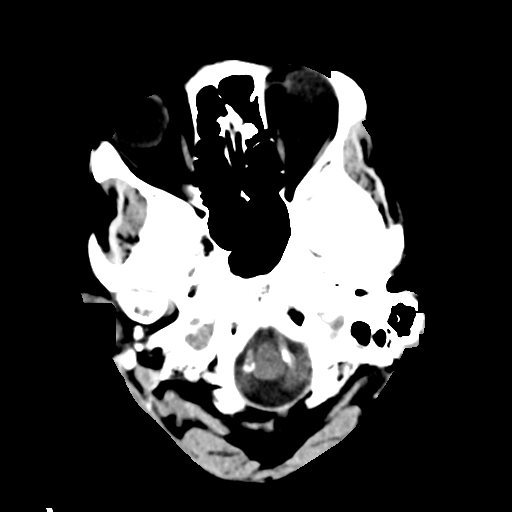
[im 4/31  bone]
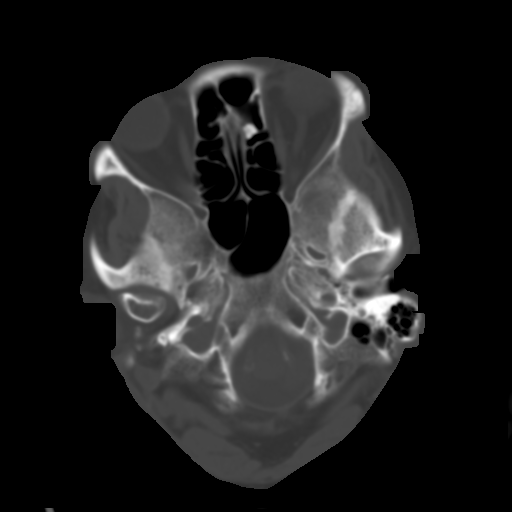
[im 8/31  brain]
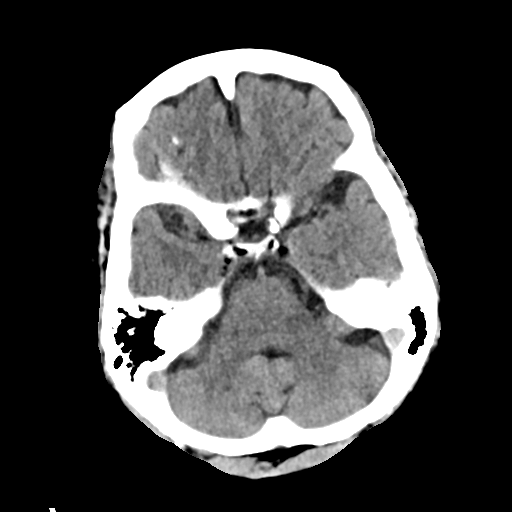
[im 12/31  brain]
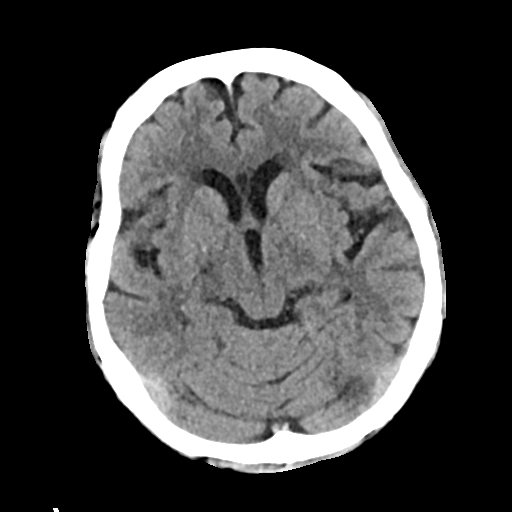
[im 16/31  brain]
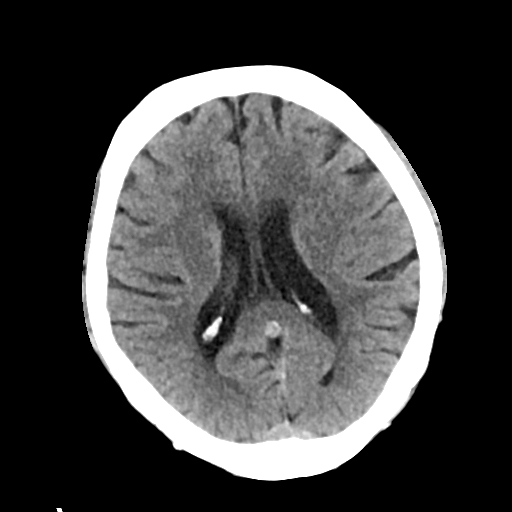
[im 19/31  brain]
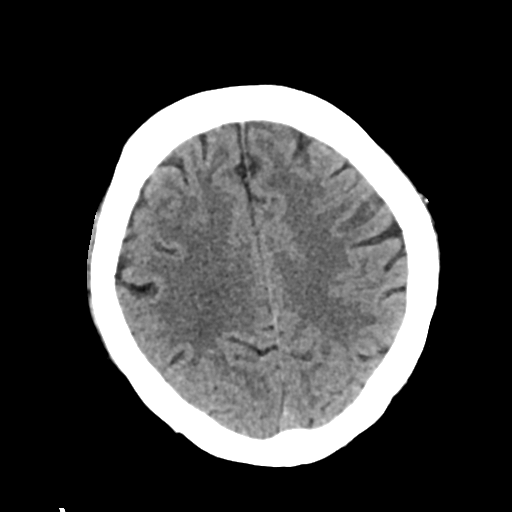
[im 19/31  bone]
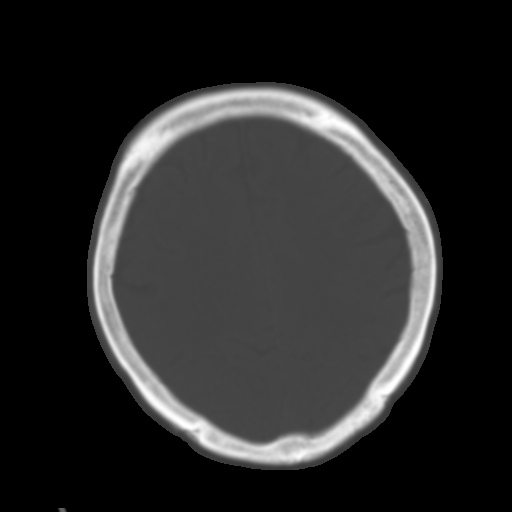
[im 23/31  brain]
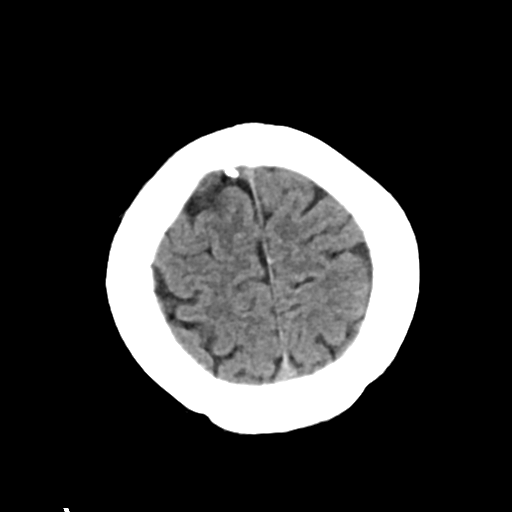
[im 27/31  brain]
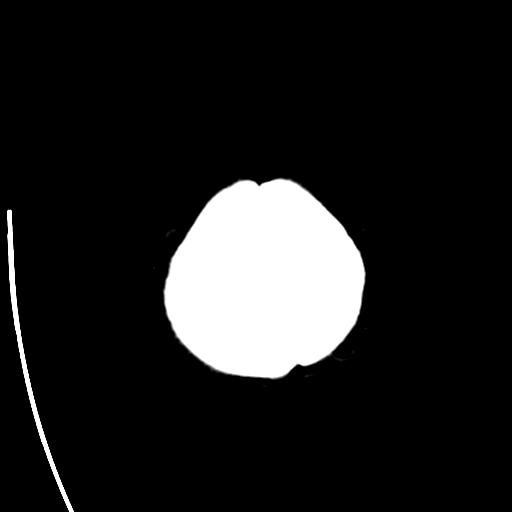

[Series 4: head bone · axial · 0.39mm/px · z∈[+1393,+1423]mm · 3 of 77 slices shown]
[im 8/77  bone]
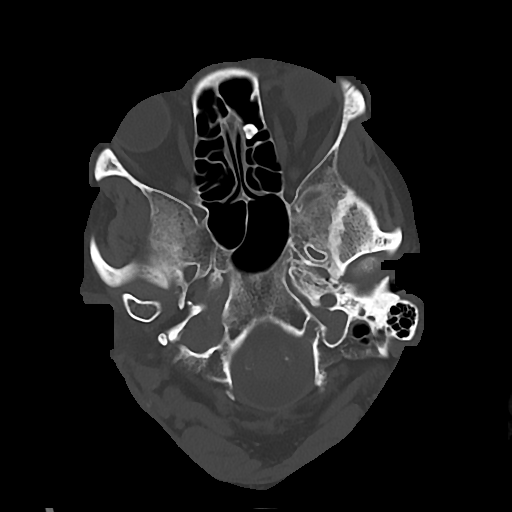
[im 16/77  bone]
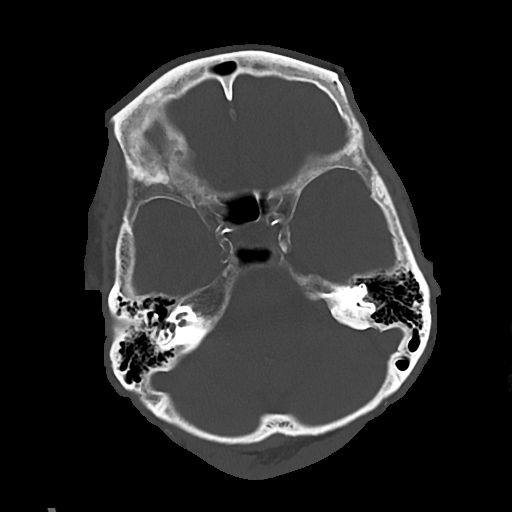
[im 23/77  bone]
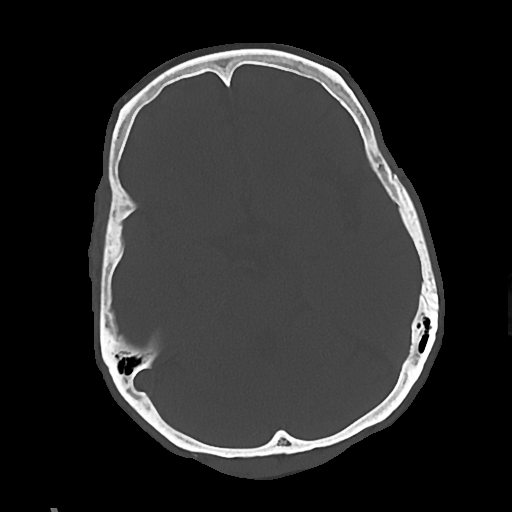

[Series 5: cor soft · coronal · 0.30mm/px · 3 of 67 slices shown]
[im 23/67  brain]
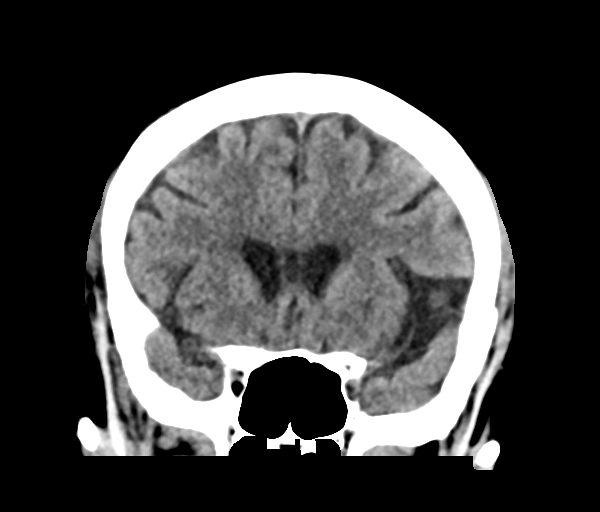
[im 30/67  brain]
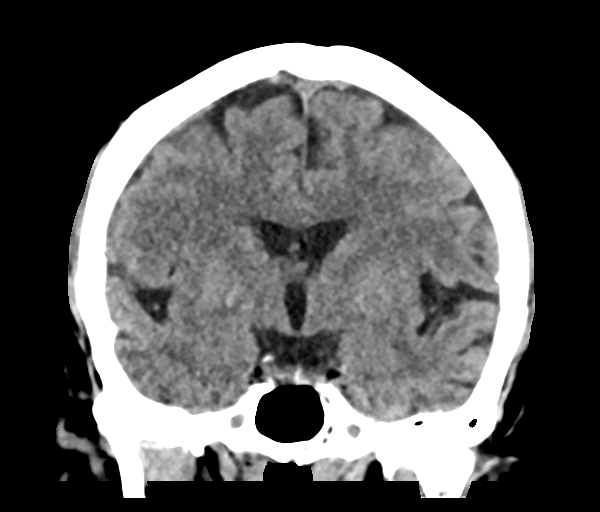
[im 37/67  brain]
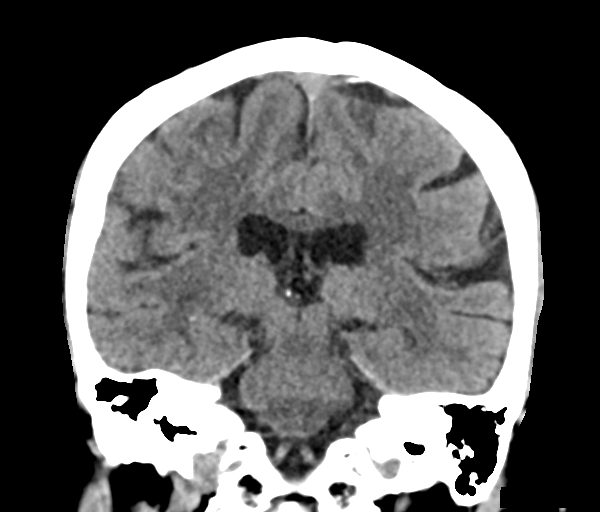

[Series 6: sag soft · sagittal · 0.30mm/px · 3 of 67 slices shown]
[im 23/67  brain]
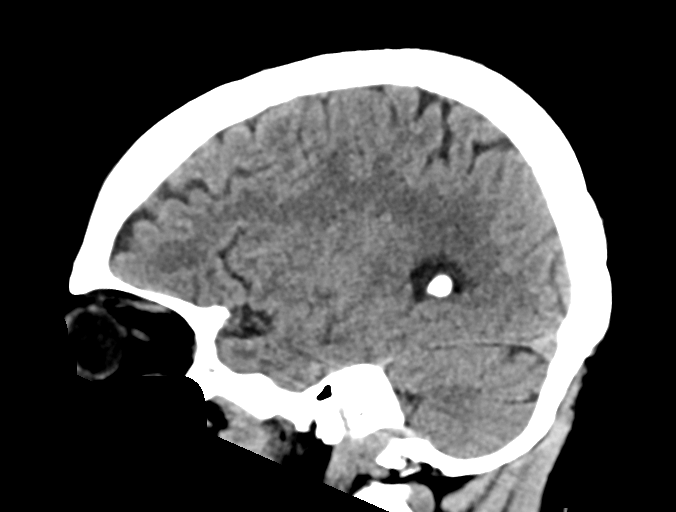
[im 34/67  brain]
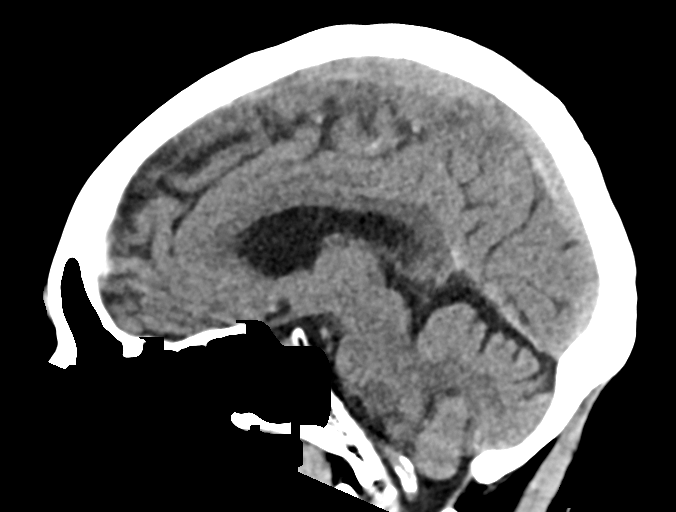
[im 45/67  brain]
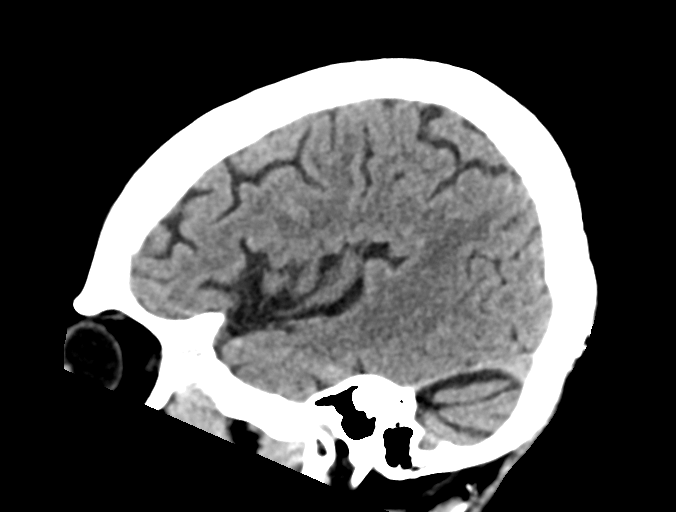

[16 of 47 positions shown; findings below may reference images not displayed]

FINDINGS: Brain: No acute territorial infarction, hemorrhage, or intracranial
mass. Mild atrophy. Mild small vessel ischemic changes of the white
matter. Stable ventricle size.

Vascular: No hyperdense vessels. Carotid vascular and vertebral
artery calcification

Skull: Normal. Negative for fracture or focal lesion.

Sinuses/Orbits: Small osteomas in the left ethmoid sinus.

Other: None
IMPRESSION: 1. No CT evidence for acute intracranial abnormality.
2. Atrophy and mild small vessel ischemic changes of the white
matter

## 2021-03-19 IMAGING — CR CHEST - 2 VIEW
2 series · 2 of 2 positions shown · non-contrast
Comparison: Chest x-rays dated 04/23/2010 and 04/18/2010

CLINICAL DATA: Chest pain since a fall 5 days ago, striking her
chest on a bathroom cabinet at that time.

EXAM:
CHEST - 2 VIEW

[chest pa]
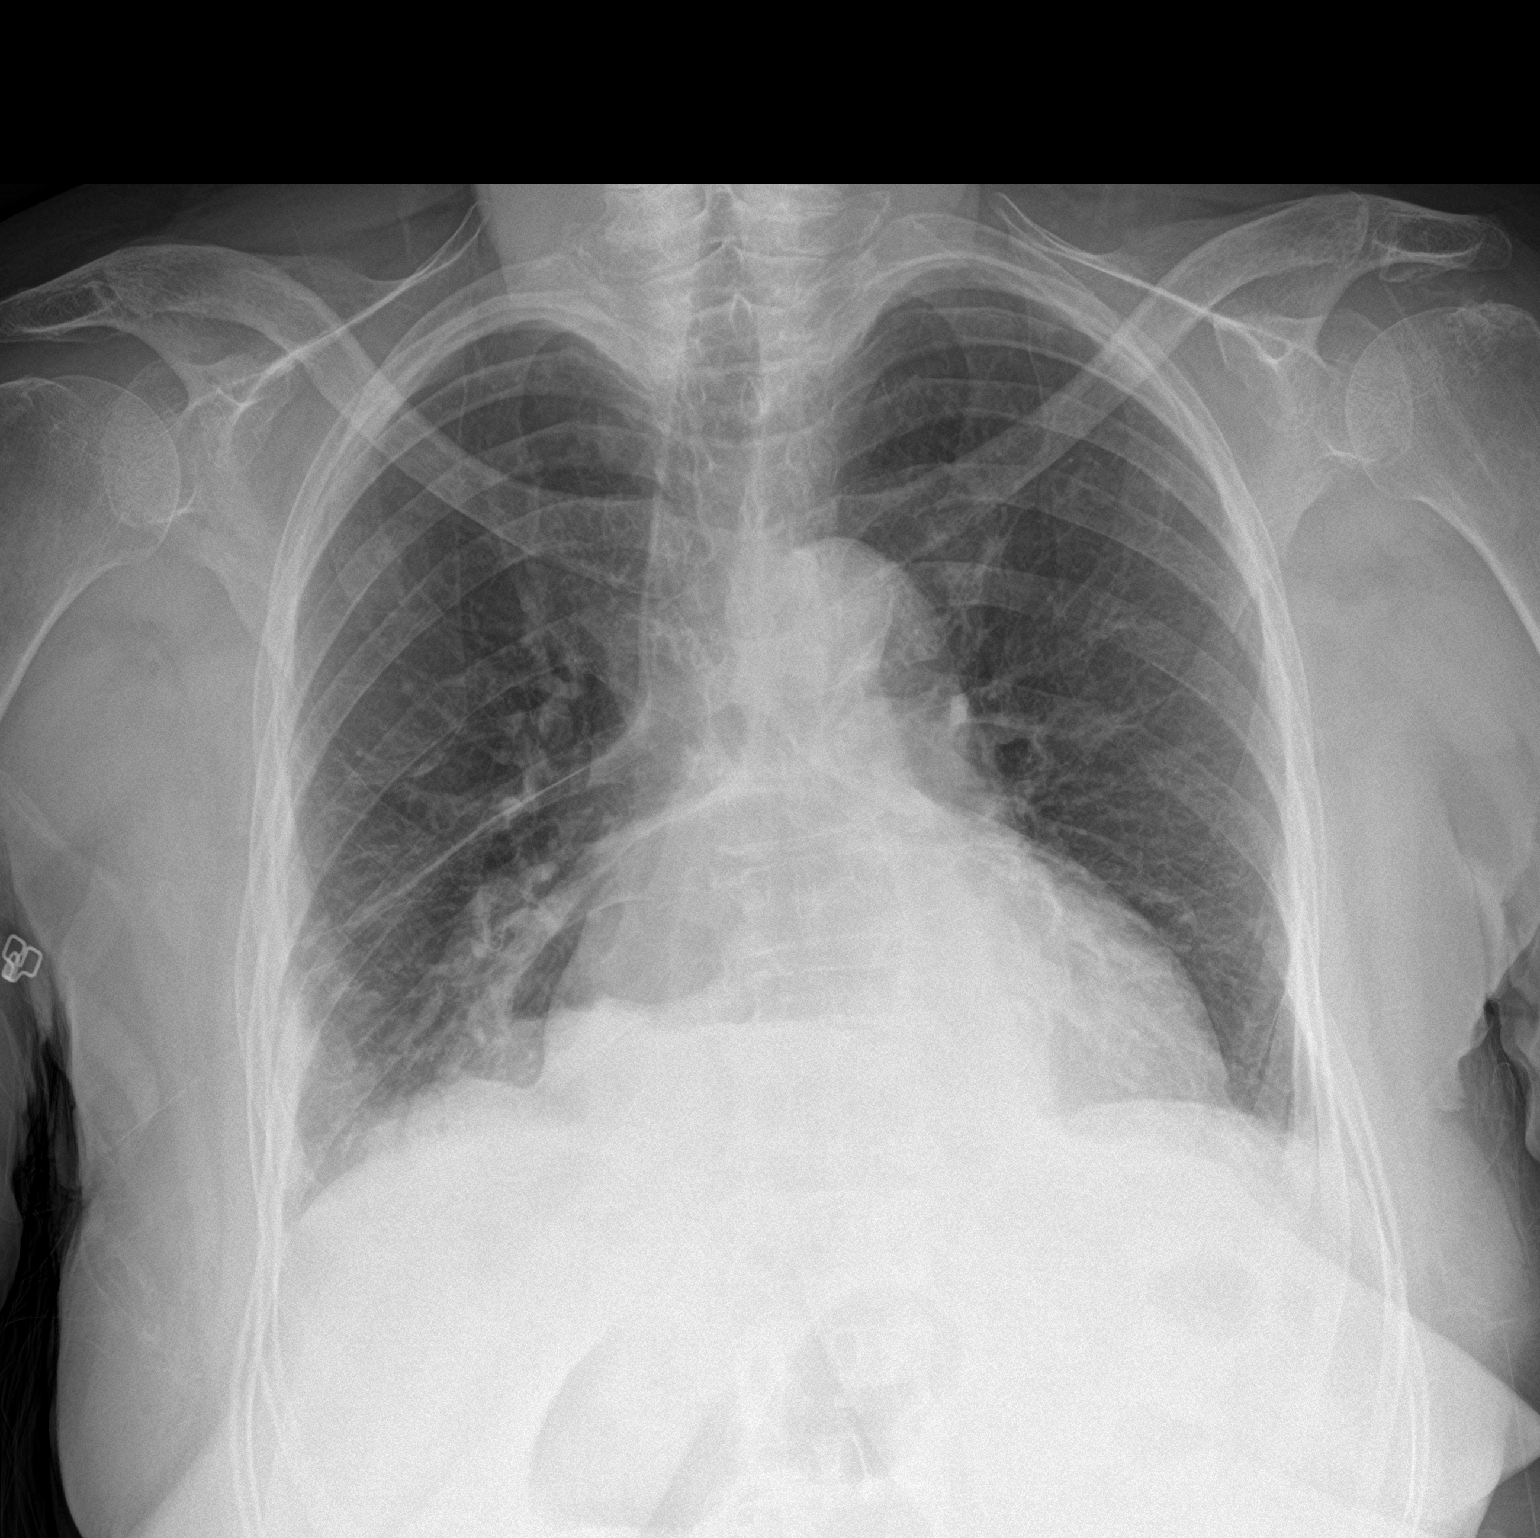

[chest lat]
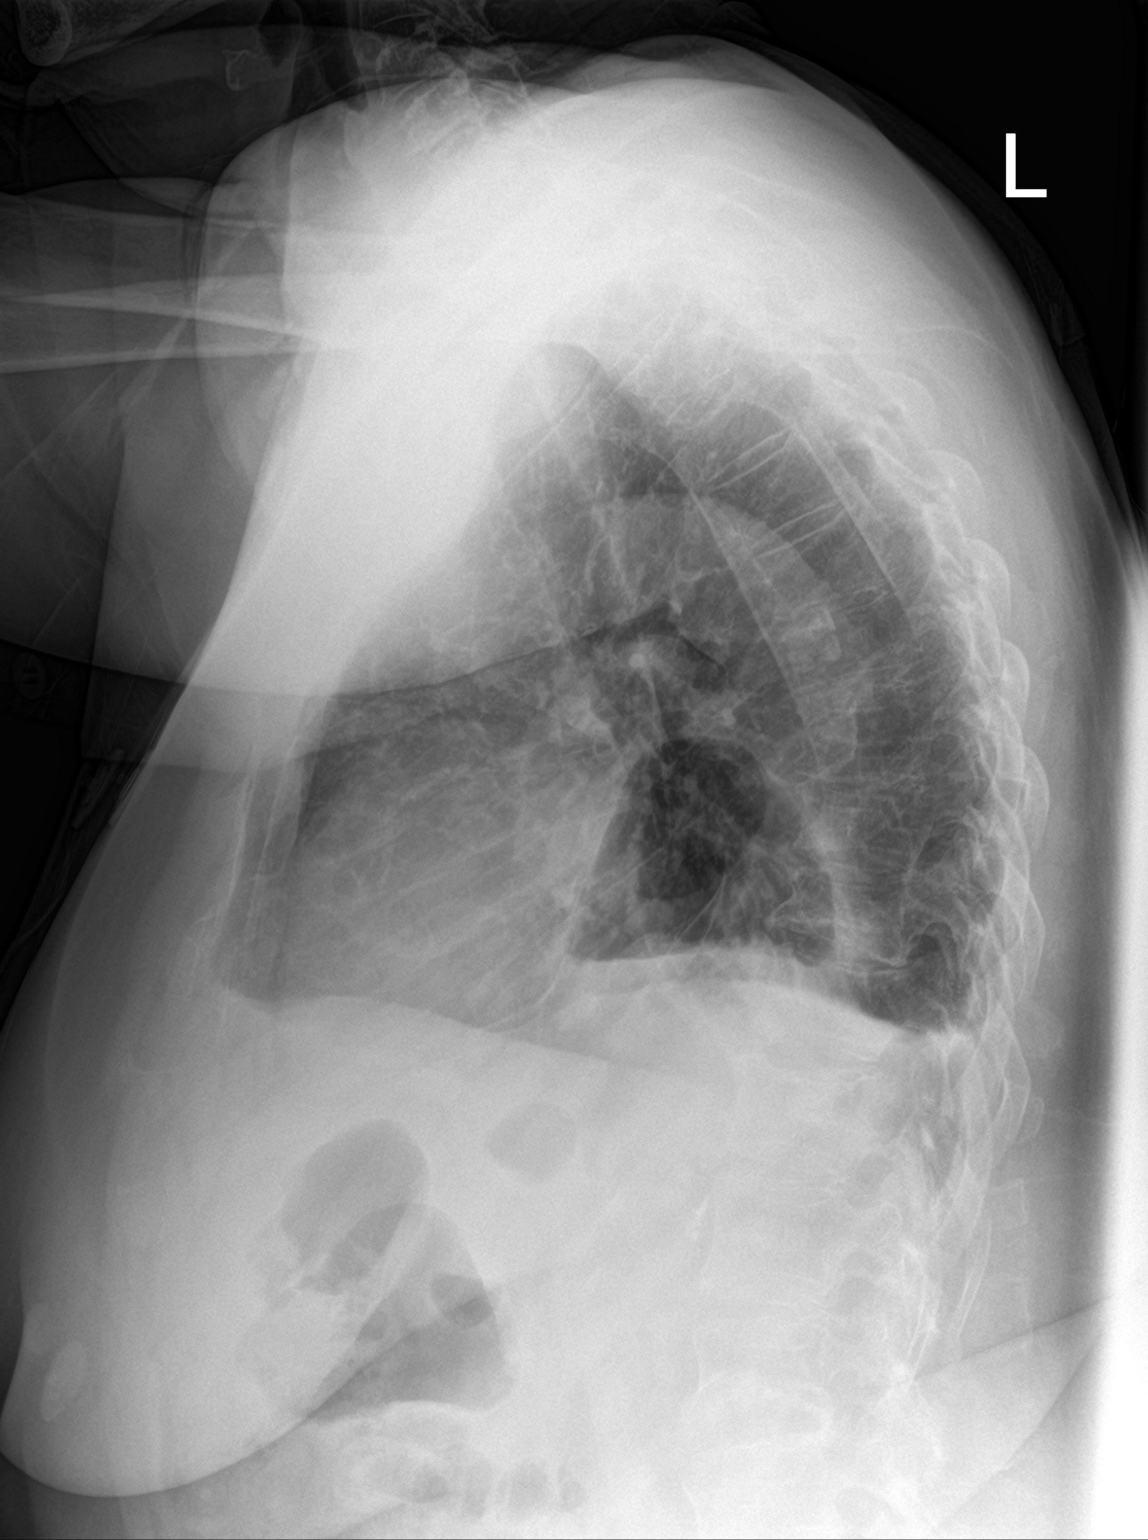

[2 of 2 positions shown; findings below may reference images not displayed]

FINDINGS: Heart size and pulmonary vascularity are normal. Tortuosity and
calcification of the thoracic aorta. Large hiatal hernia.

Slight atelectasis at the right base.

No discrete fractures.
IMPRESSION: 1. Slight atelectasis at the right base.
2. Large hiatal hernia.
3. Aortic atherosclerosis.

A

## 2021-03-21 ENCOUNTER — Other Ambulatory Visit: Payer: Self-pay | Admitting: Internal Medicine

## 2021-03-21 DIAGNOSIS — E1122 Type 2 diabetes mellitus with diabetic chronic kidney disease: Secondary | ICD-10-CM | POA: Diagnosis not present

## 2021-03-21 DIAGNOSIS — I10 Essential (primary) hypertension: Secondary | ICD-10-CM | POA: Diagnosis not present

## 2021-03-21 DIAGNOSIS — E113299 Type 2 diabetes mellitus with mild nonproliferative diabetic retinopathy without macular edema, unspecified eye: Secondary | ICD-10-CM | POA: Diagnosis not present

## 2021-03-21 DIAGNOSIS — R519 Headache, unspecified: Secondary | ICD-10-CM | POA: Diagnosis not present

## 2021-03-21 DIAGNOSIS — E7800 Pure hypercholesterolemia, unspecified: Secondary | ICD-10-CM | POA: Diagnosis not present

## 2021-03-21 DIAGNOSIS — E1140 Type 2 diabetes mellitus with diabetic neuropathy, unspecified: Secondary | ICD-10-CM | POA: Diagnosis not present

## 2021-03-21 DIAGNOSIS — Z0000 Encounter for general adult medical examination without abnormal findings: Secondary | ICD-10-CM | POA: Diagnosis not present

## 2021-03-21 DIAGNOSIS — R200 Anesthesia of skin: Secondary | ICD-10-CM | POA: Diagnosis not present

## 2021-03-21 DIAGNOSIS — M810 Age-related osteoporosis without current pathological fracture: Secondary | ICD-10-CM | POA: Diagnosis not present

## 2021-03-22 ENCOUNTER — Ambulatory Visit
Admission: RE | Admit: 2021-03-22 | Discharge: 2021-03-22 | Disposition: A | Discharge status: Home / Self Care | Payer: Medicare PPO | Source: Ambulatory Visit | Attending: Internal Medicine | Admitting: Internal Medicine

## 2021-03-22 DIAGNOSIS — R519 Headache, unspecified: Secondary | ICD-10-CM | POA: Diagnosis not present

## 2021-09-11 DIAGNOSIS — N183 Chronic kidney disease, stage 3 unspecified: Secondary | ICD-10-CM | POA: Diagnosis not present

## 2021-09-11 DIAGNOSIS — R809 Proteinuria, unspecified: Secondary | ICD-10-CM | POA: Diagnosis not present

## 2021-09-11 DIAGNOSIS — I129 Hypertensive chronic kidney disease with stage 1 through stage 4 chronic kidney disease, or unspecified chronic kidney disease: Secondary | ICD-10-CM | POA: Diagnosis not present

## 2021-09-11 DIAGNOSIS — N2581 Secondary hyperparathyroidism of renal origin: Secondary | ICD-10-CM | POA: Diagnosis not present

## 2021-09-11 DIAGNOSIS — N184 Chronic kidney disease, stage 4 (severe): Secondary | ICD-10-CM | POA: Diagnosis not present

## 2021-09-11 DIAGNOSIS — E1122 Type 2 diabetes mellitus with diabetic chronic kidney disease: Secondary | ICD-10-CM | POA: Diagnosis not present

## 2021-09-11 DIAGNOSIS — R609 Edema, unspecified: Secondary | ICD-10-CM | POA: Diagnosis not present

## 2021-09-11 DIAGNOSIS — D631 Anemia in chronic kidney disease: Secondary | ICD-10-CM | POA: Diagnosis not present

## 2021-09-21 DIAGNOSIS — E1140 Type 2 diabetes mellitus with diabetic neuropathy, unspecified: Secondary | ICD-10-CM | POA: Diagnosis not present

## 2021-09-21 DIAGNOSIS — E113299 Type 2 diabetes mellitus with mild nonproliferative diabetic retinopathy without macular edema, unspecified eye: Secondary | ICD-10-CM | POA: Diagnosis not present

## 2021-09-21 DIAGNOSIS — I10 Essential (primary) hypertension: Secondary | ICD-10-CM | POA: Diagnosis not present

## 2021-09-21 DIAGNOSIS — E7800 Pure hypercholesterolemia, unspecified: Secondary | ICD-10-CM | POA: Diagnosis not present

## 2021-09-21 DIAGNOSIS — Z23 Encounter for immunization: Secondary | ICD-10-CM | POA: Diagnosis not present

## 2021-09-21 DIAGNOSIS — E1122 Type 2 diabetes mellitus with diabetic chronic kidney disease: Secondary | ICD-10-CM | POA: Diagnosis not present

## 2021-09-21 DIAGNOSIS — F411 Generalized anxiety disorder: Secondary | ICD-10-CM | POA: Diagnosis not present

## 2021-09-21 DIAGNOSIS — M810 Age-related osteoporosis without current pathological fracture: Secondary | ICD-10-CM | POA: Diagnosis not present

## 2021-10-18 DIAGNOSIS — N183 Chronic kidney disease, stage 3 unspecified: Secondary | ICD-10-CM | POA: Diagnosis not present

## 2022-03-06 ENCOUNTER — Telehealth: Payer: Self-pay

## 2022-03-06 NOTE — Patient Outreach (Signed)
  Care Management   Outreach Note  03/06/2022 Name: VALBONA SLABACH MRN: [id number] DOB: 1941/03/03  An unsuccessful telephone outreach was attempted today. The patient was referred to the case management team for assistance with care management and care coordination.    Follow Up Plan:  A HIPAA compliant voice message was left today requesting a return call.   [facility] [phone]

## 2022-03-20 DIAGNOSIS — E11319 Type 2 diabetes mellitus with unspecified diabetic retinopathy without macular edema: Secondary | ICD-10-CM | POA: Diagnosis not present

## 2022-03-20 DIAGNOSIS — I959 Hypotension, unspecified: Secondary | ICD-10-CM | POA: Diagnosis not present

## 2022-03-20 DIAGNOSIS — E7800 Pure hypercholesterolemia, unspecified: Secondary | ICD-10-CM | POA: Diagnosis not present

## 2022-03-20 DIAGNOSIS — N1832 Chronic kidney disease, stage 3b: Secondary | ICD-10-CM | POA: Diagnosis not present

## 2022-03-20 DIAGNOSIS — I10 Essential (primary) hypertension: Secondary | ICD-10-CM | POA: Diagnosis not present

## 2022-03-20 DIAGNOSIS — F411 Generalized anxiety disorder: Secondary | ICD-10-CM | POA: Diagnosis not present

## 2022-03-20 DIAGNOSIS — E1140 Type 2 diabetes mellitus with diabetic neuropathy, unspecified: Secondary | ICD-10-CM | POA: Diagnosis not present

## 2022-03-20 DIAGNOSIS — R5383 Other fatigue: Secondary | ICD-10-CM | POA: Diagnosis not present

## 2022-03-20 DIAGNOSIS — R197 Diarrhea, unspecified: Secondary | ICD-10-CM | POA: Diagnosis not present

## 2022-03-21 DIAGNOSIS — R5383 Other fatigue: Secondary | ICD-10-CM | POA: Diagnosis not present

## 2022-03-22 ENCOUNTER — Encounter (HOSPITAL_COMMUNITY): Payer: Self-pay

## 2022-03-22 ENCOUNTER — Encounter (HOSPITAL_BASED_OUTPATIENT_CLINIC_OR_DEPARTMENT_OTHER): Payer: Self-pay

## 2022-03-22 ENCOUNTER — Other Ambulatory Visit: Payer: Self-pay

## 2022-03-22 ENCOUNTER — Emergency Department (HOSPITAL_BASED_OUTPATIENT_CLINIC_OR_DEPARTMENT_OTHER): Payer: Medicare PPO

## 2022-03-22 ENCOUNTER — Inpatient Hospital Stay (HOSPITAL_BASED_OUTPATIENT_CLINIC_OR_DEPARTMENT_OTHER)
Admission: EM | Admit: 2022-03-22 | Discharge: 2022-03-26 | DRG: 682 | Disposition: A | Discharge status: Home Health | Payer: Medicare PPO | Attending: Internal Medicine | Admitting: Internal Medicine

## 2022-03-22 DIAGNOSIS — Z820 Family history of epilepsy and other diseases of the nervous system: Secondary | ICD-10-CM

## 2022-03-22 DIAGNOSIS — E875 Hyperkalemia: Secondary | ICD-10-CM | POA: Diagnosis present

## 2022-03-22 DIAGNOSIS — E1159 Type 2 diabetes mellitus with other circulatory complications: Secondary | ICD-10-CM | POA: Diagnosis present

## 2022-03-22 DIAGNOSIS — I129 Hypertensive chronic kidney disease with stage 1 through stage 4 chronic kidney disease, or unspecified chronic kidney disease: Secondary | ICD-10-CM | POA: Diagnosis present

## 2022-03-22 DIAGNOSIS — N179 Acute kidney failure, unspecified: Secondary | ICD-10-CM | POA: Diagnosis present

## 2022-03-22 DIAGNOSIS — Z833 Family history of diabetes mellitus: Secondary | ICD-10-CM | POA: Diagnosis not present

## 2022-03-22 DIAGNOSIS — N184 Chronic kidney disease, stage 4 (severe): Secondary | ICD-10-CM | POA: Diagnosis present

## 2022-03-22 DIAGNOSIS — E785 Hyperlipidemia, unspecified: Secondary | ICD-10-CM | POA: Diagnosis present

## 2022-03-22 DIAGNOSIS — Z794 Long term (current) use of insulin: Secondary | ICD-10-CM

## 2022-03-22 DIAGNOSIS — F419 Anxiety disorder, unspecified: Secondary | ICD-10-CM | POA: Diagnosis present

## 2022-03-22 DIAGNOSIS — Z888 Allergy status to other drugs, medicaments and biological substances status: Secondary | ICD-10-CM

## 2022-03-22 DIAGNOSIS — E860 Dehydration: Secondary | ICD-10-CM | POA: Diagnosis present

## 2022-03-22 DIAGNOSIS — N3000 Acute cystitis without hematuria: Secondary | ICD-10-CM | POA: Diagnosis present

## 2022-03-22 DIAGNOSIS — U071 COVID-19: Secondary | ICD-10-CM

## 2022-03-22 DIAGNOSIS — I10 Essential (primary) hypertension: Secondary | ICD-10-CM | POA: Diagnosis present

## 2022-03-22 DIAGNOSIS — N2889 Other specified disorders of kidney and ureter: Secondary | ICD-10-CM | POA: Diagnosis not present

## 2022-03-22 DIAGNOSIS — E871 Hypo-osmolality and hyponatremia: Secondary | ICD-10-CM | POA: Diagnosis present

## 2022-03-22 DIAGNOSIS — Z885 Allergy status to narcotic agent status: Secondary | ICD-10-CM | POA: Diagnosis not present

## 2022-03-22 DIAGNOSIS — I69351 Hemiplegia and hemiparesis following cerebral infarction affecting right dominant side: Secondary | ICD-10-CM | POA: Diagnosis not present

## 2022-03-22 DIAGNOSIS — E1122 Type 2 diabetes mellitus with diabetic chronic kidney disease: Secondary | ICD-10-CM | POA: Diagnosis present

## 2022-03-22 DIAGNOSIS — Z8673 Personal history of transient ischemic attack (TIA), and cerebral infarction without residual deficits: Secondary | ICD-10-CM

## 2022-03-22 DIAGNOSIS — Z8249 Family history of ischemic heart disease and other diseases of the circulatory system: Secondary | ICD-10-CM

## 2022-03-22 DIAGNOSIS — H9190 Unspecified hearing loss, unspecified ear: Secondary | ICD-10-CM | POA: Diagnosis present

## 2022-03-22 DIAGNOSIS — N189 Chronic kidney disease, unspecified: Secondary | ICD-10-CM | POA: Diagnosis not present

## 2022-03-22 DIAGNOSIS — Z79899 Other long term (current) drug therapy: Secondary | ICD-10-CM | POA: Diagnosis not present

## 2022-03-22 DIAGNOSIS — N3289 Other specified disorders of bladder: Secondary | ICD-10-CM | POA: Diagnosis not present

## 2022-03-22 DIAGNOSIS — R531 Weakness: Secondary | ICD-10-CM | POA: Diagnosis not present

## 2022-03-22 LAB — URINALYSIS, ROUTINE W REFLEX MICROSCOPIC
Bilirubin Urine: NEGATIVE
Glucose, UA: NEGATIVE mg/dL
Ketones, ur: NEGATIVE mg/dL
Nitrite: NEGATIVE
Protein, ur: 30 mg/dL — AB
Specific Gravity, Urine: 1.014 (ref 1.005–1.030)
pH: 5.5 (ref 5.0–8.0)

## 2022-03-22 LAB — RENAL FUNCTION PANEL
Albumin: 3.4 g/dL — ABNORMAL LOW (ref 3.5–5.0)
Anion gap: 13 (ref 5–15)
BUN: 72 mg/dL — ABNORMAL HIGH (ref 8–23)
CO2: 17 mmol/L — ABNORMAL LOW (ref 22–32)
Calcium: 9.1 mg/dL (ref 8.9–10.3)
Chloride: 104 mmol/L (ref 98–111)
Creatinine, Ser: 3.24 mg/dL — ABNORMAL HIGH (ref 0.44–1.00)
GFR, Estimated: 14 mL/min — ABNORMAL LOW (ref >60)
Glucose, Bld: 156 mg/dL — ABNORMAL HIGH (ref 70–99)
Phosphorus: 3 mg/dL (ref 2.5–4.6)
Potassium: 4.2 mmol/L (ref 3.5–5.1)
Sodium: 134 mmol/L — ABNORMAL LOW (ref 135–145)

## 2022-03-22 LAB — CBC WITH DIFFERENTIAL/PLATELET
Abs Immature Granulocytes: 0.09 10*3/uL — ABNORMAL HIGH (ref 0.00–0.07)
Basophils Absolute: 0 10*3/uL (ref 0.0–0.1)
Basophils Relative: 0 %
Eosinophils Absolute: 0.1 10*3/uL (ref 0.0–0.5)
Eosinophils Relative: 1 %
HCT: 34.6 % — ABNORMAL LOW (ref 36.0–46.0)
Hemoglobin: 11.3 g/dL — ABNORMAL LOW (ref 12.0–15.0)
Immature Granulocytes: 1 %
Lymphocytes Relative: 12 %
Lymphs Abs: 1.5 10*3/uL (ref 0.7–4.0)
MCH: 32.9 pg (ref 26.0–34.0)
MCHC: 32.7 g/dL (ref 30.0–36.0)
MCV: 100.9 fL — ABNORMAL HIGH (ref 80.0–100.0)
Monocytes Absolute: 1 10*3/uL (ref 0.1–1.0)
Monocytes Relative: 8 %
Neutro Abs: 9.6 10*3/uL — ABNORMAL HIGH (ref 1.7–7.7)
Neutrophils Relative %: 78 %
Platelets: 519 10*3/uL — ABNORMAL HIGH (ref 150–400)
RBC: 3.43 MIL/uL — ABNORMAL LOW (ref 3.87–5.11)
RDW: 14.3 % (ref 11.5–15.5)
WBC: 12.2 10*3/uL — ABNORMAL HIGH (ref 4.0–10.5)
nRBC: 0 % (ref 0.0–0.2)

## 2022-03-22 LAB — SARS CORONAVIRUS 2 BY RT PCR: SARS Coronavirus 2 by RT PCR: POSITIVE — AB

## 2022-03-22 MED ORDER — SODIUM CHLORIDE 0.9 % IV SOLN
1.0000 g | Freq: Once | INTRAVENOUS | Status: AC
  Administered 2022-03-22: 1 g via INTRAVENOUS
  Filled 2022-03-22: qty 10

## 2022-03-22 MED ORDER — SODIUM CHLORIDE 0.9 % IV SOLN: INTRAVENOUS | Status: AC

## 2022-03-22 MED ORDER — SODIUM CHLORIDE 0.9 % IV BOLUS
1000.0000 mL | Freq: Once | INTRAVENOUS | Status: AC
  Administered 2022-03-22: 1000 mL via INTRAVENOUS

## 2022-03-22 MED ORDER — SODIUM CHLORIDE 0.9 % IV SOLN: Freq: Once | INTRAVENOUS | Status: DC

## 2022-03-22 NOTE — ED Notes (Signed)
Patient endorses generalized weakness, cough x 2 weeks, and was sent here for AKI per PCP.

## 2022-03-22 NOTE — ED Provider Notes (Signed)
[facility] Provider Note   CSN: [id number] Arrival date & time: 03/22/22  1919     History  Chief Complaint  Patient presents with   Abnormal Lab    Donna Green is a 81 y.o. female.  Patient here with abnormal lab.  Had elevated kidney function after seeing primary care doctor.  Seems like they might of checked her kidney function twice this week.  They do not have the labs with them.  They can try to find them online possibly.  Patient has chronic kidney disease.  She has been having some fatigue, diarrhea may be cold/viral type symptoms.  Some abdominal pain at times.  She has had decreased p.o. intake.  Denies any chest pain, shortness of breath, leg swelling.  The history is provided by the patient.       Home Medications Prior to Admission medications   Medication Sig Start Date End Date Taking? Authorizing Provider  acetaminophen (TYLENOL) 325 MG tablet Take 650 mg by mouth every 6 (six) hours as needed for moderate pain.    [provider]  amLODipine (NORVASC) 10 MG tablet Take 10 mg by mouth daily.    [provider]  CRESTOR 20 MG tablet Take 20 mg by mouth every evening.  12/02/14   [provider]  diclofenac sodium (VOLTAREN) 1 % GEL Apply 4 g topically 4 (four) times daily. 03/18/19   Deno Etienne, DO  dipyridamole-aspirin (AGGRENOX) 200-25 MG per 12 hr capsule Take 1 capsule by mouth 2 (two) times daily.    [provider]  ibandronate (BONIVA) 150 MG tablet Take 150 mg by mouth every 30 (thirty) days. Take in the morning with a full glass of water, on an empty stomach, and do not take anything else by mouth or lie down for the next 30 min.    [provider]  insulin aspart (NOVOLOG FLEXPEN) 100 UNIT/ML FlexPen Inject 4-8 Units into the skin 3 (three) times daily with meals. Sliding scale    [provider]  insulin glargine (LANTUS) 100 UNIT/ML injection Inject 34 Units into the skin  at bedtime.     [provider]  LANTUS SOLOSTAR 100 UNIT/ML Solostar Pen  12/20/14   [provider]  LORazepam (ATIVAN) 1 MG tablet Take 1 mg by mouth 2 (two) times daily.    [provider]  metoprolol (LOPRESSOR) 100 MG tablet Take 100 mg by mouth 2 (two) times daily.    [provider]  pregabalin (LYRICA) 50 MG capsule Take 50 mg by mouth 2 (two) times daily.    [provider]  valsartan-hydrochlorothiazide (DIOVAN-HCT) 320-25 MG per tablet Take 1 tablet by mouth daily.    [provider]  ZOSTAVAX 27253 UNT/0.65ML injection Inject 1 each into the muscle once. 12/02/14   [provider]      Allergies    Codeine, Other, Allopurinol, and Colcrys [colchicine]    Review of Systems   Review of Systems  Physical Exam Updated Vital Signs BP (!) 147/76   Pulse (!) 107   Temp 98.7 F (37.1 C) (Oral)   Resp 14   Ht 5' 5.5" (1.664 m)   Wt 82.7 kg   SpO2 100%   BMI 29.88 kg/m  Physical Exam Vitals and nursing note reviewed.  Constitutional:      General: She is not in acute distress.    Appearance: She is well-developed.  HENT:     Head: Normocephalic and atraumatic.  Nose: Nose normal.     Mouth/Throat:     Mouth: Mucous membranes are moist.  Eyes:     Extraocular Movements: Extraocular movements intact.     Conjunctiva/sclera: Conjunctivae normal.     Pupils: Pupils are equal, round, and reactive to light.  Cardiovascular:     Rate and Rhythm: Normal rate and regular rhythm.     Pulses: Normal pulses.     Heart sounds: Normal heart sounds. No murmur heard. Pulmonary:     Effort: Pulmonary effort is normal. No respiratory distress.     Breath sounds: Normal breath sounds.  Abdominal:     Palpations: Abdomen is soft.     Tenderness: There is no abdominal tenderness.  Musculoskeletal:        General: No swelling.     Cervical back: Neck supple.     Right lower leg: No edema.     Left lower leg: No edema.   Skin:    General: Skin is warm and dry.     Capillary Refill: Capillary refill takes less than 2 seconds.  Neurological:     Mental Status: She is alert.  Psychiatric:        Mood and Affect: Mood normal.     ED Results / Procedures / Treatments   Labs (all labs ordered are listed, but only abnormal results are displayed) Labs Reviewed  SARS CORONAVIRUS 2 BY RT PCR - Abnormal; Notable for the following components:      Result Value   SARS Coronavirus 2 by RT PCR POSITIVE (*)    All other components within normal limits  CBC WITH DIFFERENTIAL/PLATELET - Abnormal; Notable for the following components:   WBC 12.2 (*)    RBC 3.43 (*)    Hemoglobin 11.3 (*)    HCT 34.6 (*)    MCV 100.9 (*)    Platelets 519 (*)    Neutro Abs 9.6 (*)    Abs Immature Granulocytes 0.09 (*)    All other components within normal limits  RENAL FUNCTION PANEL - Abnormal; Notable for the following components:   Sodium 134 (*)    CO2 17 (*)    Glucose, Bld 156 (*)    BUN 72 (*)    Creatinine, Ser 3.24 (*)    Albumin 3.4 (*)    GFR, Estimated 14 (*)    All other components within normal limits  URINE CULTURE  URINALYSIS, ROUTINE W REFLEX MICROSCOPIC    EKG EKG Interpretation  Date/Time:  Thursday March 22 2022 20:27:31 EDT Ventricular Rate:  105 PR Interval:  136 QRS Duration: 79 QT Interval:  352 QTC Calculation: 466 R Axis:   33 Text Interpretation: Sinus tachycardia Confirmed by Lennice Sites ([id number]) on 03/22/2022 8:37:51 PM  Radiology DG Chest Portable 1 View  Result Date: 03/22/2022 CLINICAL DATA:  Weakness EXAM: PORTABLE CHEST 1 VIEW COMPARISON:  03/18/2019 FINDINGS: Heart is normal size. Large hiatal hernia. No confluent airspace opacities or effusions. No acute bony abnormality. Aortic atherosclerosis. IMPRESSION: Large hiatal hernia. No active disease. Electronically Signed   By: Rolm Baptise M.D.   On: 03/22/2022 20:12   CT Renal Stone Study  Result Date: 03/22/2022 CLINICAL  DATA:  Flank pain, kidney stone suspected EXAM: CT ABDOMEN AND PELVIS WITHOUT CONTRAST TECHNIQUE: Multidetector CT imaging of the abdomen and pelvis was performed following the standard protocol without IV contrast. RADIATION DOSE REDUCTION: This exam was performed according to the departmental dose-optimization program which includes automated exposure control, adjustment of the  mA and/or kV according to patient size and/or use of iterative reconstruction technique. COMPARISON:  None Available. FINDINGS: Lower chest: Large hiatal hernia. Heart is normal size. No acute abnormality. Hepatobiliary: Gallbladder is mildly distended. No visible stones or biliary ductal dilatation. No focal hepatic abnormality. Pancreas: No focal abnormality or ductal dilatation. Spleen: No focal abnormality.  Normal size. Adrenals/Urinary Tract: 2 cm low-density lesion in the midpole of the left kidney. 1.5 cm low-density lesion in the lower pole of the left kidney. These are difficult to characterize on this noncontrast study. 3 cm low-density lesion off the lower pole of the right kidney which appears to be cystic. Other smaller low-density areas in the right kidney. No stones or hydronephrosis. Adrenal glands and urinary bladder unremarkable. Stomach/Bowel: Sigmoid diverticulosis. No active diverticulitis. Stomach and small bowel decompressed, unremarkable. Normal appendix Vascular/Lymphatic: No evidence of aneurysm or adenopathy. Aortoiliac atherosclerosis. Reproductive: Prior hysterectomy.  No adnexal masses. Other: No free fluid or free air. Musculoskeletal: Mild compression fracture through the inferior endplate of L2. Advanced degenerative changes throughout the lumbar spine most pronounced at the lumbosacral junction. Grade 1 anterolisthesis of L5 on S1. IMPRESSION: Mildly distended gallbladder. No visible stones. If there is clinical concern for cholelithiasis or cholecystitis, recommend further evaluation with right upper  quadrant ultrasound. Low-density lesions within both kidneys, some which are difficult to characterize on this noncontrast study. These could be further characterized with elective renal ultrasound if felt clinically indicated. Large hiatal hernia. Aortic atherosclerosis. Sigmoid diverticulosis. Electronically Signed   By: Rolm Baptise M.D.   On: 03/22/2022 20:11    Procedures Procedures    Medications Ordered in ED Medications  0.9 %  sodium chloride infusion ( Intravenous New Bag/Given 03/22/22 2130)  sodium chloride 0.9 % bolus 1,000 mL ( Intravenous Stopped 03/22/22 2004)  cefTRIAXone (ROCEPHIN) 1 g in sodium chloride 0.9 % 100 mL IVPB (0 g Intravenous Stopped 03/22/22 2039)    ED Course/ Medical Decision Making/ A&P                           Medical Decision Making Amount and/or Complexity of Data Reviewed Labs: ordered. Radiology: ordered.  Risk Prescription drug management. Decision regarding hospitalization.   Donna Green is here with abnormal kidney function.  Sent by primary care doctor.  Normal vitals.  No fever.  She has had diarrhea, decreased p.o. intake, viral type symptoms here recently.  Abdominal pain at times.  May be UTI symptoms.  She has a history of diabetes, chronic kidney disease, hypertension.  Supposedly her kidney function has been increasing here over the last week.  Sounds like she had a kidney function test twice this week with worsening labs today.  I am unable to see these labs on our medical system.  Her last creatinine was 3 years ago which is 1.9.  I was able to log into her [facility] medical chart with her family member.  It appears that her creatinine was 3.69 when blood work was drawn earlier this week.  She was found to have a urinary tract infection and started on antibiotics today.  When I look back at old labs her creatinine was 3.5 and 2022.  But there are no creatinines in between this.  I called [place] [facility] team, Dr. Posey Pronto and asked him to look  at their files for any more recent creatinines.  It looks like her baseline this past year has been 1.5-1.7.  So patient does  have AKI.  I suspect that this is from her recent illness.  She has been sick with a cold since 28 July.  Her COVID test is positive here.  She has had a lot of GI symptoms which I suspect is the cause of her AKI.  Talked with Dr. Posey Pronto and he recommends hydration and trending of the labs.  Hold any renal medications.  I will give her dose IV Rocephin.  She is not febrile.  I do not have any concern for sepsis.  Fluid bolus has been initiated.  CT scan also ordered as well as chest x-ray.  Per my review and interpretation of labs there is no significant anemia.  White count of 12.2.  Chest x-ray shows no evidence of pneumonia or pneumothorax.  CT renal study overall unremarkable.  Mildly distended gallbladder but she does not have any discomfort.  Awaiting renal function panel.  Will admit for hydration and further care.  She is outside the window for any COVID treatment.  Creatinine is 3.24.  Will admit to medicine for further care.  This chart was dictated using voice recognition software.  Despite best efforts to proofread,  errors can occur which can change the documentation meaning.         Final Clinical Impression(s) / ED Diagnoses Final diagnoses:  Acute cystitis without hematuria  AKI (acute kidney injury) ([place])  COVID-19    Rx / DC Orders ED Discharge Orders     None         Lennice Sites, DO 03/22/22 2132

## 2022-03-22 NOTE — ED Triage Notes (Signed)
Patient here POV from Home.  Endorses being at PCP 2 Days ago and Today to have Blood Specimens collected and being told she had AKI. Instructed to Seek ED Evaluation.   No Discernable Symptoms besides UTI Symptoms and Fatigue.   NAD Noted during Triage. A&Ox4. Gcs 15. Ambulatory with Cane.

## 2022-03-22 NOTE — ED Notes (Signed)
Pt taken to bathroom at this time via wheelchair

## 2022-03-22 NOTE — Plan of Care (Signed)
Originating Facility: [facility] Requesting Physician/APP: Dr. Ronnald Nian, MD  History: Donna Green is an 40 female who presented to [place] by discretion of her primary care physician with acute renal failure.  Baseline creatinine 1.5-1.7 per EDP after discussion with nephrology, Dr. Posey Pronto; as patient follows with [facility].  Also COVID-positive.  Concern for UTI, urinalysis and urine culture ordered.  Patient started on ceftriaxone and IV fluid hydration.  Plan of Care:  Admit to MedSurg bed  [facility] will assume care on arrival to accepting facility. Until arrival, care as per EDP. However, [facility] available 24/7 for questions and assistance.   Please page [name] and Consults [phone]) as soon as the patient arrives to the hospital.   Mikiala Fugett [place] DO

## 2022-03-23 ENCOUNTER — Encounter (HOSPITAL_COMMUNITY): Payer: Self-pay | Admitting: Internal Medicine

## 2022-03-23 DIAGNOSIS — U071 COVID-19: Secondary | ICD-10-CM | POA: Diagnosis present

## 2022-03-23 DIAGNOSIS — H9190 Unspecified hearing loss, unspecified ear: Secondary | ICD-10-CM | POA: Diagnosis present

## 2022-03-23 DIAGNOSIS — E1159 Type 2 diabetes mellitus with other circulatory complications: Secondary | ICD-10-CM | POA: Diagnosis present

## 2022-03-23 DIAGNOSIS — E860 Dehydration: Secondary | ICD-10-CM | POA: Diagnosis present

## 2022-03-23 DIAGNOSIS — Z794 Long term (current) use of insulin: Secondary | ICD-10-CM | POA: Diagnosis not present

## 2022-03-23 DIAGNOSIS — Z8249 Family history of ischemic heart disease and other diseases of the circulatory system: Secondary | ICD-10-CM | POA: Diagnosis not present

## 2022-03-23 DIAGNOSIS — Z79899 Other long term (current) drug therapy: Secondary | ICD-10-CM | POA: Diagnosis not present

## 2022-03-23 DIAGNOSIS — N3000 Acute cystitis without hematuria: Secondary | ICD-10-CM | POA: Diagnosis present

## 2022-03-23 DIAGNOSIS — N189 Chronic kidney disease, unspecified: Secondary | ICD-10-CM | POA: Diagnosis not present

## 2022-03-23 DIAGNOSIS — F419 Anxiety disorder, unspecified: Secondary | ICD-10-CM | POA: Diagnosis present

## 2022-03-23 DIAGNOSIS — E785 Hyperlipidemia, unspecified: Secondary | ICD-10-CM | POA: Diagnosis present

## 2022-03-23 DIAGNOSIS — I69351 Hemiplegia and hemiparesis following cerebral infarction affecting right dominant side: Secondary | ICD-10-CM | POA: Diagnosis not present

## 2022-03-23 DIAGNOSIS — Z888 Allergy status to other drugs, medicaments and biological substances status: Secondary | ICD-10-CM | POA: Diagnosis not present

## 2022-03-23 DIAGNOSIS — I129 Hypertensive chronic kidney disease with stage 1 through stage 4 chronic kidney disease, or unspecified chronic kidney disease: Secondary | ICD-10-CM | POA: Diagnosis present

## 2022-03-23 DIAGNOSIS — Z820 Family history of epilepsy and other diseases of the nervous system: Secondary | ICD-10-CM | POA: Diagnosis not present

## 2022-03-23 DIAGNOSIS — N179 Acute kidney failure, unspecified: Secondary | ICD-10-CM | POA: Diagnosis present

## 2022-03-23 DIAGNOSIS — Z833 Family history of diabetes mellitus: Secondary | ICD-10-CM | POA: Diagnosis not present

## 2022-03-23 DIAGNOSIS — E871 Hypo-osmolality and hyponatremia: Secondary | ICD-10-CM | POA: Diagnosis present

## 2022-03-23 DIAGNOSIS — E1122 Type 2 diabetes mellitus with diabetic chronic kidney disease: Secondary | ICD-10-CM | POA: Diagnosis present

## 2022-03-23 DIAGNOSIS — E875 Hyperkalemia: Secondary | ICD-10-CM | POA: Diagnosis present

## 2022-03-23 DIAGNOSIS — N184 Chronic kidney disease, stage 4 (severe): Secondary | ICD-10-CM | POA: Diagnosis present

## 2022-03-23 DIAGNOSIS — Z885 Allergy status to narcotic agent status: Secondary | ICD-10-CM | POA: Diagnosis not present

## 2022-03-23 HISTORY — DX: Anxiety disorder, unspecified: F41.9

## 2022-03-23 LAB — HEMOGLOBIN A1C
Hgb A1c MFr Bld: 9.2 % — ABNORMAL HIGH (ref 4.8–5.6)
Mean Plasma Glucose: 217.34 mg/dL

## 2022-03-23 LAB — PROCALCITONIN: Procalcitonin: 7 ng/mL

## 2022-03-23 LAB — CBG MONITORING, ED: Glucose-Capillary: 191 mg/dL — ABNORMAL HIGH (ref 70–99)

## 2022-03-23 LAB — LACTATE DEHYDROGENASE: LDH: 225 U/L — ABNORMAL HIGH (ref 98–192)

## 2022-03-23 LAB — D-DIMER, QUANTITATIVE: D-Dimer, Quant: 4.87 ug/mL-FEU — ABNORMAL HIGH (ref 0.00–0.50)

## 2022-03-23 LAB — FERRITIN: Ferritin: 230 ng/mL (ref 11–307)

## 2022-03-23 LAB — FIBRINOGEN: Fibrinogen: 715 mg/dL — ABNORMAL HIGH (ref 210–475)

## 2022-03-23 LAB — GLUCOSE, CAPILLARY
Glucose-Capillary: 308 mg/dL — ABNORMAL HIGH (ref 70–99)
Glucose-Capillary: 297 mg/dL — ABNORMAL HIGH (ref 70–99)

## 2022-03-23 LAB — NA AND K (SODIUM & POTASSIUM), RAND UR
Potassium Urine: 22 mmol/L
Sodium, Ur: 52 mmol/L

## 2022-03-23 LAB — C-REACTIVE PROTEIN: CRP: 15.1 mg/dL — ABNORMAL HIGH (ref <1.0)

## 2022-03-23 LAB — CREATININE, URINE, RANDOM: Creatinine, Urine: 70 mg/dL

## 2022-03-23 MED ORDER — ACETAMINOPHEN 325 MG PO TABS: 650.0000 mg | ORAL_TABLET | Freq: Four times a day (QID) | ORAL | Status: DC | PRN

## 2022-03-23 MED ORDER — SODIUM CHLORIDE 0.9% FLUSH
3.0000 mL | INTRAVENOUS | Status: DC | PRN
Start: 2022-03-23 — End: 2022-03-26

## 2022-03-23 MED ORDER — ADULT MULTIVITAMIN W/MINERALS CH
1.0000 | ORAL_TABLET | Freq: Every day | ORAL | Status: DC
  Administered 2022-03-24 – 2022-03-26 (×3): 1 via ORAL
  Filled 2022-03-23 (×3): qty 1

## 2022-03-23 MED ORDER — INSULIN ASPART 100 UNIT/ML FLEXPEN: 4.0000 [IU] | PEN_INJECTOR | Freq: Three times a day (TID) | SUBCUTANEOUS | Status: DC

## 2022-03-23 MED ORDER — OXYCODONE HCL 5 MG PO TABS: 5.0000 mg | ORAL_TABLET | ORAL | Status: DC | PRN

## 2022-03-23 MED ORDER — ONDANSETRON HCL 4 MG PO TABS: 4.0000 mg | ORAL_TABLET | Freq: Four times a day (QID) | ORAL | Status: DC | PRN

## 2022-03-23 MED ORDER — IRBESARTAN 300 MG PO TABS: 300.0000 mg | ORAL_TABLET | Freq: Every day | ORAL | Status: DC

## 2022-03-23 MED ORDER — ASPIRIN-DIPYRIDAMOLE ER 25-200 MG PO CP12
1.0000 | ORAL_CAPSULE | Freq: Two times a day (BID) | ORAL | Status: DC
  Administered 2022-03-23 – 2022-03-26 (×7): 1 via ORAL
  Filled 2022-03-23 (×8): qty 1

## 2022-03-23 MED ORDER — ENOXAPARIN SODIUM 30 MG/0.3ML IJ SOSY
30.0000 mg | PREFILLED_SYRINGE | INTRAMUSCULAR | Status: DC
  Administered 2022-03-23 – 2022-03-26 (×4): 30 mg via SUBCUTANEOUS
  Filled 2022-03-23 (×4): qty 0.3

## 2022-03-23 MED ORDER — ONDANSETRON HCL 4 MG/2ML IJ SOLN: 4.0000 mg | Freq: Four times a day (QID) | INTRAMUSCULAR | Status: DC | PRN

## 2022-03-23 MED ORDER — ZINC SULFATE 220 (50 ZN) MG PO CAPS
220.0000 mg | ORAL_CAPSULE | Freq: Every day | ORAL | Status: DC
  Administered 2022-03-23: 220 mg via ORAL
  Filled 2022-03-23: qty 1

## 2022-03-23 MED ORDER — SODIUM CHLORIDE 0.9 % IV SOLN: INTRAVENOUS | Status: DC | PRN

## 2022-03-23 MED ORDER — AMLODIPINE BESYLATE 10 MG PO TABS
10.0000 mg | ORAL_TABLET | Freq: Every day | ORAL | Status: DC
  Administered 2022-03-23 – 2022-03-26 (×4): 10 mg via ORAL
  Filled 2022-03-23 (×4): qty 1

## 2022-03-23 MED ORDER — ALBUTEROL SULFATE HFA 108 (90 BASE) MCG/ACT IN AERS: 2.0000 | INHALATION_SPRAY | RESPIRATORY_TRACT | Status: DC | PRN

## 2022-03-23 MED ORDER — ROSUVASTATIN CALCIUM 20 MG PO TABS
20.0000 mg | ORAL_TABLET | Freq: Every evening | ORAL | Status: DC
Start: 2022-03-23 — End: 2022-03-23

## 2022-03-23 MED ORDER — SODIUM CHLORIDE 0.9 % IV SOLN: INTRAVENOUS | Status: AC

## 2022-03-23 MED ORDER — SODIUM CHLORIDE 0.9% FLUSH
3.0000 mL | Freq: Two times a day (BID) | INTRAVENOUS | Status: DC
  Administered 2022-03-25: 3 mL via INTRAVENOUS

## 2022-03-23 MED ORDER — DEXTROMETHORPHAN POLISTIREX ER 30 MG/5ML PO SUER
30.0000 mg | Freq: Every day | ORAL | Status: DC | PRN
Start: 2022-03-23 — End: 2022-03-26

## 2022-03-23 MED ORDER — ASCORBIC ACID 500 MG PO TABS
500.0000 mg | ORAL_TABLET | Freq: Every day | ORAL | Status: DC
  Administered 2022-03-23 – 2022-03-26 (×4): 500 mg via ORAL
  Filled 2022-03-23 (×4): qty 1

## 2022-03-23 MED ORDER — INSULIN GLARGINE-YFGN 100 UNIT/ML ~~LOC~~ SOLN
34.0000 [IU] | Freq: Every day | SUBCUTANEOUS | Status: DC
  Administered 2022-03-23 – 2022-03-25 (×3): 34 [IU] via SUBCUTANEOUS
  Filled 2022-03-23 (×4): qty 0.34

## 2022-03-23 MED ORDER — METOPROLOL TARTRATE 25 MG PO TABS: 100.0000 mg | ORAL_TABLET | Freq: Two times a day (BID) | ORAL | Status: DC

## 2022-03-23 MED ORDER — HYDROCHLOROTHIAZIDE 25 MG PO TABS: 25.0000 mg | ORAL_TABLET | Freq: Every day | ORAL | Status: DC

## 2022-03-23 MED ORDER — FLEET ENEMA 7-19 GM/118ML RE ENEM
1.0000 | ENEMA | Freq: Once | RECTAL | Status: DC | PRN
Start: 2022-03-23 — End: 2022-03-26

## 2022-03-23 MED ORDER — LORAZEPAM 1 MG PO TABS
1.0000 mg | ORAL_TABLET | Freq: Two times a day (BID) | ORAL | Status: DC | PRN
  Administered 2022-03-23: 1 mg via ORAL
  Filled 2022-03-23: qty 1

## 2022-03-23 MED ORDER — ROSUVASTATIN CALCIUM 20 MG PO TABS
20.0000 mg | ORAL_TABLET | Freq: Every day | ORAL | Status: DC
  Administered 2022-03-23 – 2022-03-25 (×3): 20 mg via ORAL
  Filled 2022-03-23 (×3): qty 1

## 2022-03-23 MED ORDER — HYDROCOD POLI-CHLORPHE POLI ER 10-8 MG/5ML PO SUER: 5.0000 mL | Freq: Two times a day (BID) | ORAL | Status: DC | PRN

## 2022-03-23 MED ORDER — INSULIN ASPART 100 UNIT/ML IJ SOLN
0.0000 [IU] | Freq: Three times a day (TID) | INTRAMUSCULAR | Status: DC
  Administered 2022-03-23: 11 [IU] via SUBCUTANEOUS
  Administered 2022-03-24: 15 [IU] via SUBCUTANEOUS
  Administered 2022-03-24: 11 [IU] via SUBCUTANEOUS
  Administered 2022-03-24: 5 [IU] via SUBCUTANEOUS
  Administered 2022-03-25: 11 [IU] via SUBCUTANEOUS
  Administered 2022-03-25 (×2): 3 [IU] via SUBCUTANEOUS
  Administered 2022-03-26 (×2): 11 [IU] via SUBCUTANEOUS

## 2022-03-23 MED ORDER — INSULIN ASPART 100 UNIT/ML IJ SOLN
0.0000 [IU] | Freq: Every day | INTRAMUSCULAR | Status: DC
  Administered 2022-03-23 – 2022-03-24 (×2): 3 [IU] via SUBCUTANEOUS
  Administered 2022-03-25: 2 [IU] via SUBCUTANEOUS

## 2022-03-23 MED ORDER — PREGABALIN 25 MG PO CAPS: 50.0000 mg | ORAL_CAPSULE | Freq: Two times a day (BID) | ORAL | Status: DC

## 2022-03-23 MED ORDER — LACTATED RINGERS IV SOLN: INTRAVENOUS | Status: DC

## 2022-03-23 MED ORDER — METOPROLOL TARTRATE 100 MG PO TABS
100.0000 mg | ORAL_TABLET | Freq: Every day | ORAL | Status: DC
Start: 2022-03-23 — End: 2022-03-26
  Administered 2022-03-23 – 2022-03-25 (×3): 100 mg via ORAL
  Filled 2022-03-23 (×3): qty 1

## 2022-03-23 MED ORDER — PREDNISONE 50 MG PO TABS
50.0000 mg | ORAL_TABLET | Freq: Every day | ORAL | Status: DC
  Administered 2022-03-26: 50 mg via ORAL
  Filled 2022-03-23: qty 1

## 2022-03-23 MED ORDER — POLYETHYLENE GLYCOL 3350 17 G PO PACK: 17.0000 g | PACK | Freq: Every day | ORAL | Status: DC | PRN

## 2022-03-23 MED ORDER — METHYLPREDNISOLONE SODIUM SUCC 125 MG IJ SOLR
0.5000 mg/kg | Freq: Two times a day (BID) | INTRAMUSCULAR | Status: AC
  Administered 2022-03-23 – 2022-03-26 (×5): 41.25 mg via INTRAVENOUS
  Filled 2022-03-23 (×5): qty 2

## 2022-03-23 MED ORDER — ENSURE ENLIVE PO LIQD
237.0000 mL | ORAL | Status: DC
Start: 2022-03-23 — End: 2022-03-24
  Administered 2022-03-23: 237 mL via ORAL

## 2022-03-23 MED ORDER — LORAZEPAM 1 MG PO TABS
1.0000 mg | ORAL_TABLET | Freq: Two times a day (BID) | ORAL | Status: DC
  Administered 2022-03-23 – 2022-03-26 (×6): 1 mg via ORAL
  Filled 2022-03-23 (×6): qty 1

## 2022-03-23 MED ORDER — SODIUM CHLORIDE 0.9% FLUSH
3.0000 mL | Freq: Two times a day (BID) | INTRAVENOUS | Status: DC
  Administered 2022-03-23 (×2): 3 mL via INTRAVENOUS

## 2022-03-23 MED ORDER — BISACODYL 5 MG PO TBEC: 5.0000 mg | DELAYED_RELEASE_TABLET | Freq: Every day | ORAL | Status: DC | PRN

## 2022-03-23 MED ORDER — METOPROLOL TARTRATE 50 MG PO TABS
50.0000 mg | ORAL_TABLET | Freq: Every day | ORAL | Status: DC
  Administered 2022-03-23 – 2022-03-26 (×4): 50 mg via ORAL
  Filled 2022-03-23 (×4): qty 1

## 2022-03-23 MED ORDER — ZINC SULFATE 220 (50 ZN) MG PO CAPS
220.0000 mg | ORAL_CAPSULE | Freq: Every day | ORAL | Status: DC
  Administered 2022-03-24 – 2022-03-26 (×3): 220 mg via ORAL
  Filled 2022-03-23 (×3): qty 1

## 2022-03-23 MED ORDER — VALSARTAN-HYDROCHLOROTHIAZIDE 320-25 MG PO TABS: 1.0000 | ORAL_TABLET | Freq: Every day | ORAL | Status: DC

## 2022-03-23 NOTE — TOC Progression Note (Signed)
Transition of Care [facility]) - Initial/Assessment Note    Patient Details  Name: Donna Green MRN: [id number] Date of Birth: 04/03/1941  Transition [facility] CM/SW Contact:    Milinda Antis, [place] Phone Number: 03/23/2022, 11:50 AM  Clinical Narrative:                  Transition of Care Department [facility]) has reviewed patient. We will continue to monitor patient advancement through interdisciplinary progression rounds. If new patient transition needs arise, please place a TOC consult.          Patient Goals and CMS Choice        Expected Discharge Plan and Services                                                Prior Living Arrangements/Services                       Activities of Daily Living Home Assistive Devices/Equipment: Cane (specify quad or straight) ADL Screening (condition at time of admission) Patient's cognitive ability adequate to safely complete daily activities?: Yes Is the patient deaf or have difficulty hearing?: Yes Does the patient have difficulty seeing, even when wearing glasses/contacts?: No Does the patient have difficulty concentrating, remembering, or making decisions?: No Patient able to express need for assistance with ADLs?: Yes Does the patient have difficulty dressing or bathing?: No Independently performs ADLs?: Yes (appropriate for developmental age) Does the patient have difficulty walking or climbing stairs?: Yes Weakness of Legs: Both Weakness of Arms/Hands: None  Permission Sought/Granted                  Emotional Assessment              Admission diagnosis:  Acute renal failure (ARF) ([place]) [N17.9] Acute cystitis without hematuria [N30.00] AKI (acute kidney injury) ([place]) [N17.9] COVID-19 [U07.1] Patient Active Problem List   Diagnosis Date Noted   Acute renal failure (ARF) ([place]) 03/22/2022   Essential hypertension 03/09/2015   HLD (hyperlipidemia) 03/09/2015   Type 2 diabetes mellitus  with other circulatory complications ([place]) 16/38/4665   History of stroke 03/07/2015   PCP:  Seward Carol, MD Pharmacy:   [facility] [place], [place] [facility] [place] [address] [place] [address] Phone: [phone] Fax: [phone]     Social Determinants of Health (SDOH) Interventions    Readmission Risk Interventions     No data to display

## 2022-03-23 NOTE — H&P (Signed)
History and Physical    Patient: Donna Green XMI:[id number] DOB: September 25, 1940 DOA: 03/22/2022 DOS: the patient was seen and examined on 03/23/2022 PCP: Seward Carol, MD  Patient coming from: Home - lives with daughter; NOK: Daughter, Kaleeah Gingerich, [phone]   Chief Complaint: abnormal lab  HPI: Donna Green is a 81 y.o. female with medical history significant of DM, HLD, HTN, CKD, and CVA with residual R hemiparesis presenting with AKI.  She reports that she developed her current symptoms about 2 weeks ago (7/28).  She has had nasal congestion, some SOB, cough, and severe diarrhea.  Her diarrhea has resolved.  No n/v.  She was seen by her PCP on 8/8 and diagnosis with UTI and AKI.  She was treated with Cipro.    Her baseline creatinine is in the 1.5-1.7 range and she is followed by nephrology.  History was limited by the patient's severe hearing loss and difficulty communicating while wearing PPE; there was no family in the room at the time of my evaluation.    ER Course:  [facility] transfer, per Dr. [place]  AKI, + COVID, weakness, UTI     Review of Systems: As mentioned in the history of present illness. All other systems reviewed and are negative. Past Medical History:  Diagnosis Date   Anxiety 03/23/2022   Chronic kidney disease    Diabetes mellitus without complication ([place])    Headache    Hyperlipidemia    Hypertension    Right hemiparesis ([place])    Small bowel obstruction ([place])    Stroke ([place])    x2   Past Surgical History:  Procedure Laterality Date   BLADDER SURGERY     BOWEL RESECTION     EYE SURGERY     TUBAL LIGATION     Social History:  reports that she has never smoked. She has never used smokeless tobacco. She reports that she does not drink alcohol and does not use drugs.  Allergies  Allergen Reactions   Codeine Other (See Comments)    Paranoid  Hallucinations    Colcrys [Colchicine] Nausea Only   Zyloprim [Allopurinol] Nausea Only    Nsaids Other (See Comments)    Stage 3 kidney disease    Family History  Problem Relation Age of Onset   Diabetes Mother    Heart disease Mother    Parkinsonism Father    Diabetes Sister    Diabetes Brother    Cancer Brother    Diabetes Maternal Grandmother     Prior to Admission medications   Medication Sig Start Date End Date Taking? Authorizing Provider  acetaminophen (TYLENOL) 325 MG tablet Take 650 mg by mouth 2 (two) times daily.   Yes [provider]  amLODipine (NORVASC) 10 MG tablet Take 10 mg by mouth daily.   Yes [provider]  ciprofloxacin (CIPRO) 250 MG tablet Take 250 mg by mouth daily. 5 day course. Pt on day 2 03/22/22  Yes [provider]  dextromethorphan (DELSYM) 30 MG/5ML liquid Take 30 mg by mouth daily as needed for cough.   Yes [provider]  dipyridamole-aspirin (AGGRENOX) 200-25 MG per 12 hr capsule Take 1 capsule by mouth 2 (two) times daily.   Yes [provider]  insulin aspart (NOVOLOG FLEXPEN) 100 UNIT/ML FlexPen Inject 8 Units into the skin 3 (three) times daily with meals.   Yes [provider]  insulin glargine (LANTUS) 100 UNIT/ML injection Inject 14 Units into the skin at bedtime.  Yes [provider]  LORazepam (ATIVAN) 1 MG tablet Take 1 mg by mouth 2 (two) times daily.   Yes [provider]  Menthol, Topical Analgesic, (ZIMS MAX-FREEZE EX) Apply 1 Application topically 2 (two) times daily as needed (pain).   Yes [provider]  metoprolol (LOPRESSOR) 100 MG tablet Take 50-100 mg by mouth See admin instructions. 50 mg in the morning, 100 mg in the evening   Yes [provider]  rosuvastatin (CRESTOR) 20 MG tablet Take 20 mg by mouth daily.   Yes [provider]  KERENDIA 10 MG TABS Take 10 mg by mouth daily. Patient not taking: Reported on 03/23/2022 03/02/22   [provider]    Physical Exam: Vitals:   03/23/22 0630 03/23/22 0700  03/23/22 0845 03/23/22 1036  BP: 126/72 133/68  (!) 141/69  Pulse: (!) 106 (!) 108 (!) 111 (!) 103  Resp: '18 18 19 19  '$ Temp:    99.1 F (37.3 C)  TempSrc:    Oral  SpO2: 97% 97% 100% 98%  Weight:      Height:       General:  Appears mildly ill-appearing, in NAD Eyes:  EOMI, normal lids, iris ENT:  hard of hearing, mildly dry lips & tongue, mildly dry mm Neck:  no LAD, masses or thyromegaly Cardiovascular:  RR with mild tachycardia, no m/r/g. No LE edema.  Respiratory:   CTA bilaterally with no wheezes/rales/rhonchi.  Normal respiratory effort. Abdomen:  soft, NT, ND Skin:  no rash or induration seen on limited exam Musculoskeletal:  grossly normal tone BUE/BLE, good ROM, no bony abnormality Psychiatric:  blunted mood and affect, speech fluent and appropriate, AOx3 Neurologic:  CN 2-12 grossly intact, moves all extremities in coordinated fashion   Radiological Exams on Admission: Independently reviewed - see discussion in A/P where applicable  DG Chest Portable 1 View  Result Date: 03/22/2022 CLINICAL DATA:  Weakness EXAM: PORTABLE CHEST 1 VIEW COMPARISON:  03/18/2019 FINDINGS: Heart is normal size. Large hiatal hernia. No confluent airspace opacities or effusions. No acute bony abnormality. Aortic atherosclerosis. IMPRESSION: Large hiatal hernia. No active disease. Electronically Signed   By: Rolm Baptise M.D.   On: 03/22/2022 20:12   CT Renal Stone Study  Result Date: 03/22/2022 CLINICAL DATA:  Flank pain, kidney stone suspected EXAM: CT ABDOMEN AND PELVIS WITHOUT CONTRAST TECHNIQUE: Multidetector CT imaging of the abdomen and pelvis was performed following the standard protocol without IV contrast. RADIATION DOSE REDUCTION: This exam was performed according to the departmental dose-optimization program which includes automated exposure control, adjustment of the mA and/or kV according to patient size and/or use of iterative reconstruction technique. COMPARISON:  None Available.  FINDINGS: Lower chest: Large hiatal hernia. Heart is normal size. No acute abnormality. Hepatobiliary: Gallbladder is mildly distended. No visible stones or biliary ductal dilatation. No focal hepatic abnormality. Pancreas: No focal abnormality or ductal dilatation. Spleen: No focal abnormality.  Normal size. Adrenals/Urinary Tract: 2 cm low-density lesion in the midpole of the left kidney. 1.5 cm low-density lesion in the lower pole of the left kidney. These are difficult to characterize on this noncontrast study. 3 cm low-density lesion off the lower pole of the right kidney which appears to be cystic. Other smaller low-density areas in the right kidney. No stones or hydronephrosis. Adrenal glands and urinary bladder unremarkable. Stomach/Bowel: Sigmoid diverticulosis. No active diverticulitis. Stomach and small bowel decompressed, unremarkable. Normal appendix Vascular/Lymphatic: No evidence of aneurysm or adenopathy. Aortoiliac atherosclerosis. Reproductive: Prior hysterectomy.  No adnexal masses. Other: No free fluid or free air. Musculoskeletal: Mild compression fracture through the inferior endplate of L2. Advanced degenerative changes throughout the lumbar spine most pronounced at the lumbosacral junction. Grade 1 anterolisthesis of L5 on S1. IMPRESSION: Mildly distended gallbladder. No visible stones. If there is clinical concern for cholelithiasis or cholecystitis, recommend further evaluation with right upper quadrant ultrasound. Low-density lesions within both kidneys, some which are difficult to characterize on this noncontrast study. These could be further characterized with elective renal ultrasound if felt clinically indicated. Large hiatal hernia. Aortic atherosclerosis. Sigmoid diverticulosis. Electronically Signed   By: Rolm Baptise M.D.   On: 03/22/2022 20:11    EKG: Independently reviewed.  Sinus tachycardia with rate 105; nonspecific ST changes with no evidence of acute ischemia   Labs on  Admission: I have personally reviewed the available labs and imaging studies at the time of the admission.  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 infection   Anxiety    COVID-19 infection -Patient with presenting with mild SOB, URI symptoms, and prior severe diarrhea (now improved) -She does not have a current or home O2 requirement  -COVID POSITIVE -The patient has comorbidities which may increase the risk for ARDS/MODS including: age, HTN, DM, CKD -COVID labs have been ordered -CXR negative -Will not treat with broad-spectrum antibiotics  -Will admit for further evaluation, close monitoring, and treatment -At this time, will attempt to avoid use of aerosolized medications and use HFAs instead -Will check daily labs including CMP and CBC -Will order steroids given ongoing symptoms, but she is out of the window for remdesivir, Paxlovid -PT/OT consults -Encourage mobilization/ambulation as much as possible -Patient was seen wearing full PPE including: gown, gloves, head cover, N95, and face shield; donning and doffing was in compliance with current standards.  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 -Last recorded GFR in Epic was 24 in 2020, currently 53 -[facility] records show creatinine 1.51 and GFR 34 in 03/2021 -Will provide gentle IVF hydration -Of note, COVID patients are at increased respiratory risk if volume overloaded so need to stop IVF once she is euvolemic -Recheck BMP in AM  Possible UTI -UA from clinic  8/9 with 1+ blood, 1+ LE, 2+ protein, moderate bacteria - not overly suggestive of UTI -She was given Cipro in clinic and then given a dose of Rocephin in the ER -Current UA is also not overly concerning but is also post-abx -Will monitor without further treatment for now -Urine culture is pending  DM -A1c on 8/8 was 9.5, indicating poor control -May utilize continuous glucose monitoring -Continue glargine -Cover with moderate-scale SSI  HTN -Continue amlodipine, metoprolol  HLD -Continue Crestor -Her recent LDL was 3, total cholesterol 42 (!)  Anxiety -Continue lorazepam  H/o CVA -Continue Aggrenox    Advance Care Planning:   Code Status: Full Code   Consults: PT/OT; RT  DVT Prophylaxis: Lovenox  Family Communication: None present; I was unable to reach her daughter by telephone and left a voice mail  Severity of Illness: The appropriate patient status for this patient is INPATIENT. Inpatient status is judged to be reasonable and necessary in order to provide the required intensity of service to ensure the patient's safety. The patient's presenting symptoms, physical exam findings, and initial radiographic and laboratory data in the context of their chronic comorbidities is felt to place them at high risk for further clinical deterioration. Furthermore, it is not anticipated that the patient will be medically stable for discharge from the hospital within 2 midnights of admission.   * I certify that at the point of admission it is my clinical judgment that the patient will require inpatient hospital care spanning beyond 2 midnights from the point of admission due to high intensity of service, high risk for further deterioration and high frequency of surveillance required.*  Author: Karmen Bongo, MD 03/23/2022 2:33 PM  For on call review [email or web address].

## 2022-03-23 NOTE — Progress Notes (Signed)
NEW ADMISSION NOTE New Admission Note:   Arrival Method: stretcher Mental Orientation: A&OX4 Telemetry:none Assessment: Completed Skin:intact dry flaky IV: LAC Pain:0/10 Tubes:NONE Safety Measures: Safety Fall Prevention Plan has been given, discussed and signed Admission: Completed [facility] Orientation: Patient has been orientated to the room, unit and staff.  Family:daughter at bedside  Orders have been reviewed and implemented. Will continue to monitor the patient. Call light has been placed within reach and bed alarm has been activated.   Maximus Hoffert S Ladd Cen, RN

## 2022-03-23 NOTE — Progress Notes (Signed)
Initial Nutrition Assessment  DOCUMENTATION CODES:  Not applicable  INTERVENTION:  Continue current diet as ordered MVI with minerals daily Set end date for zinc (added by provider) Ensure Enlive po 1x/d, each supplement provides 350 kcal and 20 grams of protein.  NUTRITION DIAGNOSIS:  Inadequate oral intake related to decreased appetite as evidenced by per patient/family report.  GOAL:  Patient will meet greater than or equal to 90% of their needs  MONITOR:  PO intake, I & O's, Supplement acceptance  REASON FOR ASSESSMENT:  Malnutrition Screening Tool    ASSESSMENT:   Pt with hx of DM type 2, CKD, HTN, HLD, and hx CVA presented to ED at PCP recommendation for AKI. Reports a cold with GI symptoms for several weeks. Found to be positive for COVID19  Met with pt in room. Tray at bedside well consumed. Pt reports appetite is getting better but prior to the last few days intake was poor due to GI symptoms and not feeling well. Endorses weight loss.    Pt agreeable to nutrition supplements, prefers vanilla.  Nutritionally Relevant Medications: Scheduled Meds:  vitamin C  500 mg Oral Daily   insulin aspart  0-15 Units Subcutaneous TID WC   insulin aspart  0-5 Units Subcutaneous QHS   insulin glargine-yfgn  34 Units Subcutaneous QHS   methylPREDNISolone injection  0.5 mg/kg Intravenous Q12H   rosuvastatin  20 mg Oral q1800   zinc sulfate  220 mg Oral Daily   Continuous Infusions:  sodium chloride     Labs Reviewed: Na 134 BUN 72, creatinine 3.24 CBG ranges from 156 - 191 mg/dL over the last 24 hours  NUTRITION - FOCUSED PHYSICAL EXAM: Flowsheet Row Most Recent Value  Orbital Region No depletion  Upper Arm Region Mild depletion  Thoracic and Lumbar Region No depletion  Buccal Region No depletion  Temple Region Mild depletion  Clavicle Bone Region No depletion  Clavicle and Acromion Bone Region No depletion  Scapular Bone Region No depletion  Dorsal Hand Mild  depletion  Patellar Region Moderate depletion  Anterior Thigh Region Moderate depletion  Posterior Calf Region Moderate depletion  Hair Reviewed  Eyes Reviewed  Mouth Reviewed  Skin Reviewed  Nails Reviewed     Diet Order:   Diet Order             Diet Carb Modified Fluid consistency: Thin; Room service appropriate? Yes  Diet effective now                   EDUCATION NEEDS:  Education needs have been addressed  Skin:  Skin Assessment: Reviewed RN Assessment  Last BM:  8/8  Height:  Ht Readings from Last 1 Encounters:  03/22/22 5' 5.5" (1.664 m)    Weight:  Wt Readings from Last 1 Encounters:  03/22/22 82.7 kg    Ideal Body Weight:  59.1 kg  BMI:  Body mass index is 29.88 kg/m.  Estimated Nutritional Needs:  Kcal:  1700-1900 kcal/d Protein:  85-95g/d Fluid:  1.8-2L/d    Ranell Patrick, RD, LDN Clinical Dietitian RD pager # available in [place]  After hours/weekend pager # available in [facility]

## 2022-03-24 DIAGNOSIS — N189 Chronic kidney disease, unspecified: Secondary | ICD-10-CM | POA: Diagnosis not present

## 2022-03-24 DIAGNOSIS — U071 COVID-19: Secondary | ICD-10-CM

## 2022-03-24 DIAGNOSIS — N179 Acute kidney failure, unspecified: Secondary | ICD-10-CM | POA: Diagnosis not present

## 2022-03-24 LAB — GLUCOSE, CAPILLARY
Glucose-Capillary: 360 mg/dL — ABNORMAL HIGH (ref 70–99)
Glucose-Capillary: 320 mg/dL — ABNORMAL HIGH (ref 70–99)
Glucose-Capillary: 220 mg/dL — ABNORMAL HIGH (ref 70–99)
Glucose-Capillary: 275 mg/dL — ABNORMAL HIGH (ref 70–99)

## 2022-03-24 LAB — CBC WITH DIFFERENTIAL/PLATELET
Abs Immature Granulocytes: 0.08 10*3/uL — ABNORMAL HIGH (ref 0.00–0.07)
Basophils Absolute: 0 10*3/uL (ref 0.0–0.1)
Basophils Relative: 0 %
Eosinophils Absolute: 0 10*3/uL (ref 0.0–0.5)
Eosinophils Relative: 0 %
HCT: 32.5 % — ABNORMAL LOW (ref 36.0–46.0)
Hemoglobin: 10.8 g/dL — ABNORMAL LOW (ref 12.0–15.0)
Immature Granulocytes: 1 %
Lymphocytes Relative: 7 %
Lymphs Abs: 0.7 10*3/uL (ref 0.7–4.0)
MCH: 33.6 pg (ref 26.0–34.0)
MCHC: 33.2 g/dL (ref 30.0–36.0)
MCV: 101.2 fL — ABNORMAL HIGH (ref 80.0–100.0)
Monocytes Absolute: 0.1 10*3/uL (ref 0.1–1.0)
Monocytes Relative: 1 %
Neutro Abs: 9.4 10*3/uL — ABNORMAL HIGH (ref 1.7–7.7)
Neutrophils Relative %: 91 %
Platelets: 452 10*3/uL — ABNORMAL HIGH (ref 150–400)
RBC: 3.21 MIL/uL — ABNORMAL LOW (ref 3.87–5.11)
RDW: 14.3 % (ref 11.5–15.5)
WBC: 10.3 10*3/uL (ref 4.0–10.5)
nRBC: 0 % (ref 0.0–0.2)

## 2022-03-24 LAB — COMPREHENSIVE METABOLIC PANEL
ALT: 32 U/L (ref 0–44)
AST: 35 U/L (ref 15–41)
Albumin: 2.1 g/dL — ABNORMAL LOW (ref 3.5–5.0)
Alkaline Phosphatase: 91 U/L (ref 38–126)
Anion gap: 9 (ref 5–15)
BUN: 53 mg/dL — ABNORMAL HIGH (ref 8–23)
CO2: 18 mmol/L — ABNORMAL LOW (ref 22–32)
Calcium: 8.4 mg/dL — ABNORMAL LOW (ref 8.9–10.3)
Chloride: 106 mmol/L (ref 98–111)
Creatinine, Ser: 2.54 mg/dL — ABNORMAL HIGH (ref 0.44–1.00)
GFR, Estimated: 18 mL/min — ABNORMAL LOW (ref >60)
Glucose, Bld: 387 mg/dL — ABNORMAL HIGH (ref 70–99)
Potassium: 5.7 mmol/L — ABNORMAL HIGH (ref 3.5–5.1)
Sodium: 133 mmol/L — ABNORMAL LOW (ref 135–145)
Total Bilirubin: 0.4 mg/dL (ref 0.3–1.2)
Total Protein: 7.3 g/dL (ref 6.5–8.1)

## 2022-03-24 LAB — URINE CULTURE

## 2022-03-24 MED ORDER — SODIUM ZIRCONIUM CYCLOSILICATE 5 G PO PACK
5.0000 g | PACK | Freq: Once | ORAL | Status: AC
Start: 2022-03-24 — End: 2022-03-24
  Administered 2022-03-24: 5 g via ORAL
  Filled 2022-03-24: qty 1

## 2022-03-24 MED ORDER — GLUCERNA SHAKE PO LIQD
237.0000 mL | Freq: Three times a day (TID) | ORAL | Status: DC
  Administered 2022-03-24 – 2022-03-26 (×4): 237 mL via ORAL

## 2022-03-24 NOTE — Evaluation (Signed)
Occupational Therapy Evaluation Patient Details Name: Donna Green MRN: [id number] DOB: 04-06-1941 Today's Date: 03/24/2022   History of Present Illness Pt is 81 yo female who presents on 03/22/22 with AKI, covid +, weakness and UTI. PMH: DM, HLD, HTN, CKD, HOH, and CVA x2 with residual R hemiparesis.   Clinical Impression   Pt independent at baseline with ADLs and functional mobility, lives with family who can assist at d/c. Pt currently needing min guard A for ADLs and transfers without AD. Very HOH during evaluation, however cognition appearing WFL as pt performing tasks appropriately with repetition. Pt presenting with impairments listed below, will follow acutely. Recommend d/c home with family assistance.      Recommendations for follow up therapy are one component of a multi-disciplinary discharge planning process, led by the attending physician.  Recommendations may be updated based on patient status, additional functional criteria and insurance authorization.   Follow Up Recommendations  No OT follow up    Assistance Recommended at Discharge Intermittent Supervision/Assistance  Patient can return home with the following A little help with walking and/or transfers;A little help with bathing/dressing/bathroom;Assistance with cooking/housework;Direct supervision/assist for medications management;Direct supervision/assist for financial management;Assist for transportation;Help with stairs or ramp for entrance    Functional Status Assessment  Patient has had a recent decline in their functional status and demonstrates the ability to make significant improvements in function in a reasonable and predictable amount of time.  Equipment Recommendations  None recommended by OT    Recommendations for Other Services PT consult     Precautions / Restrictions Precautions Precautions: Fall;Other (comment) Precaution Comments: covid, HOH Restrictions Weight Bearing Restrictions: No       Mobility Bed Mobility               General bed mobility comments: up in recliner upon arrival    Transfers Overall transfer level: Needs assistance Equipment used: None Transfers: Sit to/from Stand Sit to Stand: Min guard           General transfer comment: min guard for safety      Balance Overall balance assessment: Needs assistance Sitting-balance support: No upper extremity supported, Feet supported Sitting balance-Leahy Scale: Good     Standing balance support: No upper extremity supported Standing balance-Leahy Scale: Fair                             ADL either performed or assessed with clinical judgement   ADL Overall ADL's : Needs assistance/impaired Eating/Feeding: Modified independent   Grooming: Min guard;Wash/dry hands   Upper Body Bathing: Min guard   Lower Body Bathing: Min guard   Upper Body Dressing : Min guard   Lower Body Dressing: Min guard   Toilet Transfer: Min guard;Ambulation;Regular Museum/gallery exhibitions officer and Hygiene: Min guard       Functional mobility during ADLs: Min guard;Rolling walker (2 wheels)       Vision   Vision Assessment?: No apparent visual deficits     Perception     Praxis      Pertinent Vitals/Pain Pain Assessment Pain Assessment: No/denies pain     Hand Dominance Right   Extremity/Trunk Assessment Upper Extremity Assessment Upper Extremity Assessment: Generalized weakness   Lower Extremity Assessment Lower Extremity Assessment: Defer to PT evaluation   Cervical / Trunk Assessment Cervical / Trunk Assessment: Normal   Communication Communication Communication: HOH   Cognition Arousal/Alertness: Awake/alert Behavior During Therapy: WFL for  tasks assessed/performed Overall Cognitive Status: Difficult to assess                                 General Comments: difficult to assess with HOH, gave many incorrect answers but seems more due to  [facility] than cognition, follows commands with repetition     General Comments  VSS on RA    Exercises     Shoulder Instructions      Home Living Family/patient expects to be discharged to:: Private residence Living Arrangements: Children Available Help at Discharge: Family;Available 24 hours/day Type of Home: House Home Access: Stairs to enter [employer] of Steps: 1 Entrance Stairs-Rails: None Home Layout: One level     Bathroom Shower/Tub: Tub/shower unit         Home Equipment: Conservation officer, nature (2 wheels)   Additional Comments: pt very HOH (esp difficult with covid prec) making it hard to get this info correctly      Prior Functioning/Environment Prior Level of Function : Independent/Modified Independent             Mobility Comments: pt reports she ambulated independently. Does not sound like she drives ADLs Comments: she reports independence with ADL's. States that her kids and grandkids do the cooking        OT Problem List: Decreased activity tolerance;Impaired balance (sitting and/or standing);Cardiopulmonary status limiting activity      OT Treatment/Interventions: Self-care/ADL training;Therapeutic exercise;Energy conservation;Therapeutic activities;Patient/family education;Balance training    OT Goals(Current goals can be found in the care plan section) Acute Rehab OT Goals Patient Stated Goal: none stated OT Goal Formulation: With patient Time For Goal Achievement: 04/07/22 Potential to Achieve Goals: Good ADL Goals Pt Will Perform Upper Body Dressing: Independently;sitting;standing Pt Will Perform Lower Body Dressing: Independently;sitting/lateral leans;sit to/from stand Pt Will Transfer to Toilet: Independently;ambulating;regular height toilet Pt Will Perform Tub/Shower Transfer: Shower transfer;Tub transfer;Independently;ambulating  OT Frequency: Min 2X/week    Co-evaluation PT/OT/SLP Co-Evaluation/Treatment: Yes Reason for  Co-Treatment: For patient/therapist safety;To address functional/ADL transfers PT goals addressed during session: Mobility/safety with mobility;Balance;Proper use of DME OT goals addressed during session: ADL's and self-care;Strengthening/ROM      AM-PAC OT "6 Clicks" Daily Activity     Outcome Measure Help from another person eating meals?: None Help from another person taking care of personal grooming?: None Help from another person toileting, which includes using toliet, bedpan, or urinal?: None Help from another person bathing (including washing, rinsing, drying)?: A Little Help from another person to put on and taking off regular upper body clothing?: None Help from another person to put on and taking off regular lower body clothing?: A Little 6 Click Score: 22   End of Session Equipment Utilized During Treatment: Gait belt Nurse Communication: Mobility status  Activity Tolerance: Patient tolerated treatment well Patient left: in chair;with call bell/phone within reach  OT Visit Diagnosis: Unsteadiness on feet (R26.81);Other abnormalities of gait and mobility (R26.89);Muscle weakness (generalized) (M62.81)                Time: 2993-7169 OT Time Calculation (min): 25 min Charges:  OT General Charges $OT Visit: 1 Visit OT Evaluation $OT Eval Low Complexity: 1 Low  Lynnda Child, OTD, OTR/L Acute Rehab [phone]) [phone] [place] 03/24/2022, 3:27 PM

## 2022-03-24 NOTE — Progress Notes (Signed)
  Progress Note   Patient: Donna Green ZOX:[id number] DOB: 1941-05-01 DOA: 03/22/2022     1 DOS: the patient was seen and examined on 03/24/2022   Brief hospital course: 81 y.o. female with medical history significant of DM, HLD, HTN, CKD, and CVA with residual R hemiparesis presenting with AKI.  She reports that she developed her current symptoms about 2 weeks ago (7/28).  She has had nasal congestion, some SOB, cough, and severe diarrhea.  Her diarrhea has resolved.  No n/v.  She was seen by her PCP on 8/8 and diagnosis with UTI and AKI.  She was treated with Cipro.    Her baseline creatinine is in the 1.5-1.7 range and she is followed by nephrology. Pt found to be COVID pos  Assessment and Plan: COVID-19 infection -Patient with presenting with mild SOB, URI symptoms, and prior severe diarrhea (now improved) -She does not have a current or home O2 requirement  -COVID POSITIVE -On steroids given ongoing symptoms, but she is out of the window for remdesivir, Paxlovid -PT/OT consulted with recs for HHPT -Encourage mobilization/ambulation as much as possible -Patient was seen wearing full PPE including: gown, gloves, head cover, N95, and face shield; donning and doffing was in compliance with current standards.   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 -Last recorded GFR in Epic was 24 in 2020, currently 5 -[facility] show creatinine 1.51 and GFR 34 in 03/2021 -Cr improving with IVF, Cr down to 2.54 -Cont IVF and repeat bmet in AM   Possible UTI -UA from clinic 8/9 with 1+ blood, 1+ LE, 2+ protein, moderate bacteria - not overly suggestive of UTI -She was given Cipro in clinic and then given a dose of Rocephin in the ER -Current UA is also not overly concerning but is also post-abx -Cont to monitor without further treatment for now -Urine culture is inconsistent   DM -A1c on 8/8 was 9.5, indicating poor control -May utilize continuous glucose monitoring -Continue glargine -Cover with moderate-scale  SSI   HTN -Continue amlodipine, metoprolol   HLD -Continue Crestor -Her recent LDL was 3, total cholesterol 42 (!)   Anxiety -Continue lorazepam   H/o CVA -Continue Aggrenox  Hyperkalemia -Will give lokelma -Repeat bmet in AM  Hyponatremia -Repeat bmet in AM       Subjective: Feeling much better today. Eager to go home  Physical Exam: Vitals:   03/23/22 1657 03/23/22 2128 03/24/22 0445 03/24/22 0839  BP: 130/67 126/72 (!) 141/69 136/69  Pulse: 93 99 80 93  Resp: '17 16  16  '$ Temp: 98.9 F (37.2 C) 98.7 F (37.1 C) 98.4 F (36.9 C) 98.2 F (36.8 C)  TempSrc: Oral Oral Oral Oral  SpO2: 90% 95% 96% 96%  Weight:      Height:       General exam: Awake, laying in bed, in nad Respiratory system: Normal respiratory effort, no wheezing Cardiovascular system: regular rate, s1, s2 Gastrointestinal system: Soft, nondistended, positive BS Central nervous system: CN2-12 grossly intact, strength intact Extremities: Perfused, no clubbing Skin: Normal skin turgor, no notable skin lesions seen Psychiatry: Mood normal // no visual hallucinations   Data Reviewed:  Labs reviewed: Na 133, K 5.7, Cr 2.54, Hgb 10.8   Family Communication: Pt in room, family at bedside  Disposition: Status is: Inpatient Remains inpatient appropriate because: Severity of illness  Planned Discharge Destination: Home    Author: Marylu Lund, MD 03/24/2022 4:25 PM  For on call review [email or web address].

## 2022-03-24 NOTE — Hospital Course (Signed)
81 y.o. female with medical history significant of DM, HLD, HTN, CKD, and CVA with residual R hemiparesis presenting with AKI.  She reports that she developed her current symptoms about 2 weeks ago (7/28).  She has had nasal congestion, some SOB, cough, and severe diarrhea.  Her diarrhea has resolved.  No n/v.  She was seen by her PCP on 8/8 and diagnosis with UTI and AKI.  She was treated with Cipro.    Her baseline creatinine is in the 1.5-1.7 range and she is followed by nephrology. Pt found to be COVID pos

## 2022-03-24 NOTE — Progress Notes (Signed)
Pt lost IV access and has Solu-Medrol due at 0130. Pt stated she was told that she is going home tomorrow and does not want to have another IV unless she has to. This RN messaged MD on call to see. RN awaiting response.   Donna Green

## 2022-03-24 NOTE — Evaluation (Signed)
Physical Therapy Evaluation Patient Details Name: Donna Green MRN: [id number] DOB: [birth year] Today's Date: 03/24/2022  History of Present Illness  Pt is 81 yo female who presents on 03/22/22 with AKI, covid +, weakness and UTI. PMH: DM, HLD, HTN, CKD, HOH, and CVA x2 with residual R hemiparesis.  Clinical Impression  Pt admitted with above diagnosis. Pt from home with family where she ambulated independently and had some assist for IADL's it sounds like, difficult to collect info due to pt's HOH. Pt endorses hoarseness and noted to have occasional cough during evaluation. Pt ambulated 20' 2x without AD and with min A for safety. She was mildly unsteady and noted to have increased reaction time, increasing fall risk. VSS remained stable during mobility with SPO2 in 90's.  Pt currently with functional limitations due to the deficits listed below (see PT Problem List). Pt will benefit from skilled PT to increase their independence and safety with mobility to allow discharge to the venue listed below.          Recommendations for follow up therapy are one component of a multi-disciplinary discharge planning process, led by the attending physician.  Recommendations may be updated based on patient status, additional functional criteria and insurance authorization.  Follow Up Recommendations Home health PT      Assistance Recommended at Discharge Intermittent Supervision/Assistance  Patient can return home with the following  A little help with walking and/or transfers;A little help with bathing/dressing/bathroom;Help with stairs or ramp for entrance;Assistance with cooking/housework    Equipment Recommendations None recommended by PT  Recommendations for Other Services       Functional Status Assessment Patient has had a recent decline in their functional status and demonstrates the ability to make significant improvements in function in a reasonable and predictable amount of time.      Precautions / Restrictions Precautions Precautions: Fall;Other (comment) Precaution Comments: covid Restrictions Weight Bearing Restrictions: No      Mobility  Bed Mobility               General bed mobility comments: pt received in recliner    Transfers Overall transfer level: Needs assistance Equipment used: None Transfers: Sit to/from Stand Sit to Stand: Min guard           General transfer comment: min guard for safety    Ambulation/Gait Ambulation/Gait assistance: Min assist Gait Distance (Feet): 20 Feet (2x) Assistive device: None Gait Pattern/deviations: Step-through pattern, Decreased stride length Gait velocity: decreased Gait velocity interpretation: <1.8 ft/sec, indicate of risk for recurrent falls   General Gait Details: decreased step length, minimal arm swing and mild unsteadiness, especially with turning. Pt also moves slowly with increased reaction time increasing fall risk  Stairs            Wheelchair Mobility    Modified Rankin (Stroke Patients Only)       Balance Overall balance assessment: Needs assistance Sitting-balance support: No upper extremity supported, Feet supported Sitting balance-Leahy Scale: Good     Standing balance support: No upper extremity supported Standing balance-Leahy Scale: Fair                               Pertinent Vitals/Pain Pain Assessment Pain Assessment: No/denies pain    Home Living Family/patient expects to be discharged to:: Private residence Living Arrangements: Children Available Help at Discharge: Family;Available 24 hours/day Type of Home: House Home Access: Stairs to enter Entrance Stairs-Rails: None  Entrance Stairs-Number of Steps: 1   Home Layout: One level Home Equipment: Conservation officer, nature (2 wheels) Additional Comments: pt very HOH (esp difficult with covid prec) making it hard to get this info correctly    Prior Function Prior Level of Function :  Independent/Modified Independent             Mobility Comments: pt reports she ambulated independently. Does not sound like she drives ADLs Comments: she reports independence with ADL's. States that her kids and grandkids do the cooking     Hand Dominance   Dominant Hand: Right    Extremity/Trunk Assessment   Upper Extremity Assessment Upper Extremity Assessment: Defer to OT evaluation    Lower Extremity Assessment Lower Extremity Assessment: Generalized weakness (R=L)    Cervical / Trunk Assessment Cervical / Trunk Assessment: Normal  Communication   Communication: HOH  Cognition Arousal/Alertness: Awake/alert Behavior During Therapy: WFL for tasks assessed/performed Overall Cognitive Status: Difficult to assess                                 General Comments: difficult to assess with HOH, gave many incorrect answers but seems more due to [facility] than cognition        General Comments General comments (skin integrity, edema, etc.): VSS on RA, SPO2 remained in 90's    Exercises     Assessment/Plan    PT Assessment Patient needs continued PT services  PT Problem List Decreased strength;Decreased activity tolerance;Decreased balance;Decreased mobility;Cardiopulmonary status limiting activity       PT Treatment Interventions DME instruction;Gait training;Stair training;Functional mobility training;Therapeutic activities;Therapeutic exercise;Balance training;Patient/family education    PT Goals (Current goals can be found in the Care Plan section)  Acute Rehab PT Goals Patient Stated Goal: return home PT Goal Formulation: With patient Time For Goal Achievement: 04/07/22 Potential to Achieve Goals: Good    Frequency Min 3X/week     Co-evaluation PT/OT/SLP Co-Evaluation/Treatment: Yes Reason for Co-Treatment: For patient/therapist safety;Complexity of the patient's impairments (multi-system involvement) PT goals addressed during session:  Mobility/safety with mobility;Balance;Proper use of DME         AM-PAC PT "6 Clicks" Mobility  Outcome Measure Help needed turning from your back to your side while in a flat bed without using bedrails?: None Help needed moving from lying on your back to sitting on the side of a flat bed without using bedrails?: None Help needed moving to and from a bed to a chair (including a wheelchair)?: A Little Help needed standing up from a chair using your arms (e.g., wheelchair or bedside chair)?: A Little Help needed to walk in hospital room?: A Little Help needed climbing 3-5 steps with a railing? : A Little 6 Click Score: 20    End of Session Equipment Utilized During Treatment: Gait belt Activity Tolerance: Patient tolerated treatment well Patient left: in chair;with call bell/phone within reach Nurse Communication: Mobility status PT Visit Diagnosis: Unsteadiness on feet (R26.81);Muscle weakness (generalized) (M62.81);Difficulty in walking, not elsewhere classified (R26.2)    Time: 8127-5170 PT Time Calculation (min) (ACUTE ONLY): 23 min   Charges:   PT Evaluation $PT Eval Moderate Complexity: [place] chat preferred Office [place] 03/24/2022, 2:38 PM

## 2022-03-25 DIAGNOSIS — N179 Acute kidney failure, unspecified: Secondary | ICD-10-CM | POA: Diagnosis not present

## 2022-03-25 DIAGNOSIS — U071 COVID-19: Secondary | ICD-10-CM | POA: Diagnosis not present

## 2022-03-25 DIAGNOSIS — N189 Chronic kidney disease, unspecified: Secondary | ICD-10-CM | POA: Diagnosis not present

## 2022-03-25 LAB — GLUCOSE, CAPILLARY
Glucose-Capillary: 338 mg/dL — ABNORMAL HIGH (ref 70–99)
Glucose-Capillary: 195 mg/dL — ABNORMAL HIGH (ref 70–99)
Glucose-Capillary: 160 mg/dL — ABNORMAL HIGH (ref 70–99)
Glucose-Capillary: 235 mg/dL — ABNORMAL HIGH (ref 70–99)

## 2022-03-25 LAB — CBC WITH DIFFERENTIAL/PLATELET
Abs Immature Granulocytes: 0.16 10*3/uL — ABNORMAL HIGH (ref 0.00–0.07)
Basophils Absolute: 0 10*3/uL (ref 0.0–0.1)
Basophils Relative: 0 %
Eosinophils Absolute: 0 10*3/uL (ref 0.0–0.5)
Eosinophils Relative: 0 %
HCT: 28.9 % — ABNORMAL LOW (ref 36.0–46.0)
Hemoglobin: 9.6 g/dL — ABNORMAL LOW (ref 12.0–15.0)
Immature Granulocytes: 1 %
Lymphocytes Relative: 7 %
Lymphs Abs: 1 10*3/uL (ref 0.7–4.0)
MCH: 33.3 pg (ref 26.0–34.0)
MCHC: 33.2 g/dL (ref 30.0–36.0)
MCV: 100.3 fL — ABNORMAL HIGH (ref 80.0–100.0)
Monocytes Absolute: 0.8 10*3/uL (ref 0.1–1.0)
Monocytes Relative: 5 %
Neutro Abs: 13.3 10*3/uL — ABNORMAL HIGH (ref 1.7–7.7)
Neutrophils Relative %: 87 %
Platelets: 456 10*3/uL — ABNORMAL HIGH (ref 150–400)
RBC: 2.88 MIL/uL — ABNORMAL LOW (ref 3.87–5.11)
RDW: 14.1 % (ref 11.5–15.5)
WBC: 15.3 10*3/uL — ABNORMAL HIGH (ref 4.0–10.5)
nRBC: 0 % (ref 0.0–0.2)

## 2022-03-25 LAB — COMPREHENSIVE METABOLIC PANEL
ALT: 26 U/L (ref 0–44)
AST: 26 U/L (ref 15–41)
Albumin: 2.1 g/dL — ABNORMAL LOW (ref 3.5–5.0)
Alkaline Phosphatase: 70 U/L (ref 38–126)
Anion gap: 8 (ref 5–15)
BUN: 47 mg/dL — ABNORMAL HIGH (ref 8–23)
CO2: 19 mmol/L — ABNORMAL LOW (ref 22–32)
Calcium: 8.1 mg/dL — ABNORMAL LOW (ref 8.9–10.3)
Chloride: 104 mmol/L (ref 98–111)
Creatinine, Ser: 2.25 mg/dL — ABNORMAL HIGH (ref 0.44–1.00)
GFR, Estimated: 21 mL/min — ABNORMAL LOW (ref >60)
Glucose, Bld: 328 mg/dL — ABNORMAL HIGH (ref 70–99)
Potassium: 4.7 mmol/L (ref 3.5–5.1)
Sodium: 131 mmol/L — ABNORMAL LOW (ref 135–145)
Total Bilirubin: 0.4 mg/dL (ref 0.3–1.2)
Total Protein: 6.8 g/dL (ref 6.5–8.1)

## 2022-03-25 LAB — C-REACTIVE PROTEIN: CRP: 7.5 mg/dL — ABNORMAL HIGH (ref <1.0)

## 2022-03-25 NOTE — Progress Notes (Signed)
TRH night cross cover note:   I was notified by RN that peripheral access has been lost, and that the patient is currently refusing reestablishment of peripheral axis given anticipation of discharge from the hospital tomorrow. Patient acknowledges that without reestablishment of peripheral IV, that she will also miss her  next dose of solumedrol at 0130 on 8/13.     Babs Bertin, DO Hospitalist

## 2022-03-25 NOTE — Progress Notes (Signed)
  Progress Note   Patient: Donna Green GDJ:[id number] DOB: 1941/05/29 DOA: 03/22/2022     2 DOS: the patient was seen and examined on 03/25/2022   Brief hospital course: 81 y.o. female with medical history significant of DM, HLD, HTN, CKD, and CVA with residual R hemiparesis presenting with AKI.  She reports that she developed her current symptoms about 2 weeks ago (7/28).  She has had nasal congestion, some SOB, cough, and severe diarrhea.  Her diarrhea has resolved.  No n/v.  She was seen by her PCP on 8/8 and diagnosis with UTI and AKI.  She was treated with Cipro.    Her baseline creatinine is in the 1.5-1.7 range and she is followed by nephrology. Pt found to be COVID pos  Assessment and Plan: COVID-19 infection -Patient with presenting with mild SOB, URI symptoms, and prior severe diarrhea (now improved) -She does not have a current or home O2 requirement  -COVID POSITIVE -On steroids given ongoing symptoms, but she is out of the window for remdesivir, Paxlovid -PT/OT consulted with recs for HHPT -Encourage mobilization/ambulation as much as possible -no respiratory symptoms currently. 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 -Last recorded GFR in Epic was 24 in 2020, currently 14 -[facility] records show creatinine 1.51 and GFR 34 in 03/2021 -Cr improving with IVF, Cr down to 2.25 -IV was lost overnight, thus pt did not receive IVF overnight -Still remains dehydrated with dry membranes -Pt now agreeable to restart IV and cont IVF   Possible UTI -UA from clinic 8/9 with 1+ blood, 1+ LE, 2+ protein, moderate bacteria - not overly suggestive of UTI -She was given Cipro in clinic and then given a dose of Rocephin in the ER -Current UA is also not overly concerning but is also post-abx -Cont to monitor without further treatment for now -Urine culture is inconsistent   DM -A1c on 8/8 was 9.5, indicating poor control -May utilize continuous glucose monitoring -Continue glargine -Cover  with moderate-scale SSI   HTN -Continue amlodipine, metoprolol   HLD -Continue Crestor -Her recent LDL was 3, total cholesterol 42 (!)   Anxiety -Continue lorazepam   H/o CVA -Continue Aggrenox  Hyperkalemia -resolved with lokelma -Recheck bmet in AM  Hyponatremia -Repeat bmet in AM       Subjective: States feeling better. Eager to go home  Physical Exam: Vitals:   03/24/22 1715 03/24/22 2135 03/25/22 0553 03/25/22 0857  BP: 126/78 (!) 147/71 (!) 147/94 (!) 140/71  Pulse: 85 98 82 82  Resp: 18   18  Temp: (!) 97.5 F (36.4 C) 98.2 F (36.8 C) 97.9 F (36.6 C) 98.5 F (36.9 C)  TempSrc: Oral Oral Oral Oral  SpO2: 96% 100% 94% 95%  Weight:      Height:       General exam: Conversant, in no acute distress Respiratory system: normal chest rise, clear, no audible wheezing Cardiovascular system: regular rhythm, s1-s2 Gastrointestinal system: Nondistended, nontender, pos BS Central nervous system: No seizures, no tremors Extremities: No cyanosis, no joint deformities Skin: No rashes, no pallor Psychiatry: Affect normal // no auditory hallucinations   Data Reviewed:  Labs reviewed: Na 131, K 4.7, Cr 2.25, Hgb 9.6   Family Communication: Pt in room, family at bedside  Disposition: Status is: Inpatient Remains inpatient appropriate because: Severity of illness  Planned Discharge Destination: Home    Author: Marylu Lund, MD 03/25/2022 4:15 PM  For on call review [email or web address].

## 2022-03-26 ENCOUNTER — Other Ambulatory Visit (HOSPITAL_COMMUNITY): Payer: Self-pay

## 2022-03-26 DIAGNOSIS — F419 Anxiety disorder, unspecified: Secondary | ICD-10-CM | POA: Diagnosis not present

## 2022-03-26 DIAGNOSIS — N189 Chronic kidney disease, unspecified: Secondary | ICD-10-CM | POA: Diagnosis not present

## 2022-03-26 DIAGNOSIS — N179 Acute kidney failure, unspecified: Secondary | ICD-10-CM | POA: Diagnosis not present

## 2022-03-26 LAB — CBC WITH DIFFERENTIAL/PLATELET
Abs Immature Granulocytes: 0.14 10*3/uL — ABNORMAL HIGH (ref 0.00–0.07)
Basophils Absolute: 0 10*3/uL (ref 0.0–0.1)
Basophils Relative: 0 %
Eosinophils Absolute: 0 10*3/uL (ref 0.0–0.5)
Eosinophils Relative: 0 %
HCT: 31.9 % — ABNORMAL LOW (ref 36.0–46.0)
Hemoglobin: 10.5 g/dL — ABNORMAL LOW (ref 12.0–15.0)
Immature Granulocytes: 1 %
Lymphocytes Relative: 7 %
Lymphs Abs: 0.9 10*3/uL (ref 0.7–4.0)
MCH: 33.1 pg (ref 26.0–34.0)
MCHC: 32.9 g/dL (ref 30.0–36.0)
MCV: 100.6 fL — ABNORMAL HIGH (ref 80.0–100.0)
Monocytes Absolute: 0.5 10*3/uL (ref 0.1–1.0)
Monocytes Relative: 4 %
Neutro Abs: 10.8 10*3/uL — ABNORMAL HIGH (ref 1.7–7.7)
Neutrophils Relative %: 88 %
Platelets: 492 10*3/uL — ABNORMAL HIGH (ref 150–400)
RBC: 3.17 MIL/uL — ABNORMAL LOW (ref 3.87–5.11)
RDW: 13.8 % (ref 11.5–15.5)
WBC: 12.2 10*3/uL — ABNORMAL HIGH (ref 4.0–10.5)
nRBC: 0 % (ref 0.0–0.2)

## 2022-03-26 LAB — COMPREHENSIVE METABOLIC PANEL
ALT: 26 U/L (ref 0–44)
AST: 25 U/L (ref 15–41)
Albumin: 2.4 g/dL — ABNORMAL LOW (ref 3.5–5.0)
Alkaline Phosphatase: 74 U/L (ref 38–126)
Anion gap: 9 (ref 5–15)
BUN: 43 mg/dL — ABNORMAL HIGH (ref 8–23)
CO2: 21 mmol/L — ABNORMAL LOW (ref 22–32)
Calcium: 8.5 mg/dL — ABNORMAL LOW (ref 8.9–10.3)
Chloride: 103 mmol/L (ref 98–111)
Creatinine, Ser: 1.98 mg/dL — ABNORMAL HIGH (ref 0.44–1.00)
GFR, Estimated: 25 mL/min — ABNORMAL LOW (ref >60)
Glucose, Bld: 350 mg/dL — ABNORMAL HIGH (ref 70–99)
Potassium: 4.7 mmol/L (ref 3.5–5.1)
Sodium: 133 mmol/L — ABNORMAL LOW (ref 135–145)
Total Bilirubin: 0.3 mg/dL (ref 0.3–1.2)
Total Protein: 7.5 g/dL (ref 6.5–8.1)

## 2022-03-26 LAB — GLUCOSE, CAPILLARY
Glucose-Capillary: 317 mg/dL — ABNORMAL HIGH (ref 70–99)
Glucose-Capillary: 323 mg/dL — ABNORMAL HIGH (ref 70–99)

## 2022-03-26 MED ORDER — PREDNISONE 50 MG PO TABS
50.0000 mg | ORAL_TABLET | Freq: Every day | ORAL | 0 refills | Status: AC
  Filled 2022-03-26: qty 4, 4d supply, fill #0

## 2022-03-26 MED ORDER — LIVING WELL WITH DIABETES BOOK
Freq: Once | Status: AC
  Filled 2022-03-26: qty 1

## 2022-03-26 MED ORDER — ORAL CARE MOUTH RINSE: 15.0000 mL | OROMUCOSAL | Status: DC | PRN

## 2022-03-26 NOTE — TOC Transition Note (Signed)
Transition of Care [facility]) - CM/SW Discharge Note   Patient Details  Name: Donna Green MRN: [id number] Date of Birth: 05/06/1941  Transition of Care [facility]) CM/SW Contact:  Cyndi Bender, RN Phone Number: 03/26/2022, 2:09 PM   Clinical Narrative:     Patient stable for discharge. Home health and DME ordered. Spoke to Daughter Daphane regarding transition needs. Daphane defers to [facility] to find highly rated agency. Spoke to Ronan with [facility] and referral for HH_PT accepted.  Daphane agreeable to use in house provider adapt for 3&1, 3&1 will be delivered to the address on file.  Daughter at bedside to transport home.  Address, Phone number and PCP verified.   Final next level of care: [place] Barriers to Discharge: Barriers Resolved   Patient Goals and CMS Choice Patient states their goals for this hospitalization and ongoing recovery are:: return home CMS Medicare.gov Compare Post Acute Care list provided to:: Patient Represenative (must comment) Choice offered to / list presented to : Adult Children  Discharge Placement                     home  Discharge Plan and Services   Discharge Planning Services: CM Consult Post Acute Care Choice: Home Health, Durable Medical Equipment          DME Arranged: 3-N-1 DME Agency: [facility] Date DME Agency Contacted: 03/26/22 Time DME Agency Contacted: [phone] Representative spoke with at DME Agency: [place]: PT [place]: [place] Date [place]: 03/26/22 Time [place]: 1408 Representative spoke with at [place]: [place] ([employer]) Interventions     Readmission Risk Interventions    03/26/2022    2:08 PM  Readmission Risk Prevention Plan  Transportation Screening Complete  PCP or Specialist Appt within 5-7 Days Complete  Home Care Screening Complete  Medication Review (RN CM) Complete

## 2022-03-26 NOTE — TOC Progression Note (Signed)
Transition of Care [facility]) - Progression Note    Patient Details  Name: Donna Green MRN: [id number] Date of Birth: [birth year]  Transition of Care [facility]) CM/SW [place], RN Phone Number: 03/26/2022, 1:55 PM  Clinical Narrative:     1000 Attempted to get in touch with patient by room and mobile phone with no answer.    1044 Spoke to bedside nurse who recommended reaching out to patient's daughter.     1141 Left VM with Daphane to arranged home health. Awaiting return call.  1357 Left VM with Daphane   Expected Discharge Plan and Services                                                 Social Determinants of Health (SDOH) Interventions    Readmission Risk Interventions     No data to display

## 2022-03-26 NOTE — Inpatient Diabetes Management (Addendum)
Inpatient Diabetes Program Recommendations  AACE/ADA: New Consensus Statement on Inpatient Glycemic Control (2015)  Target Ranges:  Prepandial:   less than 140 mg/dL      Peak postprandial:   less than 180 mg/dL (1-2 hours)      Critically ill patients:  140 - 180 mg/dL   Lab Results  Component Value Date   GLUCAP 317 (H) 03/26/2022   HGBA1C 9.2 (H) 03/23/2022    Review of Glycemic Control  Latest Reference Range & Units 03/25/22 21:01 03/26/22 07:55  Glucose-Capillary 70 - 99 mg/dL 235 (H) 317 (H)  (H): Data is abnormally high  Diabetes history:  DM2 Outpatient Diabetes medications: Lantus 14 units QD, Novolog 4-8 units TID Current orders for Inpatient glycemic control: Semglee 34 units QD, Novolog 0-15 units TID & 0-5 QHS, Solumedrol 41.25 mg Q12H, Prednisone 50 mg QD  Inpatient Diabetes Program Recommendations:    Novolog 4 units TID with meals IF consumes at least 50% Novolog 0-20 units TID and HS while on steroids  Ordered Living Well with Diabetes booklet.  Will see patient regarding A1C of 9.2% (average BG 217 mg/dL).  Will continue to follow while inpatient.  Thank you, Reche Dixon, MSN, [place] Diabetes Coordinator Inpatient Diabetes Program [phone] (team pager from 8a-5p)

## 2022-03-26 NOTE — Discharge Summary (Signed)
Physician Discharge Summary   Patient: Donna Green MRN: [id number] DOB: [birth year]  Admit date:     03/22/2022  Discharge date: 03/26/22  Discharge Physician: Marylu Lund   PCP: Seward Carol, MD   Recommendations at discharge:    Follow up with PCP in 1-2 weeks Recommend repeat BMET in 1 week, focus on renal function  Discharge Diagnoses: Principal Problem:   Acute kidney injury superimposed on chronic kidney disease ([place]) Active Problems:   History of stroke   Essential hypertension   HLD (hyperlipidemia)   Type 2 diabetes mellitus with other circulatory complications ([place])   [id number] virus infection   Anxiety  Resolved Problems:   * No resolved hospital problems. *  Hospital Course: 81 y.o. female with medical history significant of DM, HLD, HTN, CKD, and CVA with residual R hemiparesis presenting with AKI.  She reports that she developed her current symptoms about 2 weeks ago (7/28).  She has had nasal congestion, some SOB, cough, and severe diarrhea.  Her diarrhea has resolved.  No n/v.  She was seen by her PCP on 8/8 and diagnosis with UTI and AKI.  She was treated with Cipro.    Her baseline creatinine is in the 1.5-1.7 range and she is followed by nephrology. Pt found to be COVID pos  Assessment and Plan: COVID-19 infection  -Patient with presenting with mild SOB, URI symptoms, and prior severe diarrhea (now improved) -She does not have a current or home O2 requirement  -COVID POSITIVE -On steroids given ongoing symptoms, but she is out of the window for remdesivir, Paxlovid -PT/OT consulted with recs for HHPT -Encourage mobilization/ambulation as much as possible -no respiratory symptoms currently. 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 Pt tolerating PO  well. Voiding well   Possible UTI -UA from clinic 8/9 with 1+ blood, 1+ LE, 2+ protein, moderate bacteria - not overly suggestive of UTI -She was given Cipro in clinic and then given a dose of Rocephin in the ER -Current UA is also not overly concerning but is also post-abx -remained stable without abx -Urine culture is inconsistent   DM -A1c on 8/8 was 9.5, indicating poor control -May utilize continuous glucose monitoring -Continued glargine -Cover with moderate-scale SSI while in hospital   HTN -Continue amlodipine, metoprolol   HLD -Continue Crestor -Her recent LDL was 3, total cholesterol 42 (!)   Anxiety -Continue lorazepam   H/o CVA -Continue Aggrenox   Hyperkalemia -resolved with lokelma   Hyponatremia -improved with IVF        Consultants:  Procedures performed:   Disposition: Home Diet recommendation:  Cardiac diet DISCHARGE MEDICATION: Allergies as of 03/26/2022       Reactions   Codeine Other (See Comments)   Paranoid  Hallucinations    Colcrys [colchicine] Nausea Only   Zyloprim [allopurinol] Nausea Only   Nsaids Other (See Comments)   Stage 3 kidney disease        Medication List     STOP taking these medications    ciprofloxacin 250 MG tablet Commonly known as: CIPRO   Kerendia 10 MG Tabs Generic drug: Finerenone       TAKE these medications    acetaminophen 325 MG tablet Commonly known as: TYLENOL Take 650 mg by mouth 2 (  two) times daily.   Aggrenox 200-25 MG 12hr capsule Generic drug: dipyridamole-aspirin Take 1 capsule by mouth 2 (two) times daily.   amLODipine 10 MG tablet Commonly known as: NORVASC Take 10 mg by mouth daily.   Delsym 30 MG/5ML liquid Generic drug: dextromethorphan Take 30 mg by mouth daily as needed for cough.   Lantus 100 UNIT/ML injection Generic drug: insulin glargine Inject 14 Units into the skin at bedtime.   LORazepam 1 MG tablet Commonly known as: ATIVAN Take 1 mg by mouth 2  (two) times daily.   metoprolol tartrate 100 MG tablet Commonly known as: LOPRESSOR Take 50-100 mg by mouth See admin instructions. 50 mg in the morning, 100 mg in the evening   NovoLOG FlexPen 100 UNIT/ML FlexPen Generic drug: insulin aspart Inject 8 Units into the skin 3 (three) times daily with meals.   predniSONE 50 MG tablet Commonly known as: DELTASONE Take 1 tablet (50 mg total) by mouth daily with breakfast for 4 days.   rosuvastatin 20 MG tablet Commonly known as: CRESTOR Take 20 mg by mouth daily.   ZIMS MAX-FREEZE EX Apply 1 Application topically 2 (two) times daily as needed (pain).        Follow-up Information     Seward Carol, MD Follow up.   Specialty: Internal Medicine Why: as scheduled Contact information: [address] [place] [address] [phone]                Discharge Exam: Danley Danker Weights   03/22/22 1932  Weight: 82.7 kg   General exam: Awake, laying in bed, in nad Respiratory system: Normal respiratory effort, no wheezing Cardiovascular system: regular rate, s1, s2 Gastrointestinal system: Soft, nondistended, positive BS Central nervous system: CN2-12 grossly intact, strength intact Extremities: Perfused, no clubbing Skin: Normal skin turgor, no notable skin lesions seen Psychiatry: Mood normal // no visual hallucinations   Condition at discharge: fair  The results of significant diagnostics from this hospitalization (including imaging, microbiology, ancillary and laboratory) are listed below for reference.   Imaging Studies: DG Chest Portable 1 View  Result Date: 03/22/2022 CLINICAL DATA:  Weakness EXAM: PORTABLE CHEST 1 VIEW COMPARISON:  03/18/2019 FINDINGS: Heart is normal size. Large hiatal hernia. No confluent airspace opacities or effusions. No acute bony abnormality. Aortic atherosclerosis. IMPRESSION: Large hiatal hernia. No active disease. Electronically Signed   By: Rolm Baptise M.D.   On:  03/22/2022 20:12   CT Renal Stone Study  Result Date: 03/22/2022 CLINICAL DATA:  Flank pain, kidney stone suspected EXAM: CT ABDOMEN AND PELVIS WITHOUT CONTRAST TECHNIQUE: Multidetector CT imaging of the abdomen and pelvis was performed following the standard protocol without IV contrast. RADIATION DOSE REDUCTION: This exam was performed according to the departmental dose-optimization program which includes automated exposure control, adjustment of the mA and/or kV according to patient size and/or use of iterative reconstruction technique. COMPARISON:  None Available. FINDINGS: Lower chest: Large hiatal hernia. Heart is normal size. No acute abnormality. Hepatobiliary: Gallbladder is mildly distended. No visible stones or biliary ductal dilatation. No focal hepatic abnormality. Pancreas: No focal abnormality or ductal dilatation. Spleen: No focal abnormality.  Normal size. Adrenals/Urinary Tract: 2 cm low-density lesion in the midpole of the left kidney. 1.5 cm low-density lesion in the lower pole of the left kidney. These are difficult to characterize on this noncontrast study. 3 cm low-density lesion off the lower pole of the right kidney which appears to be cystic. Other smaller low-density areas in the right  kidney. No stones or hydronephrosis. Adrenal glands and urinary bladder unremarkable. Stomach/Bowel: Sigmoid diverticulosis. No active diverticulitis. Stomach and small bowel decompressed, unremarkable. Normal appendix Vascular/Lymphatic: No evidence of aneurysm or adenopathy. Aortoiliac atherosclerosis. Reproductive: Prior hysterectomy.  No adnexal masses. Other: No free fluid or free air. Musculoskeletal: Mild compression fracture through the inferior endplate of L2. Advanced degenerative changes throughout the lumbar spine most pronounced at the lumbosacral junction. Grade 1 anterolisthesis of L5 on S1. IMPRESSION: Mildly distended gallbladder. No visible stones. If there is clinical concern for  cholelithiasis or cholecystitis, recommend further evaluation with right upper quadrant ultrasound. Low-density lesions within both kidneys, some which are difficult to characterize on this noncontrast study. These could be further characterized with elective renal ultrasound if felt clinically indicated. Large hiatal hernia. Aortic atherosclerosis. Sigmoid diverticulosis. Electronically Signed   By: Rolm Baptise M.D.   On: 03/22/2022 20:11    Microbiology: Results for orders placed or performed during the hospital encounter of 03/22/22  SARS Coronavirus 2 by RT PCR (hospital order, performed in [facility] hospital lab) *cepheid single result test* Anterior Nasal Swab     Status: Abnormal   Collection Time: 03/22/22  8:00 PM   Specimen: Anterior Nasal Swab  Result Value Ref Range Status   SARS Coronavirus 2 by RT PCR POSITIVE (A) NEGATIVE Final    Comment: (NOTE) SARS-CoV-2 target nucleic acids are DETECTED  SARS-CoV-2 RNA is generally detectable in upper respiratory specimens  during the acute phase of infection.  Positive results are indicative  of the presence of the identified virus, but do not rule out bacterial infection or co-infection with other pathogens not detected by the test.  Clinical correlation with patient history and  other diagnostic information is necessary to determine patient infection status.  The expected result is negative.  Fact Sheet for Patients:   https://www.patel.info/   Fact Sheet for Healthcare Providers:   https://hall.com/    This test is not yet approved or cleared by the [place] FDA and  has been authorized for detection and/or diagnosis of SARS-CoV-2 by FDA under an Emergency Use Authorization (EUA).  This EUA will remain in effect (meaning this test can be used) for the duration of  the COVID-19 declaration under Section 564(b)(1)  of the Act, 21 U.S.C. section 360-bbb-3(b)(1), unless the  authorization is terminated or revoked sooner.   Performed at [facility], [address] [place], [place] [address]   Urine Culture     Status: Abnormal   Collection Time: 03/22/22 11:04 PM   Specimen: Urine, Clean Catch  Result Value Ref Range Status   Specimen Description   Final    URINE, CLEAN CATCH Performed at [facility], [address], [place], [place] [address]    Special Requests   Final    NONE Performed at [facility], [address], [place], [place] [address]    Culture MULTIPLE SPECIES PRESENT, SUGGEST RECOLLECTION (A)  Final   Report Status 03/24/2022 FINAL  Final    Labs: CBC: Recent Labs  Lab 03/22/22 2000 03/24/22 0355 03/25/22 0240 03/26/22 1120  WBC 12.2* 10.3 15.3* 12.2*  NEUTROABS 9.6* 9.4* 13.3* 10.8*  HGB 11.3* 10.8* 9.6* 10.5*  HCT 34.6* 32.5* 28.9* 31.9*  MCV 100.9* 101.2* 100.3* 100.6*  PLT 519* 452* 456* 947*   Basic Metabolic Panel: Recent Labs  Lab 03/22/22 2000 03/24/22 0355 03/25/22 0240 03/26/22 0804  NA 134* 133* 131* 133*  K 4.2 5.7* 4.7 4.7  CL  104 106 104 103  CO2 17* 18* 19* 21*  GLUCOSE 156* 387* 328* 350*  BUN 72* 53* 47* 43*  CREATININE 3.24* 2.54* 2.25* 1.98*  CALCIUM 9.1 8.4* 8.1* 8.5*  PHOS 3.0  --   --   --    Liver Function Tests: Recent Labs  Lab 03/22/22 2000 03/24/22 0355 03/25/22 0240 03/26/22 0804  AST  --  35 26 25  ALT  --  32 26 26  ALKPHOS  --  91 70 74  BILITOT  --  0.4 0.4 0.3  PROT  --  7.3 6.8 7.5  ALBUMIN 3.4* 2.1* 2.1* 2.4*   CBG: Recent Labs  Lab 03/25/22 1147 03/25/22 1725 03/25/22 2101 03/26/22 0755 03/26/22 1223  GLUCAP 195* 160* 235* 317* 323*    Discharge time spent: less than 30 minutes.  Signed: Marylu Lund, MD [facility] 03/26/2022

## 2022-03-26 NOTE — Progress Notes (Signed)
DISCHARGE NOTE HOME [name] to be discharged Home per MD order. Discussed prescriptions and follow up appointments with the patient. Prescriptions given to patient; medication list explained in detail. Patient verbalized understanding.  Skin clean, dry and intact without evidence of skin break down, no evidence of skin tears noted. IV catheter discontinued intact. Site without signs and symptoms of complications. Dressing and pressure applied. Pt denies pain at the site currently. No complaints noted.  Patient free of lines, drains, and wounds.   An After Visit Summary (AVS) was printed and given to the patient. Patient escorted via wheelchair, and discharged home via private auto.  Donna Green S Keylen Uzelac, RN

## 2022-04-03 DIAGNOSIS — E1122 Type 2 diabetes mellitus with diabetic chronic kidney disease: Secondary | ICD-10-CM | POA: Diagnosis not present

## 2022-04-03 DIAGNOSIS — E1140 Type 2 diabetes mellitus with diabetic neuropathy, unspecified: Secondary | ICD-10-CM | POA: Diagnosis not present

## 2022-04-03 DIAGNOSIS — Z8616 Personal history of COVID-19: Secondary | ICD-10-CM | POA: Diagnosis not present

## 2022-04-03 DIAGNOSIS — I10 Essential (primary) hypertension: Secondary | ICD-10-CM | POA: Diagnosis not present

## 2022-04-03 DIAGNOSIS — F411 Generalized anxiety disorder: Secondary | ICD-10-CM | POA: Diagnosis not present

## 2022-04-03 DIAGNOSIS — N179 Acute kidney failure, unspecified: Secondary | ICD-10-CM | POA: Diagnosis not present

## 2022-04-03 DIAGNOSIS — N1832 Chronic kidney disease, stage 3b: Secondary | ICD-10-CM | POA: Diagnosis not present

## 2022-04-30 DIAGNOSIS — E1122 Type 2 diabetes mellitus with diabetic chronic kidney disease: Secondary | ICD-10-CM | POA: Diagnosis not present

## 2022-04-30 DIAGNOSIS — R809 Proteinuria, unspecified: Secondary | ICD-10-CM | POA: Diagnosis not present

## 2022-04-30 DIAGNOSIS — D631 Anemia in chronic kidney disease: Secondary | ICD-10-CM | POA: Diagnosis not present

## 2022-04-30 DIAGNOSIS — N183 Chronic kidney disease, stage 3 unspecified: Secondary | ICD-10-CM | POA: Diagnosis not present

## 2022-04-30 DIAGNOSIS — I129 Hypertensive chronic kidney disease with stage 1 through stage 4 chronic kidney disease, or unspecified chronic kidney disease: Secondary | ICD-10-CM | POA: Diagnosis not present

## 2022-04-30 DIAGNOSIS — R609 Edema, unspecified: Secondary | ICD-10-CM | POA: Diagnosis not present

## 2022-04-30 DIAGNOSIS — N2581 Secondary hyperparathyroidism of renal origin: Secondary | ICD-10-CM | POA: Diagnosis not present

## 2022-05-17 DIAGNOSIS — N183 Chronic kidney disease, stage 3 unspecified: Secondary | ICD-10-CM | POA: Diagnosis not present

## 2022-05-29 DIAGNOSIS — N183 Chronic kidney disease, stage 3 unspecified: Secondary | ICD-10-CM | POA: Diagnosis not present

## 2022-06-26 DIAGNOSIS — I10 Essential (primary) hypertension: Secondary | ICD-10-CM | POA: Diagnosis not present

## 2022-06-26 DIAGNOSIS — E11319 Type 2 diabetes mellitus with unspecified diabetic retinopathy without macular edema: Secondary | ICD-10-CM | POA: Diagnosis not present

## 2022-06-26 DIAGNOSIS — N2581 Secondary hyperparathyroidism of renal origin: Secondary | ICD-10-CM | POA: Diagnosis not present

## 2022-06-26 DIAGNOSIS — M810 Age-related osteoporosis without current pathological fracture: Secondary | ICD-10-CM | POA: Diagnosis not present

## 2022-06-26 DIAGNOSIS — E7800 Pure hypercholesterolemia, unspecified: Secondary | ICD-10-CM | POA: Diagnosis not present

## 2022-06-26 DIAGNOSIS — E1140 Type 2 diabetes mellitus with diabetic neuropathy, unspecified: Secondary | ICD-10-CM | POA: Diagnosis not present

## 2022-06-26 DIAGNOSIS — N1832 Chronic kidney disease, stage 3b: Secondary | ICD-10-CM | POA: Diagnosis not present

## 2022-06-26 DIAGNOSIS — F411 Generalized anxiety disorder: Secondary | ICD-10-CM | POA: Diagnosis not present

## 2022-06-26 DIAGNOSIS — Z23 Encounter for immunization: Secondary | ICD-10-CM | POA: Diagnosis not present

## 2022-06-29 DIAGNOSIS — E11319 Type 2 diabetes mellitus with unspecified diabetic retinopathy without macular edema: Secondary | ICD-10-CM | POA: Diagnosis not present

## 2022-06-29 DIAGNOSIS — E1122 Type 2 diabetes mellitus with diabetic chronic kidney disease: Secondary | ICD-10-CM | POA: Diagnosis not present

## 2022-09-11 ENCOUNTER — Telehealth: Payer: Self-pay | Admitting: Hematology and Oncology

## 2022-09-11 NOTE — Telephone Encounter (Signed)
Pt's daughter called to sch appt from November referral. Scheduled appt per 11/16 referral. Pt's daughter is aware of appt date and time. Pt's daughter is aware to arrive 15 mins prior to appt time and to bring and updated insurance card. Pt's daughter is aware of appt location.

## 2022-09-20 DIAGNOSIS — N183 Chronic kidney disease, stage 3 unspecified: Secondary | ICD-10-CM | POA: Diagnosis not present

## 2022-09-20 DIAGNOSIS — R809 Proteinuria, unspecified: Secondary | ICD-10-CM | POA: Diagnosis not present

## 2022-09-20 DIAGNOSIS — N2581 Secondary hyperparathyroidism of renal origin: Secondary | ICD-10-CM | POA: Diagnosis not present

## 2022-09-20 DIAGNOSIS — D631 Anemia in chronic kidney disease: Secondary | ICD-10-CM | POA: Diagnosis not present

## 2022-09-20 DIAGNOSIS — E1122 Type 2 diabetes mellitus with diabetic chronic kidney disease: Secondary | ICD-10-CM | POA: Diagnosis not present

## 2022-09-20 DIAGNOSIS — I129 Hypertensive chronic kidney disease with stage 1 through stage 4 chronic kidney disease, or unspecified chronic kidney disease: Secondary | ICD-10-CM | POA: Diagnosis not present

## 2022-10-01 ENCOUNTER — Inpatient Hospital Stay: Payer: Medicare PPO | Attending: Hematology and Oncology | Admitting: Hematology and Oncology

## 2022-10-01 ENCOUNTER — Inpatient Hospital Stay: Payer: Medicare PPO

## 2022-10-01 ENCOUNTER — Telehealth: Payer: Self-pay | Admitting: Hematology

## 2022-10-01 NOTE — Telephone Encounter (Signed)
Pt's daughter called in to r/s her mother's new hem appt. Appt no showed due to less than 24 hr notice. R/s appt, pt's daughter is aware.

## 2022-10-02 ENCOUNTER — Telehealth: Payer: Self-pay | Admitting: *Deleted

## 2022-10-02 NOTE — Progress Notes (Signed)
  Care Coordination  Outreach Note  10/02/2022 Name: Donna Green MRN: [id number] DOB: 03/22/1941   Care Coordination Outreach Attempts: An unsuccessful telephone outreach was attempted today to offer the patient information about available care coordination services as a benefit of their health plan.   Follow Up Plan:  Additional outreach attempts will be made to offer the patient care coordination information and services.   Encounter Outcome:  No Answer  [place]  Direct Dial: [phone]

## 2022-10-08 NOTE — Progress Notes (Unsigned)
  Care Coordination  Outreach Note  10/08/2022 Name: Donna Green MRN: [id number] DOB: 03-31-1941   Care Coordination Outreach Attempts: A second unsuccessful outreach was attempted today to offer the patient with information about available care coordination services as a benefit of their health plan.     Follow Up Plan:  Additional outreach attempts will be made to offer the patient care coordination information and services.   Encounter Outcome:  No Answer  [place]  Direct Dial: [phone]

## 2022-10-09 NOTE — Progress Notes (Signed)
  Care Coordination  Outreach Note  10/09/2022 Name: RANEA RENEGAR MRN: [id number] DOB: [birth year]   Care Coordination Outreach Attempts: A third unsuccessful outreach was attempted today to offer the patient with information about available care coordination services as a benefit of their health plan.   Follow Up Plan:  No further outreach attempts will be made at this time. We have been unable to contact the patient to offer or enroll patient in care coordination services  Encounter Outcome:  No Answer  [place]: [phone]

## 2022-10-12 NOTE — Progress Notes (Signed)
HEMATOLOGY/ONCOLOGY CONSULTATION NOTE  Date of Service: 10/12/2022  Patient Care Team: Seward Carol, MD as PCP - General (Internal Medicine) Estanislado Emms, MD (Inactive) as Referring Physician (Nephrology)  CHIEF COMPLAINTS/PURPOSE OF CONSULTATION:  Evaluation and management of monoclonal gammopathy of unknown significance  HISTORY OF PRESENTING ILLNESS:  Donna Green is a wonderful 82 y.o. female who has been referred to [place] by Seward Carol, MD for evaluation and management of monoclonal gammopathy of unknown significance. She was last seen by Dr. Alen Blew in 2015.  Today, she is accompanied by her daughter. She reports that she has not had any new bone pain, loss of appetite, leg swelling, abdominal pain, sleep disturbances. She does have some middle back pain, which is unchanged from baseline.  She complains that her hearing loss has worsened and she is planning on getting hearing aids. She also complains of neopathy/pain in her bilateral legs, but denies any major changes in last 6 months-1 year.   She denies any recent falls and sometimes uses a cane. She denies any recent major medication changes or unexpected weight loss. She reports that she previously could not tolerate Lyrica as it caused her to stumble. She did have bone density testing a few years ago.  MEDICAL HISTORY:  Past Medical History:  Diagnosis Date   Anxiety 03/23/2022   Chronic kidney disease    Diabetes mellitus without complication (HCC)    Headache    Hyperlipidemia    Hypertension    Right hemiparesis (HCC)    Small bowel obstruction ([place])    Stroke ([place])    x2    SURGICAL HISTORY: Past Surgical History:  Procedure Laterality Date   BLADDER SURGERY     BOWEL RESECTION     EYE SURGERY     TUBAL LIGATION      SOCIAL HISTORY: Social History   Socioeconomic History   Marital status: Widowed    Spouse name: Not on file   Number of children: 2   Years of education: Not on file   Highest  education level: Not on file  Occupational History   Occupation: retired   Tobacco Use   Smoking status: Never   Smokeless tobacco: Never  Substance and Sexual Activity   Alcohol use: No   Drug use: No   Sexual activity: Not on file  Other Topics Concern   Not on file  Social History Narrative   Live with Daughter       1- 2 times a week    Social Determinants of Health   Financial Resource Strain: Not on file  Food Insecurity: Not on file  Transportation Needs: Not on file  Physical Activity: Not on file  Stress: Not on file  Social Connections: Not on file  Intimate Partner Violence: Not on file    FAMILY HISTORY: Family History  Problem Relation Age of Onset   Diabetes Mother    Heart disease Mother    Parkinsonism Father    Diabetes Sister    Diabetes Brother    Cancer Brother    Diabetes Maternal Grandmother     ALLERGIES:  is allergic to codeine, colcrys [colchicine], zyloprim [allopurinol], and nsaids.  MEDICATIONS:  Current Outpatient Medications  Medication Sig Dispense Refill   acetaminophen (TYLENOL) 325 MG tablet Take 650 mg by mouth 2 (two) times daily.     amLODipine (NORVASC) 10 MG tablet Take 10 mg by mouth daily.     dextromethorphan (DELSYM) 30 MG/5ML liquid Take 30  mg by mouth daily as needed for cough.     dipyridamole-aspirin (AGGRENOX) 200-25 MG per 12 hr capsule Take 1 capsule by mouth 2 (two) times daily.     insulin aspart (NOVOLOG FLEXPEN) 100 UNIT/ML FlexPen Inject 8 Units into the skin 3 (three) times daily with meals.     insulin glargine (LANTUS) 100 UNIT/ML injection Inject 14 Units into the skin at bedtime.     LORazepam (ATIVAN) 1 MG tablet Take 1 mg by mouth 2 (two) times daily.     Menthol, Topical Analgesic, (ZIMS MAX-FREEZE EX) Apply 1 Application topically 2 (two) times daily as needed (pain).     metoprolol (LOPRESSOR) 100 MG tablet Take 50-100 mg by mouth See admin instructions. 50 mg in the morning, 100 mg in the evening      rosuvastatin (CRESTOR) 20 MG tablet Take 20 mg by mouth daily.     No current facility-administered medications for this visit.    REVIEW OF SYSTEMS:    10 Point review of Systems was done is negative except as noted above.  PHYSICAL EXAMINATION: ECOG PERFORMANCE STATUS: 1 - Symptomatic but completely ambulatory  . Vitals:   10/13/22 1045  BP: (!) 207/97  Pulse: 73  Resp: 16  Temp: 98.2 F (36.8 C)  SpO2: 99%   GENERAL:alert, in no acute distress and comfortable SKIN: no acute rashes, no significant lesions EYES: conjunctiva are pink and non-injected, sclera anicteric OROPHARYNX: MMM, no exudates, no oropharyngeal erythema or ulceration NECK: supple, no JVD LYMPH:  no palpable lymphadenopathy in the cervical, axillary or inguinal regions LUNGS: clear to auscultation b/l with normal respiratory effort HEART: regular rate & rhythm ABDOMEN:  normoactive bowel sounds , non tender, not distended. Extremity: no pedal edema PSYCH: alert & oriented x 3 with fluent speech NEURO: no focal motor/sensory deficits  LABORATORY DATA:  I have reviewed the data as listed .    Latest Ref Rng & Units 10/13/2022   12:52 PM 03/26/2022   11:20 AM 03/25/2022    2:40 AM  CBC  WBC 4.0 - 10.5 K/uL 5.3  12.2  15.3   Hemoglobin 12.0 - 15.0 g/dL 12.4  10.5  9.6   Hematocrit 36.0 - 46.0 % 37.4  31.9  28.9   Platelets 150 - 400 K/uL 207  492  456    .    Latest Ref Rng & Units 10/13/2022   12:52 PM 03/26/2022    8:04 AM 03/25/2022    2:40 AM  CMP  Glucose 70 - 99 mg/dL 80  350  328   BUN 8 - 23 mg/dL 27  43  47   Creatinine 0.44 - 1.00 mg/dL 1.70  1.98  2.25   Sodium 135 - 145 mmol/L 144  133  131   Potassium 3.5 - 5.1 mmol/L 3.7  4.7  4.7   Chloride 98 - 111 mmol/L 110  103  104   CO2 22 - 32 mmol/L '24  21  19   '$ Calcium 8.9 - 10.3 mg/dL 9.6  8.5  8.1   Total Protein 6.5 - 8.1 g/dL 7.8  7.5  6.8   Total Bilirubin 0.3 - 1.2 mg/dL 0.5  0.3  0.4   Alkaline Phos 38 - 126 U/L 48  74  70    AST 15 - 41 U/L 33  25  26   ALT 0 - 44 U/L '29  26  26     '$ RADIOGRAPHIC STUDIES: I have personally reviewed the  radiological images as listed and agreed with the findings in the report. No results found.  ASSESSMENT & PLAN:   Wonderful 82 y/o female with:  1. MGUS (monoclonal gammopathy of unknown significance 2. Stage 3b chronic kidney diseasee 3. Diabetic retinopathy 4. Secondary hyperparathyroidism of renal origin 5. age-related osteoporosis, unspecified pathological fracture presence 6. Essential (primary) hypertension 7. Anxiety 8. Pure hypercholesterolemia 9. Type 2 diabetes mellitus with diabetic chronic kidney disease 9. Type 2 diabetes mellitus with diabetic neuropathy, unspecified)  PLAN: -Last labs from November 2023 revealed Hgb of 11.3, WBC and platelets normal -Educated patient on MGUS - abnormal protein in the blood was an abnormal antibody. This has been monitored over time -If MGUS progresses, there would be concern for multiple myeloma. Changes of any progression in only 1-2% a year -Patient has chronic kidney disease, which has been stable and likely not related to bone marrow issue -Educated patient that sometimes patient with MGUS have elevated calcium levels, though this is not a concern at this time -Recommend PEA once a day for 1-2 months, 600 MG a day to improve neuropathy -Discussed option of CBD oil/gummies. Topical option would be recommended. -Patient has no new symptoms at this time -order blood test to ensure abnormal protein has been stable -Based on labs, may recommend PCP to monitor once a year  FOLLOW-UP: Labs today  Phone visit with Dr Irene Limbo in 7-10 days  The total time spent in the appointment was 47 minutes* .  All of the patient's questions were answered with apparent satisfaction. The patient knows to call the clinic with any problems, questions or concerns.   Sullivan Lone MD MS AAHIVMS [facility] Hematology/Oncology Physician [facility]  .*Total Encounter Time as defined by the Centers for Medicare and Medicaid Services includes, in addition to the face-to-face time of a patient visit (documented in the note above) non-face-to-face time: obtaining and reviewing outside history, ordering and reviewing medications, tests or procedures, care coordination (communications with other health care professionals or caregivers) and documentation in the medical record.   I,Mitra Faeizi,acting as a Education administrator for Sullivan Lone, MD.,have documented all relevant documentation on the behalf of Sullivan Lone, MD,as directed by  Sullivan Lone, MD while in the presence of Sullivan Lone, MD.  .I have reviewed the above documentation for accuracy and completeness, and I agree with the above. Brunetta Genera MD

## 2022-10-13 ENCOUNTER — Inpatient Hospital Stay: Payer: Medicare PPO

## 2022-10-13 ENCOUNTER — Inpatient Hospital Stay: Payer: Medicare PPO | Attending: Hematology and Oncology | Admitting: Hematology

## 2022-10-13 VITALS — BP 207/97 | HR 73 | Temp 98.2°F | Resp 16

## 2022-10-13 DIAGNOSIS — D472 Monoclonal gammopathy: Secondary | ICD-10-CM

## 2022-10-13 DIAGNOSIS — E1122 Type 2 diabetes mellitus with diabetic chronic kidney disease: Secondary | ICD-10-CM | POA: Diagnosis not present

## 2022-10-13 DIAGNOSIS — M810 Age-related osteoporosis without current pathological fracture: Secondary | ICD-10-CM | POA: Diagnosis not present

## 2022-10-13 DIAGNOSIS — I129 Hypertensive chronic kidney disease with stage 1 through stage 4 chronic kidney disease, or unspecified chronic kidney disease: Secondary | ICD-10-CM | POA: Diagnosis not present

## 2022-10-13 DIAGNOSIS — N2581 Secondary hyperparathyroidism of renal origin: Secondary | ICD-10-CM | POA: Diagnosis not present

## 2022-10-13 DIAGNOSIS — N189 Chronic kidney disease, unspecified: Secondary | ICD-10-CM | POA: Insufficient documentation

## 2022-10-13 LAB — CBC WITH DIFFERENTIAL (CANCER CENTER ONLY)
Abs Immature Granulocytes: 0.03 10*3/uL (ref 0.00–0.07)
Basophils Absolute: 0 10*3/uL (ref 0.0–0.1)
Basophils Relative: 1 %
Eosinophils Absolute: 0.2 10*3/uL (ref 0.0–0.5)
Eosinophils Relative: 3 %
HCT: 37.4 % (ref 36.0–46.0)
Hemoglobin: 12.4 g/dL (ref 12.0–15.0)
Immature Granulocytes: 1 %
Lymphocytes Relative: 45 %
Lymphs Abs: 2.4 10*3/uL (ref 0.7–4.0)
MCH: 33.3 pg (ref 26.0–34.0)
MCHC: 33.2 g/dL (ref 30.0–36.0)
MCV: 100.5 fL — ABNORMAL HIGH (ref 80.0–100.0)
Monocytes Absolute: 0.7 10*3/uL (ref 0.1–1.0)
Monocytes Relative: 13 %
Neutro Abs: 1.9 10*3/uL (ref 1.7–7.7)
Neutrophils Relative %: 37 %
Platelet Count: 207 10*3/uL (ref 150–400)
RBC: 3.72 MIL/uL — ABNORMAL LOW (ref 3.87–5.11)
RDW: 14.5 % (ref 11.5–15.5)
WBC Count: 5.3 10*3/uL (ref 4.0–10.5)
nRBC: 0 % (ref 0.0–0.2)

## 2022-10-13 LAB — CMP (CANCER CENTER ONLY)
ALT: 29 U/L (ref 0–44)
AST: 33 U/L (ref 15–41)
Albumin: 4.4 g/dL (ref 3.5–5.0)
Alkaline Phosphatase: 48 U/L (ref 38–126)
Anion gap: 10 (ref 5–15)
BUN: 27 mg/dL — ABNORMAL HIGH (ref 8–23)
CO2: 24 mmol/L (ref 22–32)
Calcium: 9.6 mg/dL (ref 8.9–10.3)
Chloride: 110 mmol/L (ref 98–111)
Creatinine: 1.7 mg/dL — ABNORMAL HIGH (ref 0.44–1.00)
GFR, Estimated: 30 mL/min — ABNORMAL LOW (ref >60)
Glucose, Bld: 80 mg/dL (ref 70–99)
Potassium: 3.7 mmol/L (ref 3.5–5.1)
Sodium: 144 mmol/L (ref 135–145)
Total Bilirubin: 0.5 mg/dL (ref 0.3–1.2)
Total Protein: 7.8 g/dL (ref 6.5–8.1)

## 2022-10-13 LAB — VITAMIN B12: Vitamin B-12: 309 pg/mL (ref 180–914)

## 2022-10-15 LAB — KAPPA/LAMBDA LIGHT CHAINS
Kappa free light chain: 56.3 mg/L — ABNORMAL HIGH (ref 3.3–19.4)
Kappa, lambda light chain ratio: 2.59 — ABNORMAL HIGH (ref 0.26–1.65)
Lambda free light chains: 21.7 mg/L (ref 5.7–26.3)

## 2022-10-17 LAB — MULTIPLE MYELOMA PANEL, SERUM
Albumin SerPl Elph-Mcnc: 4 g/dL (ref 2.9–4.4)
Albumin/Glob SerPl: 1.3 (ref 0.7–1.7)
Alpha 1: 0.2 g/dL (ref 0.0–0.4)
Alpha2 Glob SerPl Elph-Mcnc: 0.6 g/dL (ref 0.4–1.0)
B-Globulin SerPl Elph-Mcnc: 1.1 g/dL (ref 0.7–1.3)
Gamma Glob SerPl Elph-Mcnc: 1.3 g/dL (ref 0.4–1.8)
Globulin, Total: 3.2 g/dL (ref 2.2–3.9)
IgA: 430 mg/dL — ABNORMAL HIGH (ref 64–422)
IgG (Immunoglobin G), Serum: 1287 mg/dL (ref 586–1602)
IgM (Immunoglobulin M), Srm: 101 mg/dL (ref 26–217)
Total Protein ELP: 7.2 g/dL (ref 6.0–8.5)

## 2022-10-29 ENCOUNTER — Inpatient Hospital Stay (HOSPITAL_BASED_OUTPATIENT_CLINIC_OR_DEPARTMENT_OTHER): Payer: Medicare PPO | Admitting: Hematology

## 2022-10-29 DIAGNOSIS — D472 Monoclonal gammopathy: Secondary | ICD-10-CM

## 2022-10-29 DIAGNOSIS — N2581 Secondary hyperparathyroidism of renal origin: Secondary | ICD-10-CM | POA: Diagnosis not present

## 2022-10-29 DIAGNOSIS — E1122 Type 2 diabetes mellitus with diabetic chronic kidney disease: Secondary | ICD-10-CM | POA: Diagnosis not present

## 2022-10-29 DIAGNOSIS — M810 Age-related osteoporosis without current pathological fracture: Secondary | ICD-10-CM | POA: Diagnosis not present

## 2022-10-29 DIAGNOSIS — I129 Hypertensive chronic kidney disease with stage 1 through stage 4 chronic kidney disease, or unspecified chronic kidney disease: Secondary | ICD-10-CM | POA: Diagnosis not present

## 2022-10-29 DIAGNOSIS — N189 Chronic kidney disease, unspecified: Secondary | ICD-10-CM | POA: Diagnosis not present

## 2022-10-29 NOTE — Progress Notes (Signed)
HEMATOLOGY/ONCOLOGY CLINIC NOTE  Date of Service: 10/29/2022  Patient Care Team: Seward Carol, MD as PCP - General (Internal Medicine) Estanislado Emms, MD as Referring Physician (Nephrology)  CHIEF COMPLAINTS/PURPOSE OF CONSULTATION:  Evaluation and management of monoclonal gammopathy of unknown significance  HISTORY OF PRESENTING ILLNESS:  Donna Green is a wonderful 82 y.o. female who has been referred to [place] by Seward Carol, MD for evaluation and management of monoclonal gammopathy of unknown significance. She was last seen by Dr. Alen Blew in 2015.  Today, she is accompanied by her daughter. She reports that she has not had any new bone pain, loss of appetite, leg swelling, abdominal pain, sleep disturbances. She does have some middle back pain, which is unchanged from baseline.  She complains that her hearing loss has worsened and she is planning on getting hearing aids. She also complains of neopathy/pain in her bilateral legs, but denies any major changes in last 6 months-1 year.   She denies any recent falls and sometimes uses a cane. She denies any recent major medication changes or unexpected weight loss. She reports that she previously could not tolerate Lyrica as it caused her to stumble. She did have bone density testing a few years ago.  INTERVAL HISTORY: Donna Green is a wonderful 82 y.o. female who is contacted via phone for continued evaluation and management of monoclonal gammopathy of unknown significance.   .I connected with Donna Green on 10/29/2022 at  3:30 PM EDT by telephone visit and verified that I am speaking with the correct person using two identifiers.   Patient was last seen by me on 10/12/2022 and she was doing well overall except mild middle back pain and hearing loss.   Patient reports she has been doing well overall without any new medical concerns since our last visit. She denies fever, chills, night sweats, infection issues, abnormal  bleeding, abdominal pain, back pain, chest pain, or leg swelling.   I discussed the limitations, risks, security and privacy concerns of performing an evaluation and management service by telemedicine and the availability of in-person appointments. I also discussed with the patient that there may be a patient responsible charge related to this service. The patient expressed understanding and agreed to proceed.   Other persons participating in the visit and their role in the encounter: Family member.    Patient's location: Home  Provider's location: [facility]   Chief Complaint: MGUS    MEDICAL HISTORY:  Past Medical History:  Diagnosis Date   Anxiety 03/23/2022   Chronic kidney disease    Diabetes mellitus without complication (HCC)    Headache    Hyperlipidemia    Hypertension    Right hemiparesis (HCC)    Small bowel obstruction ([place])    Stroke ([place])    x2    SURGICAL HISTORY: Past Surgical History:  Procedure Laterality Date   BLADDER SURGERY     BOWEL RESECTION     EYE SURGERY     TUBAL LIGATION      SOCIAL HISTORY: Social History   Socioeconomic History   Marital status: Widowed    Spouse name: Not on file   Number of children: 2   Years of education: Not on file   Highest education level: Not on file  Occupational History   Occupation: retired   Tobacco Use   Smoking status: Never   Smokeless tobacco: Never  Substance and Sexual Activity   Alcohol use: No   Drug use: No  Sexual activity: Not on file  Other Topics Concern   Not on file  Social History Narrative   Live with Daughter       1- 2 times a week    Social Determinants of Health   Financial Resource Strain: Not on file  Food Insecurity: No Food Insecurity (10/13/2022)   Hunger Vital Sign    Worried About Running Out of Food in the Last Year: Never true    Ran Out of Food in the Last Year: Never true  Transportation Needs: No Transportation Needs (10/13/2022)   PRAPARE - Civil engineer, contracting (Medical): No    Lack of Transportation (Non-Medical): No  Physical Activity: Not on file  Stress: Not on file  Social Connections: Not on file  Intimate Partner Violence: Not At Risk (10/13/2022)   Humiliation, Afraid, Rape, and Kick questionnaire    Fear of Current or Ex-Partner: No    Emotionally Abused: No    Physically Abused: No    Sexually Abused: No    FAMILY HISTORY: Family History  Problem Relation Age of Onset   Diabetes Mother    Heart disease Mother    Parkinsonism Father    Diabetes Sister    Diabetes Brother    Cancer Brother    Diabetes Maternal Grandmother     ALLERGIES:  is allergic to codeine, colcrys [colchicine], zyloprim [allopurinol], and nsaids.  MEDICATIONS:  Current Outpatient Medications  Medication Sig Dispense Refill   acetaminophen (TYLENOL) 325 MG tablet Take 650 mg by mouth 2 (two) times daily.     amLODipine (NORVASC) 10 MG tablet Take 10 mg by mouth daily.     dextromethorphan (DELSYM) 30 MG/5ML liquid Take 30 mg by mouth daily as needed for cough. (Patient not taking: Reported on 10/13/2022)     dipyridamole-aspirin (AGGRENOX) 200-25 MG per 12 hr capsule Take 1 capsule by mouth 2 (two) times daily.     insulin aspart (NOVOLOG FLEXPEN) 100 UNIT/ML FlexPen Inject 8 Units into the skin 3 (three) times daily with meals.     insulin glargine (LANTUS) 100 UNIT/ML injection Inject 14 Units into the skin at bedtime.     LORazepam (ATIVAN) 1 MG tablet Take 1 mg by mouth 2 (two) times daily.     Menthol, Topical Analgesic, (ZIMS MAX-FREEZE EX) Apply 1 Application topically 2 (two) times daily as needed (pain). (Patient not taking: Reported on 10/13/2022)     metoprolol (LOPRESSOR) 100 MG tablet Take 50-100 mg by mouth See admin instructions. 50 mg in the morning, 100 mg in the evening     rosuvastatin (CRESTOR) 20 MG tablet Take 20 mg by mouth daily.     No current facility-administered medications for this visit.    REVIEW OF SYSTEMS:     10 Point review of Systems was done is negative except as noted above.  PHYSICAL EXAMINATION: ECOG PERFORMANCE STATUS: 1 - Symptomatic but completely ambulatory  . There were no vitals filed for this visit.  GENERAL:alert, in no acute distress and comfortable SKIN: no acute rashes, no significant lesions EYES: conjunctiva are pink and non-injected, sclera anicteric OROPHARYNX: MMM, no exudates, no oropharyngeal erythema or ulceration NECK: supple, no JVD LYMPH:  no palpable lymphadenopathy in the cervical, axillary or inguinal regions LUNGS: clear to auscultation b/l with normal respiratory effort HEART: regular rate & rhythm ABDOMEN:  normoactive bowel sounds , non tender, not distended. Extremity: no pedal edema PSYCH: alert & oriented x 3 with fluent speech  NEURO: no focal motor/sensory deficits  LABORATORY DATA:  I have reviewed the data as listed .    Latest Ref Rng & Units 10/13/2022   12:52 PM 03/26/2022   11:20 AM 03/25/2022    2:40 AM  CBC  WBC 4.0 - 10.5 K/uL 5.3  12.2  15.3   Hemoglobin 12.0 - 15.0 g/dL 12.4  10.5  9.6   Hematocrit 36.0 - 46.0 % 37.4  31.9  28.9   Platelets 150 - 400 K/uL 207  492  456    .    Latest Ref Rng & Units 10/13/2022   12:52 PM 03/26/2022    8:04 AM 03/25/2022    2:40 AM  CMP  Glucose 70 - 99 mg/dL 80  350  328   BUN 8 - 23 mg/dL 27  43  47   Creatinine 0.44 - 1.00 mg/dL 1.70  1.98  2.25   Sodium 135 - 145 mmol/L 144  133  131   Potassium 3.5 - 5.1 mmol/L 3.7  4.7  4.7   Chloride 98 - 111 mmol/L 110  103  104   CO2 22 - 32 mmol/L 24  21  19    Calcium 8.9 - 10.3 mg/dL 9.6  8.5  8.1   Total Protein 6.5 - 8.1 g/dL 7.8  7.5  6.8   Total Bilirubin 0.3 - 1.2 mg/dL 0.5  0.3  0.4   Alkaline Phos 38 - 126 U/L 48  74  70   AST 15 - 41 U/L 33  25  26   ALT 0 - 44 U/L 29  26  26     Component     Latest Ref Rng 10/13/2022  IgG (Immunoglobin G), Serum     586 - 1,602 mg/dL 1,287   IgA     64 - 422 mg/dL 430 (H)   IgM (Immunoglobulin M),  Srm     26 - 217 mg/dL 101   Total Protein ELP     6.0 - 8.5 g/dL 7.2 (C)  Albumin SerPl Elph-Mcnc     2.9 - 4.4 g/dL 4.0 (C)  Alpha 1     0.0 - 0.4 g/dL 0.2 (C)  Alpha2 Glob SerPl Elph-Mcnc     0.4 - 1.0 g/dL 0.6 (C)  B-Globulin SerPl Elph-Mcnc     0.7 - 1.3 g/dL 1.1 (C)  Gamma Glob SerPl Elph-Mcnc     0.4 - 1.8 g/dL 1.3 (C)  M Protein SerPl Elph-Mcnc     Not Observed g/dL Not Observed (C)  Globulin, Total     2.2 - 3.9 g/dL 3.2 (C)  Albumin/Glob SerPl     0.7 - 1.7  1.3 (C)  IFE 1 Comment ! (C) polyclonal increase in immunoglobulins  Please Note (HCV): Comment (C)  Kappa free light chain     3.3 - 19.4 mg/L 56.3 (H)   Lambda free light chains     5.7 - 26.3 mg/L 21.7   Kappa, lambda light chain ratio     0.26 - 1.65  2.59 (H)   Vitamin B12     180 - 914 pg/mL 309     Legend: (H) High ! Abnormal (C) Corrected RADIOGRAPHIC STUDIES: I have personally reviewed the radiological images as listed and agreed with the findings in the report. No results found.  ASSESSMENT & PLAN:   Wonderful 82 y/o female with:  1. MGUS (monoclonal gammopathy of unknown significance 2. Stage 3b chronic kidney diseasee 3. Diabetic retinopathy 4. Secondary  hyperparathyroidism of renal origin 5. age-related osteoporosis, unspecified pathological fracture presence 6. Essential (primary) hypertension 7. Anxiety 8. Pure hypercholesterolemia 9. Type 2 diabetes mellitus with diabetic chronic kidney disease 9. Type 2 diabetes mellitus with diabetic neuropathy, unspecified)  PLAN: -Discussed lab results from 10/13/2022 with the patient. CBC is stable. CMP shows elevated Creatinine at 1.70 which is improved from previous readings. . Vitamin B-12 in the normal range at 309.  Kappa/lambda light chain ration at 2.59.  -Discussed multiple myeloma panel result from 10/13/2022 which did not show M-protein spike and polyclonal increase in immunoglobulins. -Lab does not show any suggestion of multiple  myeloma.  -Continue following up with the PCP and have lab work with multiple myeloma panel and serum free light chains with PCP in 12 months to r/o other progressive monoclonal paraproteinemia or further increase in serum free light chain ratio.  FOLLOW-UP: RTC with PCP  The total time spent in the appointment was 15 minutes* .  All of the patient's questions were answered with apparent satisfaction. The patient knows to call the clinic with any problems, questions or concerns.   Sullivan Lone MD MS AAHIVMS [facility] Hematology/Oncology Physician [facility]  .*Total Encounter Time as defined by the Centers for Medicare and Medicaid Services includes, in addition to the face-to-face time of a patient visit (documented in the note above) non-face-to-face time: obtaining and reviewing outside history, ordering and reviewing medications, tests or procedures, care coordination (communications with other health care professionals or caregivers) and documentation in the medical record.   I, Cleda Mccreedy, am acting as a Education administrator for Sullivan Lone, MD.  .I have reviewed the above documentation for accuracy and completeness, and I agree with the above. Brunetta Genera MD

## 2022-10-30 DIAGNOSIS — N2581 Secondary hyperparathyroidism of renal origin: Secondary | ICD-10-CM | POA: Diagnosis not present

## 2022-10-30 DIAGNOSIS — N1832 Chronic kidney disease, stage 3b: Secondary | ICD-10-CM | POA: Diagnosis not present

## 2022-10-30 DIAGNOSIS — E113299 Type 2 diabetes mellitus with mild nonproliferative diabetic retinopathy without macular edema, unspecified eye: Secondary | ICD-10-CM | POA: Diagnosis not present

## 2022-10-30 DIAGNOSIS — E1165 Type 2 diabetes mellitus with hyperglycemia: Secondary | ICD-10-CM | POA: Diagnosis not present

## 2022-10-30 DIAGNOSIS — E1140 Type 2 diabetes mellitus with diabetic neuropathy, unspecified: Secondary | ICD-10-CM | POA: Diagnosis not present

## 2022-10-30 DIAGNOSIS — H6120 Impacted cerumen, unspecified ear: Secondary | ICD-10-CM | POA: Diagnosis not present

## 2022-10-30 DIAGNOSIS — E1122 Type 2 diabetes mellitus with diabetic chronic kidney disease: Secondary | ICD-10-CM | POA: Diagnosis not present

## 2022-10-30 DIAGNOSIS — E7800 Pure hypercholesterolemia, unspecified: Secondary | ICD-10-CM | POA: Diagnosis not present

## 2022-10-30 DIAGNOSIS — F411 Generalized anxiety disorder: Secondary | ICD-10-CM | POA: Diagnosis not present

## 2022-10-30 DIAGNOSIS — D472 Monoclonal gammopathy: Secondary | ICD-10-CM | POA: Diagnosis not present

## 2022-11-02 DIAGNOSIS — H6123 Impacted cerumen, bilateral: Secondary | ICD-10-CM | POA: Diagnosis not present

## 2022-12-31 DIAGNOSIS — K029 Dental caries, unspecified: Secondary | ICD-10-CM | POA: Diagnosis not present

## 2022-12-31 DIAGNOSIS — R220 Localized swelling, mass and lump, head: Secondary | ICD-10-CM | POA: Diagnosis not present

## 2023-03-18 DIAGNOSIS — N183 Chronic kidney disease, stage 3 unspecified: Secondary | ICD-10-CM | POA: Diagnosis not present

## 2023-03-19 DIAGNOSIS — E7800 Pure hypercholesterolemia, unspecified: Secondary | ICD-10-CM | POA: Diagnosis not present

## 2023-03-19 DIAGNOSIS — E1140 Type 2 diabetes mellitus with diabetic neuropathy, unspecified: Secondary | ICD-10-CM | POA: Diagnosis not present

## 2023-03-19 DIAGNOSIS — N2581 Secondary hyperparathyroidism of renal origin: Secondary | ICD-10-CM | POA: Diagnosis not present

## 2023-03-19 DIAGNOSIS — M810 Age-related osteoporosis without current pathological fracture: Secondary | ICD-10-CM | POA: Diagnosis not present

## 2023-03-19 DIAGNOSIS — N1832 Chronic kidney disease, stage 3b: Secondary | ICD-10-CM | POA: Diagnosis not present

## 2023-03-19 DIAGNOSIS — F411 Generalized anxiety disorder: Secondary | ICD-10-CM | POA: Diagnosis not present

## 2023-03-19 DIAGNOSIS — E11319 Type 2 diabetes mellitus with unspecified diabetic retinopathy without macular edema: Secondary | ICD-10-CM | POA: Diagnosis not present

## 2023-03-19 DIAGNOSIS — E1122 Type 2 diabetes mellitus with diabetic chronic kidney disease: Secondary | ICD-10-CM | POA: Diagnosis not present

## 2023-03-19 DIAGNOSIS — D472 Monoclonal gammopathy: Secondary | ICD-10-CM | POA: Diagnosis not present

## 2023-03-19 DIAGNOSIS — I10 Essential (primary) hypertension: Secondary | ICD-10-CM | POA: Diagnosis not present

## 2023-03-28 DIAGNOSIS — I129 Hypertensive chronic kidney disease with stage 1 through stage 4 chronic kidney disease, or unspecified chronic kidney disease: Secondary | ICD-10-CM | POA: Diagnosis not present

## 2023-03-28 DIAGNOSIS — R809 Proteinuria, unspecified: Secondary | ICD-10-CM | POA: Diagnosis not present

## 2023-03-28 DIAGNOSIS — E1122 Type 2 diabetes mellitus with diabetic chronic kidney disease: Secondary | ICD-10-CM | POA: Diagnosis not present

## 2023-03-28 DIAGNOSIS — N2581 Secondary hyperparathyroidism of renal origin: Secondary | ICD-10-CM | POA: Diagnosis not present

## 2023-03-28 DIAGNOSIS — D631 Anemia in chronic kidney disease: Secondary | ICD-10-CM | POA: Diagnosis not present

## 2023-03-28 DIAGNOSIS — N183 Chronic kidney disease, stage 3 unspecified: Secondary | ICD-10-CM | POA: Diagnosis not present

## 2023-04-04 DIAGNOSIS — N183 Chronic kidney disease, stage 3 unspecified: Secondary | ICD-10-CM | POA: Diagnosis not present

## 2023-06-01 ENCOUNTER — Emergency Department (HOSPITAL_BASED_OUTPATIENT_CLINIC_OR_DEPARTMENT_OTHER): Payer: Medicare PPO

## 2023-06-01 ENCOUNTER — Encounter (HOSPITAL_BASED_OUTPATIENT_CLINIC_OR_DEPARTMENT_OTHER): Payer: Self-pay

## 2023-06-01 ENCOUNTER — Inpatient Hospital Stay (HOSPITAL_BASED_OUTPATIENT_CLINIC_OR_DEPARTMENT_OTHER)
Admission: EM | Admit: 2023-06-01 | Discharge: 2023-06-10 | DRG: 304 | Disposition: A | Discharge status: Skilled Nursing Facility | Payer: Medicare PPO | Attending: Internal Medicine | Admitting: Internal Medicine

## 2023-06-01 ENCOUNTER — Other Ambulatory Visit: Payer: Self-pay

## 2023-06-01 DIAGNOSIS — D472 Monoclonal gammopathy: Secondary | ICD-10-CM | POA: Diagnosis present

## 2023-06-01 DIAGNOSIS — N184 Chronic kidney disease, stage 4 (severe): Secondary | ICD-10-CM | POA: Diagnosis present

## 2023-06-01 DIAGNOSIS — I43 Cardiomyopathy in diseases classified elsewhere: Secondary | ICD-10-CM | POA: Diagnosis present

## 2023-06-01 DIAGNOSIS — Z886 Allergy status to analgesic agent status: Secondary | ICD-10-CM

## 2023-06-01 DIAGNOSIS — I361 Nonrheumatic tricuspid (valve) insufficiency: Secondary | ICD-10-CM | POA: Diagnosis present

## 2023-06-01 DIAGNOSIS — Z7189 Other specified counseling: Secondary | ICD-10-CM

## 2023-06-01 DIAGNOSIS — R7989 Other specified abnormal findings of blood chemistry: Secondary | ICD-10-CM | POA: Diagnosis present

## 2023-06-01 DIAGNOSIS — S8992XA Unspecified injury of left lower leg, initial encounter: Secondary | ICD-10-CM | POA: Diagnosis not present

## 2023-06-01 DIAGNOSIS — M47816 Spondylosis without myelopathy or radiculopathy, lumbar region: Secondary | ICD-10-CM | POA: Diagnosis not present

## 2023-06-01 DIAGNOSIS — M4856XA Collapsed vertebra, not elsewhere classified, lumbar region, initial encounter for fracture: Secondary | ICD-10-CM | POA: Diagnosis present

## 2023-06-01 DIAGNOSIS — I358 Other nonrheumatic aortic valve disorders: Secondary | ICD-10-CM | POA: Diagnosis present

## 2023-06-01 DIAGNOSIS — Z833 Family history of diabetes mellitus: Secondary | ICD-10-CM

## 2023-06-01 DIAGNOSIS — R001 Bradycardia, unspecified: Secondary | ICD-10-CM | POA: Diagnosis present

## 2023-06-01 DIAGNOSIS — Z885 Allergy status to narcotic agent status: Secondary | ICD-10-CM

## 2023-06-01 DIAGNOSIS — I129 Hypertensive chronic kidney disease with stage 1 through stage 4 chronic kidney disease, or unspecified chronic kidney disease: Secondary | ICD-10-CM | POA: Diagnosis present

## 2023-06-01 DIAGNOSIS — Z8673 Personal history of transient ischemic attack (TIA), and cerebral infarction without residual deficits: Secondary | ICD-10-CM | POA: Diagnosis not present

## 2023-06-01 DIAGNOSIS — D539 Nutritional anemia, unspecified: Secondary | ICD-10-CM | POA: Diagnosis present

## 2023-06-01 DIAGNOSIS — E8721 Acute metabolic acidosis: Secondary | ICD-10-CM | POA: Diagnosis present

## 2023-06-01 DIAGNOSIS — K449 Diaphragmatic hernia without obstruction or gangrene: Secondary | ICD-10-CM

## 2023-06-01 DIAGNOSIS — W010XXA Fall on same level from slipping, tripping and stumbling without subsequent striking against object, initial encounter: Secondary | ICD-10-CM | POA: Diagnosis present

## 2023-06-01 DIAGNOSIS — I69351 Hemiplegia and hemiparesis following cerebral infarction affecting right dominant side: Secondary | ICD-10-CM | POA: Diagnosis not present

## 2023-06-01 DIAGNOSIS — W19XXXA Unspecified fall, initial encounter: Secondary | ICD-10-CM | POA: Diagnosis not present

## 2023-06-01 DIAGNOSIS — R42 Dizziness and giddiness: Secondary | ICD-10-CM | POA: Diagnosis not present

## 2023-06-01 DIAGNOSIS — F05 Delirium due to known physiological condition: Secondary | ICD-10-CM | POA: Diagnosis present

## 2023-06-01 DIAGNOSIS — Z79899 Other long term (current) drug therapy: Secondary | ICD-10-CM

## 2023-06-01 DIAGNOSIS — I517 Cardiomegaly: Secondary | ICD-10-CM | POA: Diagnosis not present

## 2023-06-01 DIAGNOSIS — I10 Essential (primary) hypertension: Secondary | ICD-10-CM | POA: Diagnosis present

## 2023-06-01 DIAGNOSIS — S0990XA Unspecified injury of head, initial encounter: Secondary | ICD-10-CM | POA: Diagnosis not present

## 2023-06-01 DIAGNOSIS — I3139 Other pericardial effusion (noninflammatory): Secondary | ICD-10-CM | POA: Diagnosis present

## 2023-06-01 DIAGNOSIS — I169 Hypertensive crisis, unspecified: Secondary | ICD-10-CM | POA: Diagnosis not present

## 2023-06-01 DIAGNOSIS — I161 Hypertensive emergency: Secondary | ICD-10-CM | POA: Diagnosis present

## 2023-06-01 DIAGNOSIS — G9341 Metabolic encephalopathy: Secondary | ICD-10-CM | POA: Diagnosis present

## 2023-06-01 DIAGNOSIS — M1712 Unilateral primary osteoarthritis, left knee: Secondary | ICD-10-CM | POA: Diagnosis not present

## 2023-06-01 DIAGNOSIS — E1159 Type 2 diabetes mellitus with other circulatory complications: Secondary | ICD-10-CM | POA: Diagnosis present

## 2023-06-01 DIAGNOSIS — M79652 Pain in left thigh: Secondary | ICD-10-CM | POA: Diagnosis not present

## 2023-06-01 DIAGNOSIS — I480 Paroxysmal atrial fibrillation: Secondary | ICD-10-CM | POA: Diagnosis present

## 2023-06-01 DIAGNOSIS — H9193 Unspecified hearing loss, bilateral: Secondary | ICD-10-CM | POA: Diagnosis present

## 2023-06-01 DIAGNOSIS — F0394 Unspecified dementia, unspecified severity, with anxiety: Secondary | ICD-10-CM | POA: Diagnosis present

## 2023-06-01 DIAGNOSIS — E785 Hyperlipidemia, unspecified: Secondary | ICD-10-CM | POA: Diagnosis present

## 2023-06-01 DIAGNOSIS — R262 Difficulty in walking, not elsewhere classified: Secondary | ICD-10-CM | POA: Diagnosis not present

## 2023-06-01 DIAGNOSIS — M8580 Other specified disorders of bone density and structure, unspecified site: Secondary | ICD-10-CM | POA: Diagnosis not present

## 2023-06-01 DIAGNOSIS — I6782 Cerebral ischemia: Secondary | ICD-10-CM | POA: Diagnosis not present

## 2023-06-01 DIAGNOSIS — M47812 Spondylosis without myelopathy or radiculopathy, cervical region: Secondary | ICD-10-CM | POA: Diagnosis not present

## 2023-06-01 DIAGNOSIS — Z7401 Bed confinement status: Secondary | ICD-10-CM | POA: Diagnosis not present

## 2023-06-01 DIAGNOSIS — S32010D Wedge compression fracture of first lumbar vertebra, subsequent encounter for fracture with routine healing: Secondary | ICD-10-CM | POA: Diagnosis not present

## 2023-06-01 DIAGNOSIS — E1165 Type 2 diabetes mellitus with hyperglycemia: Secondary | ICD-10-CM | POA: Diagnosis present

## 2023-06-01 DIAGNOSIS — R278 Other lack of coordination: Secondary | ICD-10-CM | POA: Diagnosis not present

## 2023-06-01 DIAGNOSIS — Z794 Long term (current) use of insulin: Secondary | ICD-10-CM | POA: Diagnosis not present

## 2023-06-01 DIAGNOSIS — M4319 Spondylolisthesis, multiple sites in spine: Secondary | ICD-10-CM | POA: Diagnosis not present

## 2023-06-01 DIAGNOSIS — G8190 Hemiplegia, unspecified affecting unspecified side: Secondary | ICD-10-CM | POA: Diagnosis not present

## 2023-06-01 DIAGNOSIS — M85852 Other specified disorders of bone density and structure, left thigh: Secondary | ICD-10-CM | POA: Diagnosis not present

## 2023-06-01 DIAGNOSIS — S299XXA Unspecified injury of thorax, initial encounter: Secondary | ICD-10-CM | POA: Diagnosis not present

## 2023-06-01 DIAGNOSIS — E876 Hypokalemia: Secondary | ICD-10-CM | POA: Diagnosis present

## 2023-06-01 DIAGNOSIS — E1122 Type 2 diabetes mellitus with diabetic chronic kidney disease: Secondary | ICD-10-CM | POA: Diagnosis present

## 2023-06-01 DIAGNOSIS — Z888 Allergy status to other drugs, medicaments and biological substances status: Secondary | ICD-10-CM

## 2023-06-01 DIAGNOSIS — R0600 Dyspnea, unspecified: Secondary | ICD-10-CM | POA: Diagnosis not present

## 2023-06-01 DIAGNOSIS — F419 Anxiety disorder, unspecified: Secondary | ICD-10-CM | POA: Diagnosis present

## 2023-06-01 DIAGNOSIS — R509 Fever, unspecified: Secondary | ICD-10-CM | POA: Diagnosis present

## 2023-06-01 DIAGNOSIS — S199XXA Unspecified injury of neck, initial encounter: Secondary | ICD-10-CM | POA: Diagnosis not present

## 2023-06-01 DIAGNOSIS — M6281 Muscle weakness (generalized): Secondary | ICD-10-CM | POA: Diagnosis not present

## 2023-06-01 DIAGNOSIS — Z515 Encounter for palliative care: Secondary | ICD-10-CM | POA: Diagnosis not present

## 2023-06-01 DIAGNOSIS — I4891 Unspecified atrial fibrillation: Secondary | ICD-10-CM | POA: Diagnosis present

## 2023-06-01 DIAGNOSIS — N189 Chronic kidney disease, unspecified: Secondary | ICD-10-CM | POA: Diagnosis not present

## 2023-06-01 DIAGNOSIS — M47817 Spondylosis without myelopathy or radiculopathy, lumbosacral region: Secondary | ICD-10-CM | POA: Diagnosis not present

## 2023-06-01 DIAGNOSIS — I5A Non-ischemic myocardial injury (non-traumatic): Secondary | ICD-10-CM | POA: Diagnosis present

## 2023-06-01 DIAGNOSIS — Z8249 Family history of ischemic heart disease and other diseases of the circulatory system: Secondary | ICD-10-CM

## 2023-06-01 DIAGNOSIS — Z9049 Acquired absence of other specified parts of digestive tract: Secondary | ICD-10-CM

## 2023-06-01 DIAGNOSIS — S32010A Wedge compression fracture of first lumbar vertebra, initial encounter for closed fracture: Secondary | ICD-10-CM | POA: Diagnosis not present

## 2023-06-01 DIAGNOSIS — M25559 Pain in unspecified hip: Secondary | ICD-10-CM | POA: Diagnosis not present

## 2023-06-01 DIAGNOSIS — E663 Overweight: Secondary | ICD-10-CM | POA: Diagnosis present

## 2023-06-01 DIAGNOSIS — G459 Transient cerebral ischemic attack, unspecified: Secondary | ICD-10-CM | POA: Diagnosis not present

## 2023-06-01 DIAGNOSIS — Z6828 Body mass index (BMI) 28.0-28.9, adult: Secondary | ICD-10-CM

## 2023-06-01 DIAGNOSIS — K5730 Diverticulosis of large intestine without perforation or abscess without bleeding: Secondary | ICD-10-CM | POA: Diagnosis not present

## 2023-06-01 DIAGNOSIS — E875 Hyperkalemia: Secondary | ICD-10-CM | POA: Diagnosis not present

## 2023-06-01 DIAGNOSIS — R519 Headache, unspecified: Secondary | ICD-10-CM | POA: Diagnosis not present

## 2023-06-01 MED ORDER — HYDROCODONE-ACETAMINOPHEN 5-325 MG PO TABS
1.0000 | ORAL_TABLET | Freq: Once | ORAL | Status: DC
  Filled 2023-06-01: qty 1

## 2023-06-01 NOTE — ED Triage Notes (Signed)
Pt was walking up stairs and lost her footing causes her to fall backwards down 2 steps. Pt landed on her back and hit the back of her head. Pt denies LOC. Pt is on Aggrenox. Pt states that she feels dizzy and has slight headache.

## 2023-06-01 NOTE — ED Provider Notes (Incomplete)
[facility] EM[facility]GENC[facility]PARTMENT AT [facility] Provider Note   CSN: [id number] Arrival date & time: 06/01/23  2259     History {Add pertinent medical, surgical, social history, OB history to HPI:1} Chief Complaint  Patient presents with   Donna Green is a 82 y.o. female.  Patient with a history of CKD, previous stroke, hypertension, diabetes, on Aggrenox presenting with fall.  Daughter states patient was going up 2 steps in the kitchen and fell backwards striking her head and low back.  This was not witnessed.  No loss of consciousness.  Complains of pain to her head and low back.  She does take Aggrenox.  No focal weakness, numbness or tingling.  No chest pain or shortness of breath.  No abdominal pain, nausea or vomiting.  She has had some confusion over since the episode and now feels dizzy with some headache.  Was not dizzy prior to the fall.  No room spinning dizziness or lightheadedness.  No cough, chest pain, shortness of breath, nausea, vomiting or fever.  The history is provided by the patient and a relative.  Fall Associated symptoms include headaches. Pertinent negatives include no chest pain, no abdominal pain and no shortness of breath.       Home Medications Prior to Admission medications   Medication Sig Start Date End Date Taking? Authorizing Provider  acetaminophen (TYLENOL) 325 MG tablet Take 650 mg by mouth 2 (two) times daily.    [provider]  amLODipine (NORVASC) 10 MG tablet Take 10 mg by mouth daily.    [provider]  dextromethorphan (DELSYM) 30 MG/5ML liquid Take 30 mg by mouth daily as needed for cough. Patient not taking: Reported on 10/13/2022    [provider]  dipyridamole-aspirin (AGGRENOX) 200-25 MG per 12 hr capsule Take 1 capsule by mouth 2 (two) times daily.    [provider]  insulin aspart (NOVOLOG FLEXPEN) 100 UNIT/ML FlexPen Inject 8 Units into the skin 3 (three) times daily with  meals.    [provider]  insulin glargine (LANTUS) 100 UNIT/ML injection Inject 14 Units into the skin at bedtime.    [provider]  LORazepam (ATIVAN) 1 MG tablet Take 1 mg by mouth 2 (two) times daily.    [provider]  Menthol, Topical Analgesic, (ZIMS MAX-FREEZE EX) Apply 1 Application topically 2 (two) times daily as needed (pain). Patient not taking: Reported on 10/13/2022    [provider]  metoprolol (LOPRESSOR) 100 MG tablet Take 50-100 mg by mouth See admin instructions. 50 mg in the morning, 100 mg in the evening    [provider]  rosuvastatin (CRESTOR) 20 MG tablet Take 20 mg by mouth daily.    [provider]      Allergies    Codeine, Colcrys [colchicine], Zyloprim [allopurinol], and Nsaids    Review of Systems   Review of Systems  Constitutional:  Negative for activity change, appetite change and fever.  HENT:  Negative for congestion and rhinorrhea.   Respiratory:  Negative for cough, chest tightness and shortness of breath.   Cardiovascular:  Negative for chest pain.  Gastrointestinal:  Negative for abdominal pain, nausea and vomiting.  Genitourinary:  Negative for dysuria and hematuria.  Musculoskeletal:  Positive for arthralgias, back pain, myalgias and neck pain.  Skin:  Negative for rash.  Neurological:  Positive for dizziness and headaches. Negative for weakness and light-headedness.   all other systems are negative except as  noted in the HPI and PMH.    Physical Exam Updated Vital Signs BP (!) 209/98 (BP Location: Right Arm)   Pulse 69   Temp 98.2 F (36.8 C) (Oral)   Resp 18   SpO2 95%  Physical Exam Vitals and nursing note reviewed.  Constitutional:      General: She is not in acute distress.    Appearance: She is well-developed. She is not ill-appearing.  HENT:     Head: Normocephalic and atraumatic.     Mouth/Throat:     Pharynx: No oropharyngeal exudate.  Eyes:     Conjunctiva/sclera:  Conjunctivae normal.     Pupils: Pupils are equal, round, and reactive to light.  Neck:     Comments: No midline C-spine tenderness Cardiovascular:     Rate and Rhythm: Normal rate and regular rhythm.     Heart sounds: Normal heart sounds. No murmur heard. Pulmonary:     Effort: Pulmonary effort is normal. No respiratory distress.     Breath sounds: Normal breath sounds.  Chest:     Chest wall: No tenderness.  Abdominal:     Palpations: Abdomen is soft.     Tenderness: There is no abdominal tenderness. There is no guarding or rebound.  Musculoskeletal:        General: Tenderness present. Normal range of motion.     Cervical back: Normal range of motion and neck supple.     Comments: Tenderness to lumbar spine without step-off or deformity. Full range of motion of bilateral hips without pain. Able to range left knee without bony tenderness  Skin:    General: Skin is warm.  Neurological:     Mental Status: She is alert and oriented to person, place, and time.     Cranial Nerves: No cranial nerve deficit.     Motor: No abnormal muscle tone.     Coordination: Coordination normal.     Comments:  5/5 strength throughout. CN 2-12 intact.Equal grip strength.   Psychiatric:        Behavior: Behavior normal.     ED Results / Procedures / Treatments   Labs (all labs ordered are listed, but only abnormal results are displayed) Labs Reviewed  CBC WITH DIFFERENTIAL/PLATELET  BASIC METABOLIC PANEL  CBG MONITORING, ED  TROPONIN I (HIGH SENSITIVITY)    EKG EKG Interpretation Date/Time:  Saturday June 01 2023 23:37:43 EDT Ventricular Rate:  77 PR Interval:  143 QRS Duration:  85 QT Interval:  413 QTC Calculation: 468 R Axis:   15  Text Interpretation: Sinus rhythm LVH with secondary repolarization abnormality No significant change was found Confirmed by Glynn Octave [phone]) on 06/01/2023 11:45:24 PM  Radiology No results found.  Procedures Procedures  {Document  cardiac monitor, telemetry assessment procedure when appropriate:1}  Medications Ordered in ED Medications  HYDROcodone-acetaminophen (NORCO/VICODIN) 5-325 MG per tablet 1 tablet (has no administration in time range)    ED Course/ Medical Decision Making/ A&P   {   Click here for ABCD2, HEART and other calculatorsREFRESH Note before signing :1}                              Medical Decision Making Amount and/or Complexity of Data Reviewed Independent Historian: caregiver Labs: ordered. Decision-making details documented in ED Course. Radiology: ordered and independent interpretation performed. Decision-making details documented in ED Course. ECG/medicine tests: ordered and independent interpretation performed. Decision-making details documented in ED Course.  Risk Prescription  drug management.   Suspected mechanical fall with head injury and back pain.  Does take Aggrenox.  Neurovascularly intact.  GCS is 15, vital signs are stable.  EKG is unchanged sinus rhythm with LVH.  Will obtain traumatic imaging including CT head, C-spine and lumbar spine.  {Document critical care time when appropriate:1} {Document review of labs and clinical decision tools ie heart score, Chads2Vasc2 etc:1}  {Document your independent review of radiology images, and any outside records:1} {Document your discussion with family members, caretakers, and with consultants:1} {Document social determinants of health affecting pt's care:1} {Document your decision making why or why not admission, treatments were needed:1} Final Clinical Impression(s) / ED Diagnoses Final diagnoses:  None    Rx / DC Orders ED Discharge Orders     None

## 2023-06-02 ENCOUNTER — Emergency Department (HOSPITAL_BASED_OUTPATIENT_CLINIC_OR_DEPARTMENT_OTHER): Payer: Medicare PPO | Admitting: Radiology

## 2023-06-02 ENCOUNTER — Observation Stay (HOSPITAL_BASED_OUTPATIENT_CLINIC_OR_DEPARTMENT_OTHER): Payer: Medicare PPO

## 2023-06-02 ENCOUNTER — Emergency Department (HOSPITAL_BASED_OUTPATIENT_CLINIC_OR_DEPARTMENT_OTHER): Payer: Medicare PPO

## 2023-06-02 DIAGNOSIS — K449 Diaphragmatic hernia without obstruction or gangrene: Secondary | ICD-10-CM | POA: Diagnosis not present

## 2023-06-02 DIAGNOSIS — E1165 Type 2 diabetes mellitus with hyperglycemia: Secondary | ICD-10-CM | POA: Diagnosis not present

## 2023-06-02 DIAGNOSIS — R7989 Other specified abnormal findings of blood chemistry: Secondary | ICD-10-CM | POA: Diagnosis not present

## 2023-06-02 DIAGNOSIS — I3139 Other pericardial effusion (noninflammatory): Secondary | ICD-10-CM | POA: Diagnosis not present

## 2023-06-02 DIAGNOSIS — M4856XA Collapsed vertebra, not elsewhere classified, lumbar region, initial encounter for fracture: Secondary | ICD-10-CM | POA: Diagnosis not present

## 2023-06-02 DIAGNOSIS — M47816 Spondylosis without myelopathy or radiculopathy, lumbar region: Secondary | ICD-10-CM | POA: Diagnosis not present

## 2023-06-02 DIAGNOSIS — I69351 Hemiplegia and hemiparesis following cerebral infarction affecting right dominant side: Secondary | ICD-10-CM | POA: Diagnosis not present

## 2023-06-02 DIAGNOSIS — I169 Hypertensive crisis, unspecified: Secondary | ICD-10-CM | POA: Diagnosis not present

## 2023-06-02 DIAGNOSIS — R509 Fever, unspecified: Secondary | ICD-10-CM | POA: Diagnosis not present

## 2023-06-02 DIAGNOSIS — I480 Paroxysmal atrial fibrillation: Secondary | ICD-10-CM | POA: Diagnosis not present

## 2023-06-02 DIAGNOSIS — W010XXA Fall on same level from slipping, tripping and stumbling without subsequent striking against object, initial encounter: Secondary | ICD-10-CM | POA: Diagnosis not present

## 2023-06-02 DIAGNOSIS — D472 Monoclonal gammopathy: Secondary | ICD-10-CM | POA: Diagnosis not present

## 2023-06-02 DIAGNOSIS — I6782 Cerebral ischemia: Secondary | ICD-10-CM | POA: Diagnosis not present

## 2023-06-02 DIAGNOSIS — R001 Bradycardia, unspecified: Secondary | ICD-10-CM | POA: Diagnosis not present

## 2023-06-02 DIAGNOSIS — I4891 Unspecified atrial fibrillation: Secondary | ICD-10-CM

## 2023-06-02 DIAGNOSIS — I129 Hypertensive chronic kidney disease with stage 1 through stage 4 chronic kidney disease, or unspecified chronic kidney disease: Secondary | ICD-10-CM | POA: Diagnosis not present

## 2023-06-02 DIAGNOSIS — M8580 Other specified disorders of bone density and structure, unspecified site: Secondary | ICD-10-CM | POA: Diagnosis not present

## 2023-06-02 DIAGNOSIS — N184 Chronic kidney disease, stage 4 (severe): Secondary | ICD-10-CM | POA: Diagnosis not present

## 2023-06-02 DIAGNOSIS — I161 Hypertensive emergency: Secondary | ICD-10-CM | POA: Diagnosis present

## 2023-06-02 DIAGNOSIS — I361 Nonrheumatic tricuspid (valve) insufficiency: Secondary | ICD-10-CM | POA: Diagnosis not present

## 2023-06-02 DIAGNOSIS — E1159 Type 2 diabetes mellitus with other circulatory complications: Secondary | ICD-10-CM | POA: Diagnosis not present

## 2023-06-02 DIAGNOSIS — I517 Cardiomegaly: Secondary | ICD-10-CM | POA: Diagnosis not present

## 2023-06-02 DIAGNOSIS — R42 Dizziness and giddiness: Secondary | ICD-10-CM | POA: Diagnosis not present

## 2023-06-02 DIAGNOSIS — M1712 Unilateral primary osteoarthritis, left knee: Secondary | ICD-10-CM | POA: Diagnosis not present

## 2023-06-02 DIAGNOSIS — M25559 Pain in unspecified hip: Secondary | ICD-10-CM | POA: Diagnosis not present

## 2023-06-02 DIAGNOSIS — S299XXA Unspecified injury of thorax, initial encounter: Secondary | ICD-10-CM | POA: Diagnosis not present

## 2023-06-02 DIAGNOSIS — M47817 Spondylosis without myelopathy or radiculopathy, lumbosacral region: Secondary | ICD-10-CM | POA: Diagnosis not present

## 2023-06-02 DIAGNOSIS — F05 Delirium due to known physiological condition: Secondary | ICD-10-CM | POA: Diagnosis not present

## 2023-06-02 DIAGNOSIS — F0394 Unspecified dementia, unspecified severity, with anxiety: Secondary | ICD-10-CM | POA: Diagnosis not present

## 2023-06-02 DIAGNOSIS — M4319 Spondylolisthesis, multiple sites in spine: Secondary | ICD-10-CM | POA: Diagnosis not present

## 2023-06-02 DIAGNOSIS — Z794 Long term (current) use of insulin: Secondary | ICD-10-CM | POA: Diagnosis not present

## 2023-06-02 DIAGNOSIS — S199XXA Unspecified injury of neck, initial encounter: Secondary | ICD-10-CM | POA: Diagnosis not present

## 2023-06-02 DIAGNOSIS — S32010A Wedge compression fracture of first lumbar vertebra, initial encounter for closed fracture: Secondary | ICD-10-CM | POA: Diagnosis present

## 2023-06-02 DIAGNOSIS — K5730 Diverticulosis of large intestine without perforation or abscess without bleeding: Secondary | ICD-10-CM | POA: Diagnosis not present

## 2023-06-02 DIAGNOSIS — S8992XA Unspecified injury of left lower leg, initial encounter: Secondary | ICD-10-CM | POA: Diagnosis not present

## 2023-06-02 DIAGNOSIS — E1122 Type 2 diabetes mellitus with diabetic chronic kidney disease: Secondary | ICD-10-CM | POA: Diagnosis not present

## 2023-06-02 DIAGNOSIS — I5A Non-ischemic myocardial injury (non-traumatic): Secondary | ICD-10-CM | POA: Diagnosis not present

## 2023-06-02 DIAGNOSIS — S0990XA Unspecified injury of head, initial encounter: Secondary | ICD-10-CM | POA: Diagnosis not present

## 2023-06-02 DIAGNOSIS — D539 Nutritional anemia, unspecified: Secondary | ICD-10-CM | POA: Diagnosis not present

## 2023-06-02 DIAGNOSIS — M79652 Pain in left thigh: Secondary | ICD-10-CM | POA: Diagnosis not present

## 2023-06-02 DIAGNOSIS — R519 Headache, unspecified: Secondary | ICD-10-CM | POA: Diagnosis not present

## 2023-06-02 DIAGNOSIS — M47812 Spondylosis without myelopathy or radiculopathy, cervical region: Secondary | ICD-10-CM | POA: Diagnosis not present

## 2023-06-02 DIAGNOSIS — E8721 Acute metabolic acidosis: Secondary | ICD-10-CM | POA: Diagnosis not present

## 2023-06-02 DIAGNOSIS — E876 Hypokalemia: Secondary | ICD-10-CM | POA: Diagnosis present

## 2023-06-02 DIAGNOSIS — M85852 Other specified disorders of bone density and structure, left thigh: Secondary | ICD-10-CM | POA: Diagnosis not present

## 2023-06-02 DIAGNOSIS — G9341 Metabolic encephalopathy: Secondary | ICD-10-CM | POA: Diagnosis not present

## 2023-06-02 DIAGNOSIS — I43 Cardiomyopathy in diseases classified elsewhere: Secondary | ICD-10-CM | POA: Diagnosis not present

## 2023-06-02 LAB — CBC WITH DIFFERENTIAL/PLATELET
Abs Immature Granulocytes: 0.06 10*3/uL (ref 0.00–0.07)
Abs Immature Granulocytes: 0.03 10*3/uL (ref 0.00–0.07)
Basophils Absolute: 0 10*3/uL (ref 0.0–0.1)
Basophils Absolute: 0 10*3/uL (ref 0.0–0.1)
Basophils Relative: 0 %
Basophils Relative: 0 %
Eosinophils Absolute: 0 10*3/uL (ref 0.0–0.5)
Eosinophils Absolute: 0 10*3/uL (ref 0.0–0.5)
Eosinophils Relative: 1 %
Eosinophils Relative: 1 %
HCT: 39.1 % (ref 36.0–46.0)
HCT: 35 % — ABNORMAL LOW (ref 36.0–46.0)
Hemoglobin: 12.8 g/dL (ref 12.0–15.0)
Hemoglobin: 11.6 g/dL — ABNORMAL LOW (ref 12.0–15.0)
Immature Granulocytes: 1 %
Immature Granulocytes: 0 %
Lymphocytes Relative: 19 %
Lymphocytes Relative: 32 %
Lymphs Abs: 1.7 10*3/uL (ref 0.7–4.0)
Lymphs Abs: 2.4 10*3/uL (ref 0.7–4.0)
MCH: 33.3 pg (ref 26.0–34.0)
MCH: 33.7 pg (ref 26.0–34.0)
MCHC: 32.7 g/dL (ref 30.0–36.0)
MCHC: 33.1 g/dL (ref 30.0–36.0)
MCV: 101.8 fL — ABNORMAL HIGH (ref 80.0–100.0)
MCV: 101.7 fL — ABNORMAL HIGH (ref 80.0–100.0)
Monocytes Absolute: 0.6 10*3/uL (ref 0.1–1.0)
Monocytes Absolute: 0.6 10*3/uL (ref 0.1–1.0)
Monocytes Relative: 7 %
Monocytes Relative: 9 %
Neutro Abs: 6.3 10*3/uL (ref 1.7–7.7)
Neutro Abs: 4.4 10*3/uL (ref 1.7–7.7)
Neutrophils Relative %: 72 %
Neutrophils Relative %: 58 %
Platelets: 237 10*3/uL (ref 150–400)
Platelets: 212 10*3/uL (ref 150–400)
RBC: 3.84 MIL/uL — ABNORMAL LOW (ref 3.87–5.11)
RBC: 3.44 MIL/uL — ABNORMAL LOW (ref 3.87–5.11)
RDW: 13.7 % (ref 11.5–15.5)
RDW: 13.8 % (ref 11.5–15.5)
WBC: 8.7 10*3/uL (ref 4.0–10.5)
WBC: 7.5 10*3/uL (ref 4.0–10.5)
nRBC: 0 % (ref 0.0–0.2)
nRBC: 0 % (ref 0.0–0.2)

## 2023-06-02 LAB — COMPREHENSIVE METABOLIC PANEL
ALT: 11 U/L (ref 0–44)
AST: 19 U/L (ref 15–41)
Albumin: 3.3 g/dL — ABNORMAL LOW (ref 3.5–5.0)
Alkaline Phosphatase: 42 U/L (ref 38–126)
Anion gap: 12 (ref 5–15)
BUN: 29 mg/dL — ABNORMAL HIGH (ref 8–23)
CO2: 21 mmol/L — ABNORMAL LOW (ref 22–32)
Calcium: 8.3 mg/dL — ABNORMAL LOW (ref 8.9–10.3)
Chloride: 107 mmol/L (ref 98–111)
Creatinine, Ser: 1.98 mg/dL — ABNORMAL HIGH (ref 0.44–1.00)
GFR, Estimated: 25 mL/min — ABNORMAL LOW (ref >60)
Glucose, Bld: 216 mg/dL — ABNORMAL HIGH (ref 70–99)
Potassium: 3.3 mmol/L — ABNORMAL LOW (ref 3.5–5.1)
Sodium: 140 mmol/L (ref 135–145)
Total Bilirubin: 0.4 mg/dL (ref 0.3–1.2)
Total Protein: 6.4 g/dL — ABNORMAL LOW (ref 6.5–8.1)

## 2023-06-02 LAB — BASIC METABOLIC PANEL
Anion gap: 11 (ref 5–15)
BUN: 29 mg/dL — ABNORMAL HIGH (ref 8–23)
CO2: 26 mmol/L (ref 22–32)
Calcium: 8.9 mg/dL (ref 8.9–10.3)
Chloride: 104 mmol/L (ref 98–111)
Creatinine, Ser: 2.09 mg/dL — ABNORMAL HIGH (ref 0.44–1.00)
GFR, Estimated: 23 mL/min — ABNORMAL LOW (ref >60)
Glucose, Bld: 218 mg/dL — ABNORMAL HIGH (ref 70–99)
Potassium: 3.9 mmol/L (ref 3.5–5.1)
Sodium: 141 mmol/L (ref 135–145)

## 2023-06-02 LAB — ECHOCARDIOGRAM COMPLETE
AR max vel: 1.92 cm2
AV Area VTI: 1.67 cm2
AV Area mean vel: 1.69 cm2
AV Mean grad: 4 mm[Hg]
AV Peak grad: 6.3 mm[Hg]
Ao pk vel: 1.25 m/s
Area-P 1/2: 3.6 cm2
Height: 65 in
MV VTI: 1.48 cm2
S' Lateral: 2.3 cm
Weight: 2666.68 [oz_av]

## 2023-06-02 LAB — CBG MONITORING, ED
Glucose-Capillary: 173 mg/dL — ABNORMAL HIGH (ref 70–99)
Glucose-Capillary: 226 mg/dL — ABNORMAL HIGH (ref 70–99)

## 2023-06-02 LAB — MRSA NEXT GEN BY PCR, NASAL: MRSA by PCR Next Gen: NOT DETECTED

## 2023-06-02 LAB — MAGNESIUM: Magnesium: 1.1 mg/dL — ABNORMAL LOW (ref 1.7–2.4)

## 2023-06-02 LAB — TROPONIN I (HIGH SENSITIVITY)
Troponin I (High Sensitivity): 109 ng/L (ref <18)
Troponin I (High Sensitivity): 104 ng/L (ref <18)
Troponin I (High Sensitivity): 105 ng/L (ref <18)
Troponin I (High Sensitivity): 92 ng/L — ABNORMAL HIGH (ref <18)

## 2023-06-02 LAB — GLUCOSE, CAPILLARY
Glucose-Capillary: 202 mg/dL — ABNORMAL HIGH (ref 70–99)
Glucose-Capillary: 138 mg/dL — ABNORMAL HIGH (ref 70–99)

## 2023-06-02 LAB — TSH: TSH: 1.86 u[IU]/mL (ref 0.350–4.500)

## 2023-06-02 MED ORDER — PANTOPRAZOLE SODIUM 40 MG PO TBEC
40.0000 mg | DELAYED_RELEASE_TABLET | Freq: Every day | ORAL | Status: DC
  Administered 2023-06-02: 40 mg via ORAL
  Filled 2023-06-02: qty 1

## 2023-06-02 MED ORDER — ONDANSETRON HCL 4 MG PO TABS: 4.0000 mg | ORAL_TABLET | Freq: Four times a day (QID) | ORAL | Status: DC | PRN

## 2023-06-02 MED ORDER — HYDROMORPHONE HCL 1 MG/ML IJ SOLN
0.5000 mg | Freq: Once | INTRAMUSCULAR | Status: DC
  Filled 2023-06-02: qty 1

## 2023-06-02 MED ORDER — ACETAMINOPHEN 325 MG PO TABS
650.0000 mg | ORAL_TABLET | Freq: Four times a day (QID) | ORAL | Status: DC | PRN
  Administered 2023-06-02 – 2023-06-10 (×12): 650 mg via ORAL
  Filled 2023-06-02 (×11): qty 2

## 2023-06-02 MED ORDER — LOSARTAN POTASSIUM 50 MG PO TABS
25.0000 mg | ORAL_TABLET | Freq: Every day | ORAL | Status: DC
  Administered 2023-06-02: 25 mg via ORAL
  Filled 2023-06-02: qty 1

## 2023-06-02 MED ORDER — MAGNESIUM SULFATE 2 GM/50ML IV SOLN
2.0000 g | Freq: Once | INTRAVENOUS | Status: AC
  Administered 2023-06-02: 2 g via INTRAVENOUS
  Filled 2023-06-02: qty 50

## 2023-06-02 MED ORDER — INSULIN GLARGINE-YFGN 100 UNIT/ML ~~LOC~~ SOLN
14.0000 [IU] | Freq: Every day | SUBCUTANEOUS | Status: DC
  Administered 2023-06-02 – 2023-06-09 (×8): 14 [IU] via SUBCUTANEOUS
  Filled 2023-06-02 (×11): qty 0.14

## 2023-06-02 MED ORDER — ENOXAPARIN SODIUM 30 MG/0.3ML IJ SOSY
30.0000 mg | PREFILLED_SYRINGE | INTRAMUSCULAR | Status: DC
  Administered 2023-06-02: 30 mg via SUBCUTANEOUS
  Filled 2023-06-02: qty 0.3

## 2023-06-02 MED ORDER — METOPROLOL TARTRATE 25 MG PO TABS
50.0000 mg | ORAL_TABLET | ORAL | Status: DC
Start: 2023-06-02 — End: 2023-06-02

## 2023-06-02 MED ORDER — HYDRALAZINE HCL 20 MG/ML IJ SOLN
5.0000 mg | Freq: Once | INTRAMUSCULAR | Status: AC
  Administered 2023-06-02: 5 mg via INTRAVENOUS
  Filled 2023-06-02: qty 1

## 2023-06-02 MED ORDER — SODIUM CHLORIDE 0.9 % IV BOLUS
1000.0000 mL | Freq: Once | INTRAVENOUS | Status: AC
  Administered 2023-06-02: 1000 mL via INTRAVENOUS

## 2023-06-02 MED ORDER — ACETAMINOPHEN 650 MG RE SUPP: 650.0000 mg | Freq: Four times a day (QID) | RECTAL | Status: DC | PRN

## 2023-06-02 MED ORDER — ASPIRIN-DIPYRIDAMOLE ER 25-200 MG PO CP12
1.0000 | ORAL_CAPSULE | Freq: Two times a day (BID) | ORAL | Status: DC
  Administered 2023-06-02 – 2023-06-04 (×6): 1 via ORAL
  Filled 2023-06-02 (×8): qty 1

## 2023-06-02 MED ORDER — METOPROLOL TARTRATE 25 MG PO TABS
50.0000 mg | ORAL_TABLET | Freq: Every morning | ORAL | Status: DC
  Administered 2023-06-02: 50 mg via ORAL
  Filled 2023-06-02: qty 2

## 2023-06-02 MED ORDER — DILTIAZEM HCL 25 MG/5ML IV SOLN: 5.0000 mg | INTRAVENOUS | Status: DC | PRN

## 2023-06-02 MED ORDER — ORAL CARE MOUTH RINSE: 15.0000 mL | OROMUCOSAL | Status: DC | PRN

## 2023-06-02 MED ORDER — LABETALOL HCL 5 MG/ML IV SOLN
20.0000 mg | Freq: Once | INTRAVENOUS | Status: AC
  Administered 2023-06-02: 20 mg via INTRAVENOUS
  Filled 2023-06-02: qty 4

## 2023-06-02 MED ORDER — ROSUVASTATIN CALCIUM 20 MG PO TABS
20.0000 mg | ORAL_TABLET | Freq: Every day | ORAL | Status: DC
  Administered 2023-06-02 – 2023-06-03 (×2): 20 mg via ORAL
  Filled 2023-06-02 (×2): qty 1

## 2023-06-02 MED ORDER — CARVEDILOL 12.5 MG PO TABS
12.5000 mg | ORAL_TABLET | Freq: Two times a day (BID) | ORAL | Status: DC
  Administered 2023-06-02: 12.5 mg via ORAL
  Filled 2023-06-02 (×2): qty 1

## 2023-06-02 MED ORDER — LORAZEPAM 2 MG/ML IJ SOLN
1.0000 mg | Freq: Once | INTRAMUSCULAR | Status: AC
  Administered 2023-06-02: 1 mg via INTRAVENOUS

## 2023-06-02 MED ORDER — AMLODIPINE BESYLATE 5 MG PO TABS
2.5000 mg | ORAL_TABLET | Freq: Every day | ORAL | Status: DC
  Administered 2023-06-02: 2.5 mg via ORAL
  Filled 2023-06-02: qty 1

## 2023-06-02 MED ORDER — ASPIRIN 81 MG PO CHEW
324.0000 mg | CHEWABLE_TABLET | Freq: Once | ORAL | Status: AC
  Administered 2023-06-02: 324 mg via ORAL
  Filled 2023-06-02: qty 4

## 2023-06-02 MED ORDER — HYDROMORPHONE HCL 1 MG/ML IJ SOLN
0.5000 mg | INTRAMUSCULAR | Status: DC | PRN
  Administered 2023-06-02 – 2023-06-03 (×4): 0.5 mg via INTRAVENOUS
  Filled 2023-06-02 (×3): qty 1

## 2023-06-02 MED ORDER — METOPROLOL TARTRATE 25 MG PO TABS
100.0000 mg | ORAL_TABLET | Freq: Once | ORAL | Status: AC
  Administered 2023-06-02: 100 mg via ORAL
  Filled 2023-06-02: qty 4

## 2023-06-02 MED ORDER — ONDANSETRON HCL 4 MG/2ML IJ SOLN
4.0000 mg | Freq: Four times a day (QID) | INTRAMUSCULAR | Status: DC | PRN
  Administered 2023-06-03: 4 mg via INTRAVENOUS
  Filled 2023-06-02: qty 2

## 2023-06-02 MED ORDER — POTASSIUM CHLORIDE CRYS ER 20 MEQ PO TBCR
40.0000 meq | EXTENDED_RELEASE_TABLET | Freq: Once | ORAL | Status: AC
  Administered 2023-06-02: 40 meq via ORAL
  Filled 2023-06-02: qty 2

## 2023-06-02 MED ORDER — ENOXAPARIN SODIUM 30 MG/0.3ML IJ SOSY: 30.0000 mg | PREFILLED_SYRINGE | INTRAMUSCULAR | Status: DC

## 2023-06-02 MED ORDER — ACETAMINOPHEN 325 MG PO TABS
650.0000 mg | ORAL_TABLET | Freq: Once | ORAL | Status: AC
  Administered 2023-06-02: 650 mg via ORAL
  Filled 2023-06-02: qty 2

## 2023-06-02 MED ORDER — LABETALOL HCL 5 MG/ML IV SOLN
10.0000 mg | INTRAVENOUS | Status: DC | PRN
  Administered 2023-06-02 – 2023-06-09 (×5): 10 mg via INTRAVENOUS
  Filled 2023-06-02 (×5): qty 4

## 2023-06-02 MED ORDER — METOPROLOL TARTRATE 25 MG PO TABS: 100.0000 mg | ORAL_TABLET | Freq: Every day | ORAL | Status: DC

## 2023-06-02 MED ORDER — CHLORHEXIDINE GLUCONATE CLOTH 2 % EX PADS
6.0000 | MEDICATED_PAD | Freq: Every day | CUTANEOUS | Status: DC
Start: 2023-06-02 — End: 2023-06-04
  Administered 2023-06-02 – 2023-06-03 (×2): 6 via TOPICAL

## 2023-06-02 MED ORDER — LORAZEPAM 0.5 MG PO TABS
0.5000 mg | ORAL_TABLET | Freq: Three times a day (TID) | ORAL | Status: DC | PRN
  Administered 2023-06-02 – 2023-06-03 (×2): 0.5 mg via ORAL
  Filled 2023-06-02 (×2): qty 1

## 2023-06-02 MED ORDER — LORAZEPAM 2 MG/ML IJ SOLN
INTRAMUSCULAR | Status: AC
  Filled 2023-06-02: qty 1

## 2023-06-02 MED ORDER — INSULIN ASPART 100 UNIT/ML IJ SOLN
0.0000 [IU] | Freq: Three times a day (TID) | INTRAMUSCULAR | Status: DC
  Administered 2023-06-02: 1 [IU] via SUBCUTANEOUS
  Administered 2023-06-02: 3 [IU] via SUBCUTANEOUS
  Administered 2023-06-03: 2 [IU] via SUBCUTANEOUS
  Administered 2023-06-03: 1 [IU] via SUBCUTANEOUS
  Administered 2023-06-04 (×3): 2 [IU] via SUBCUTANEOUS
  Administered 2023-06-05: 3 [IU] via SUBCUTANEOUS
  Administered 2023-06-05: 1 [IU] via SUBCUTANEOUS
  Administered 2023-06-05 – 2023-06-06 (×2): 2 [IU] via SUBCUTANEOUS
  Administered 2023-06-06: 1 [IU] via SUBCUTANEOUS
  Administered 2023-06-06: 5 [IU] via SUBCUTANEOUS
  Administered 2023-06-07 (×2): 1 [IU] via SUBCUTANEOUS
  Administered 2023-06-07: 2 [IU] via SUBCUTANEOUS
  Administered 2023-06-08: 5 [IU] via SUBCUTANEOUS
  Administered 2023-06-08: 1 [IU] via SUBCUTANEOUS
  Administered 2023-06-08: 3 [IU] via SUBCUTANEOUS
  Administered 2023-06-09 (×2): 2 [IU] via SUBCUTANEOUS
  Administered 2023-06-09 – 2023-06-10 (×2): 5 [IU] via SUBCUTANEOUS
  Administered 2023-06-10: 1 [IU] via SUBCUTANEOUS

## 2023-06-02 NOTE — Plan of Care (Signed)
  Problem: Clinical Measurements: Goal: Ability to maintain clinical measurements within normal limits will improve Outcome: Progressing Goal: Will remain free from infection Outcome: Progressing Goal: Cardiovascular complication will be avoided Outcome: Progressing   Problem: Nutrition: Goal: Adequate nutrition will be maintained Outcome: Progressing   

## 2023-06-02 NOTE — Progress Notes (Signed)
Orthopedic Tech Progress Note Patient Details:  Donna Green Apr 19, 1941 [id number]  Ortho Devices Type of Ortho Device: Thoracolumbar corset (TLSO) Ortho Device/Splint Interventions: Ordered, Application, Adjustment   Post Interventions Patient Tolerated: Well Instructions Provided: Adjustment of device  Tonye Pearson 06/02/2023, 11:18 AM

## 2023-06-02 NOTE — H&P (Addendum)
History and Physical    Patient: Donna Green BJY:[id number] DOB: [birth year] DOA: 06/01/2023 DOS: the patient was seen and examined on 06/02/2023 PCP: Renford Dills, MD  Patient coming from: Home  Chief Complaint:  Chief Complaint  Patient presents with   Fall   HPI: Donna Green is a 82 y.o. 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  No LOC, but failed DC and was mildly confused initially. No fever, chills or night sweats. No sore throat, rhinorrhea, dyspnea, wheezing or hemoptysis.  No chest pain, palpitations, diaphoresis, PND, orthopnea or recent pitting edema of the lower extremities.  No appetite changes, frequent heartburn, diarrhea, constipation, melena or hematochezia.  No flank pain, dysuria, frequency or hematuria.  No polyuria, polydipsia, polyphagia or blurred vision.  Lab work: CBC showed a white count of 8.7, hemoglobin 12.8 g/dL with an MCV of 086.5 fL and platelets 237.  Troponin was 109 then 104 then 105 then 92 ng/L.  Magnesium 1.1 mg/deciliter.  TSH was normal.  CMP showed a potassium of 3.3 and CO2 of 21 mmol/L, the rest of the electrolytes were normal after calcium correction.  Renal function consistent with baseline.  Total protein 6.4 and albumin 3.3 g/dL.  Imaging: 2 view chest radiograph with cardiomegaly and aortic atherosclerosis but no acute evidence.  There is a large hiatal hernia.  CT head with no acute intracranial abnormality.  There is chronic atrophy of small vessel ischemic changes.  CT cervical spine normal alignment no acute displaced fractures.  There are prominent DDD changes.  CT abdomen/pelvis without contrast  showing a mild L1 superior endplate central compression fracture with slight posterior bowing of the dorsal vertebral cortex but no significant anterior posterior height loss.  Large hiatal hernia with inverted intrathoracic stomach.  Aortic and coronary artery atherosclerosis.  Sludge or noncalcified stone in the distal gallbladder.  Nonobstructive micro nephrolithiasis.  Diverticulosis without evidence of diverticulitis.  Left knee x-ray with no fracture or joint effusion but showing arthritic changes.  Left femur with no acute findings.  Portable pelvics was negative but rightward rotated.  Please see images and full regular report for further details.  ED course: Initial vital signs were temperature 98.2 F, pulse 69, respiration 18, BP 209/108 mmHg and O2 sat 95% on room air.  Patient received acetaminophen 650 mg p.o. x 1, aspirin 324 mg x 1, labetalol 20 mg IVP x 1, lorazepam 1 mg IVP x 1 metoprolol 100 mg p.o. x 1 and 2000 mL of normal saline bolus.   Review of Systems: As mentioned in the history of present illness. All other systems reviewed and are negative. Past Medical History:  Diagnosis Date   Anxiety 03/23/2022   Chronic kidney disease    Diabetes mellitus without complication (HCC)    Headache    Hyperlipidemia    Hypertension    Right hemiparesis (HCC)    Small bowel obstruction (HCC)    Stroke (HCC)    x2   Past Surgical History:  Procedure Laterality Date   BLADDER SURGERY     BOWEL RESECTION     EYE SURGERY     TUBAL LIGATION     Social History:  reports that  she has never smoked. She has never used smokeless tobacco. She reports that she does not drink alcohol and does not use drugs.  Allergies  Allergen Reactions   Codeine Other (See Comments)    Paranoid  Hallucinations    Colcrys [Colchicine] Nausea Only   Zyloprim [Allopurinol] Nausea Only   Nsaids Other (See Comments)    Stage 3 kidney disease    Family History  Problem Relation Age of Onset    Diabetes Mother    Heart disease Mother    Parkinsonism Father    Diabetes Sister    Diabetes Brother    Cancer Brother    Diabetes Maternal Grandmother     Prior to Admission medications   Medication Sig Start Date End Date Taking? Authorizing Provider  losartan (COZAAR) 25 MG tablet Take 25 mg by mouth at bedtime. 03/28/23  Yes [provider]  acetaminophen (TYLENOL) 325 MG tablet Take 650 mg by mouth 2 (two) times daily.    [provider]  amLODipine (NORVASC) 10 MG tablet Take 10 mg by mouth daily.    [provider]  dextromethorphan (DELSYM) 30 MG/5ML liquid Take 30 mg by mouth daily as needed for cough. Patient not taking: Reported on 10/13/2022    [provider]  dipyridamole-aspirin (AGGRENOX) 200-25 MG per 12 hr capsule Take 1 capsule by mouth 2 (two) times daily.    [provider]  insulin aspart (NOVOLOG FLEXPEN) 100 UNIT/ML FlexPen Inject 8 Units into the skin 3 (three) times daily with meals.    [provider]  insulin glargine (LANTUS) 100 UNIT/ML injection Inject 14 Units into the skin at bedtime.    [provider]  LORazepam (ATIVAN) 1 MG tablet Take 1 mg by mouth 2 (two) times daily.    [provider]  Menthol, Topical Analgesic, (ZIMS MAX-FREEZE EX) Apply 1 Application topically 2 (two) times daily as needed (pain). Patient not taking: Reported on 10/13/2022    [provider]  metoprolol (LOPRESSOR) 100 MG tablet Take 50-100 mg by mouth See admin instructions. 50 mg in the morning, 100 mg in the evening    [provider]  rosuvastatin (CRESTOR) 20 MG tablet Take 20 mg by mouth daily.    [provider]    Physical Exam: Vitals:   06/02/23 0638 06/02/23 0730 06/02/23 0745 06/02/23 0800  BP: 121/70 114/60 112/83   Pulse: (!) 117 (!) 123 (!) 121   Resp: (!) 23 17 14    Temp:    98.1 F (36.7 C)  TempSrc:    Oral  SpO2: 96% 96% 96%    Physical Exam Vitals and  nursing note reviewed.  Constitutional:      General: She is awake. She is not in acute distress.    Appearance: Normal appearance. She is overweight.  HENT:     Head: Normocephalic.     Nose: No rhinorrhea.     Mouth/Throat:     Mouth: Mucous membranes are moist.  Eyes:     General: No scleral icterus.    Pupils: Pupils are equal, round, and reactive to light.  Neck:     Vascular: No JVD.  Cardiovascular:     Rate and Rhythm: Normal rate. Rhythm irregular.     Heart sounds: S1 normal and S2 normal.  Pulmonary:     Effort: Pulmonary effort is normal.     Breath sounds: Normal breath sounds. No wheezing, rhonchi or rales.  Abdominal:     General:  Bowel sounds are normal.     Palpations: Abdomen is soft.     Tenderness: There is no abdominal tenderness.  Musculoskeletal:     Cervical back: Neck supple.     Right lower leg: No edema.     Left lower leg: No edema.  Skin:    General: Skin is warm and dry.  Neurological:     Mental Status: She is alert and oriented to person, place, and time. Mental status is at baseline.     Sensory: Sensory deficit present.     Motor: Weakness present.     Comments: Right-sided hemiparesis.  Psychiatric:        Mood and Affect: Mood normal.        Behavior: Behavior normal. Behavior is cooperative.    Data Reviewed:  Results are pending, will review when available. Transthoracic echocardiogram done today IMPRESSIONS:   1. Difficult strain acquisition; consider future PYP testing or CMR  testing. Left ventricular ejection fraction, by estimation, is 65 to 70%.  The left ventricle has normal function. The left ventricle has no regional  wall motion abnormalities. There is  severe asymmetric left ventricular hypertrophy of the basal-septal  segment. Left ventricular diastolic parameters are indeterminate.   2. Right ventricular systolic function is normal. The right ventricular  size is normal. Moderately increased right ventricular wall  thickness.  There is mildly elevated pulmonary artery systolic pressure. The estimated  right ventricular systolic pressure   is 38.0 mmHg.   3. A small pericardial effusion is present. The pericardial effusion is  circumferential.   4. The mitral valve is degenerative. Trivial mitral valve regurgitation.  The mean mitral valve gradient is 3.7 mmHg with average heart rate of 83  bpm. Moderate mitral annular calcification.   5. Tricuspid valve regurgitation is mild to moderate.   6. The aortic valve was not well visualized. There is moderate  calcification of the aortic valve. Aortic valve regurgitation is not  visualized. Aortic valve sclerosis is present, with no evidence of aortic  valve stenosis.   7. The inferior vena cava is normal in size with greater than 50%  respiratory variability, suggesting right atrial pressure of 3 mmHg.   EKG: Vent. rate 114 BPM PR interval * ms QRS duration 91 ms QT/QTcB 356/491 ms P-R-T axes * 30 246 Atrial fibrillation Ventricular premature complex Repol abnrm, severe global ischemia  Assessment and Plan: Principal Problem:   Essential hypertension Presenting with:   Hypertensive emergency Observation/stepdown. Switched from metoprolol to carvedilol by cardiology. Continue losartan 25 mg p.o. daily. Continue labetalol 10 mg every 2 hours as needed. Will resume low-dose amlodipine at 2.5 mg p.o. daily.  Active Problems:   Atrial fibrillation with RVR (HCC) Hold anticoagulation Continue beta-blocker twice daily. Keep electrolytes optimized. Cardiology consult appreciated. -Holding anticoagulation as patient wants daughter to be aware first. -Unable to communicate with the patient's daughter. -Dialed numbers with automated message saying not available.Marland Kitchen    HLD (hyperlipidemia) Continue rosuvastatin 20 mg p.o. daily.    Type 2 diabetes mellitus with other circulatory complications (HCC) Carbohydrate modified diet. CBG monitoring with  RI SS. Continue Lantus 14 units at bedtime or formulary equivalent. Check hemoglobin A1c.    Anxiety Continue lorazepam 0.5 mg p.o. daily.    Hypokalemia   Hypomagnesemia Replacing. Follow-up potassium level in AM.    Large hiatal hernia Has frequent symptoms. Will start PPI.    History of stroke with residual right-sided deficit Supportive care.    Closed  compression fracture of L1 vertebra (HCC)  Continue Cordyceps immobilization. Analgesics as needed. Consider physical therapy evaluation in the morning.   Advance Care Planning:   Code Status: Full Code   Consults: Cardiology (Dr. Merlyn Lot)  Family Communication:   Severity of Illness: The appropriate patient status for this patient is OBSERVATION. Observation status is judged to be reasonable and necessary in order to provide the required intensity of service to ensure the patient's safety. The patient's presenting symptoms, physical exam findings, and initial radiographic and laboratory data in the context of their medical condition is felt to place them at decreased risk for further clinical deterioration. Furthermore, it is anticipated that the patient will be medically stable for discharge from the hospital within 2 midnights of admission.   Author: Bobette Mo, MD 06/02/2023 9:06 AM  For on call review [email or web address].   This document was prepared using Dragon voice recognition software and may contain some unintended transcription errors.

## 2023-06-02 NOTE — ED Notes (Signed)
Pt's dtr called out concerned that pt was "acting different". Pt appeared to be having seizure with posturing, diaphoresis, and not answering questions. Provider called to bedside and orders placed and performed. Pt's cardiac monitor shows new A-Fib.

## 2023-06-02 NOTE — ED Notes (Signed)
Attempted report x1. Nurse unavailable.   Report given to [facility]

## 2023-06-02 NOTE — Consult Note (Signed)
Cardiology Consultation   Patient ID: Donna Green MRN: [id number]; DOB: 1941-05-18  Admit date: 06/01/2023 Date of Consult: 06/02/2023  PCP:  Renford Dills, MD   [facility] Providers Cardiologist:  None newto Dr. Izora Ribas  Patient Profile:   Donna Green is a 82 y.o. female with a hx of DM, HTN, HLD, CVA x 2 (2007, 2011), CKD IV, who is being seen 06/02/2023 for the evaluation of risks/benefits of anticoag at the request of Dr Robb Matar.  History of Present Illness:   Donna Green has not seen Cards before.   She was admitted 10/20 with HA after a fall. She was having episodes of rapid atrial fib.   Cards was asked to discuss the risks/benefits of anticoag w/ pt and hopefully her daughter as well.   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 She has a history of CKD stage four with a baseline creatinine of 1.98, poorly controlled diabetes with an A1c of 9.5 last year, and reasonably controlled hypertension on amlodipine and metoprolol. Her LDL has been well controlled under Crestor, with a recent LDL of 3. Despite this, she has experienced multiple strokes.  She denies experiencing symptoms of palpitations during episodes of atrial fibrillation. She also denies chest pain, but nursing staff have observed her clutching her chest and reporting chest pain. She has also experienced a significant fall and is currently dealing with back pain. She has not had bleeding issues in the past. On her Aggrenox.  Overall, she notes that save for her pain after the fall she feels well.  MD note: She is hard of hearing at baseline.    Past Medical History:  Diagnosis Date   Anxiety 03/23/2022   Chronic kidney disease    Diabetes mellitus without complication (HCC)    Headache    Hyperlipidemia    Hypertension    Right hemiparesis (HCC)     Small bowel obstruction (HCC)    Stroke (HCC)    x2    Past Surgical History:  Procedure Laterality Date   BLADDER SURGERY     BOWEL RESECTION     EYE SURGERY     TUBAL LIGATION         Inpatient Medications: Scheduled Meds:  Chlorhexidine Gluconate Cloth  6 each Topical Daily    HYDROmorphone (DILAUDID) injection  0.5 mg Intravenous Once   insulin aspart  0-9 Units Subcutaneous TID WC   Continuous Infusions:  magnesium sulfate bolus IVPB 2 g (06/02/23 0946)   PRN Meds: diltiazem, HYDROmorphone (DILAUDID) injection, mouth rinse  Allergies:    Allergies  Allergen Reactions   Codeine Other (See Comments)    Paranoid  Hallucinations    Colcrys [Colchicine] Nausea Only   Zyloprim [Allopurinol] Nausea Only   Nsaids Other (See Comments)    Stage 3 kidney disease    Social History:   Social History   Socioeconomic History   Marital status: Widowed    Spouse name: Not on file   Number of children: 2   Years of education: Not on file   Highest education level: Not on file  Occupational History   Occupation: retired   Tobacco Use   Smoking status: Never   Smokeless tobacco: Never  Substance and Sexual Activity   Alcohol use: No   Drug use: No   Sexual activity: Not on file  Other Topics Concern   Not  on file  Social History Narrative   Live with Daughter       1- 2 times a week    Social Determinants of Health   Financial Resource Strain: Not on file  Food Insecurity: No Food Insecurity (10/13/2022)   Hunger Vital Sign    Worried About Running Out of Food in the Last Year: Never true    Ran Out of Food in the Last Year: Never true  Transportation Needs: No Transportation Needs (10/13/2022)   PRAPARE - Administrator, Civil Service (Medical): No    Lack of Transportation (Non-Medical): No  Physical Activity: Not on file  Stress: Not on file  Social Connections: Not on file  Intimate Partner Violence: Not At Risk (10/13/2022)   Humiliation,  Afraid, Rape, and Kick questionnaire    Fear of Current or Ex-Partner: No    Emotionally Abused: No    Physically Abused: No    Sexually Abused: No    Family History:    Family History  Problem Relation Age of Onset   Diabetes Mother    Heart disease Mother    Parkinsonism Father    Diabetes Sister    Diabetes Brother    Cancer Brother    Diabetes Maternal Grandmother      ROS:  Please see the history of present illness.   Physical Exam/Data:   Vitals:   06/02/23 0638 06/02/23 0730 06/02/23 0745 06/02/23 0800  BP: 121/70 114/60 112/83   Pulse: (!) 117 (!) 123 (!) 121   Resp: (!) 23 17 14    Temp:    98.1 F (36.7 C)  TempSrc:    Oral  SpO2: 96% 96% 96%     Intake/Output Summary (Last 24 hours) at 06/02/2023 0954 Last data filed at 06/02/2023 4782 Gross per 24 hour  Intake 1000 ml  Output --  Net 1000 ml      03/22/2022    7:32 PM 03/07/2015    2:01 PM 12/04/2013   11:03 AM  Last 3 Weights  Weight (lbs) 182 lb 5.1 oz 182 lb 6.4 oz 181 lb 12.8 oz  Weight (kg) 82.7 kg 82.736 kg 82.464 kg     There is no height or weight on file to calculate BMI.  General:  Mild distress Neck: In orthopedic support Cardiac:  normal S1, S2; RRR; systolic murmur Lungs:  clear to auscultation bilaterally, no wheezing, rhonchi or rales  Abd: soft, nontender, no hepatomegaly  Ext: no edema Musculoskeletal:  No deformities, BUE and BLE strength normal and equal Skin: warm and dry  Neuro:  CNs 2-12 intact, no focal abnormalities noted Psych:  Normal affect   LABS HbA1c: 9.5% (2023) LDL: 3 mg/dL (9562)   DIAGNOSTIC EKG: Atrial fibrillation, no clear P wave morphology, artifact present (05/30/2023) EKG: Sinus rhythm, clear sinus P waves (06/01/2023) Telemetry: Atrial fibrillation with PVCs, spontaneous conversion to sinus rhythm at 9:02 AM (05/30/2023)  Relevant CV Studies:  ECHO: ordered with Strain added  Laboratory Data:  High Sensitivity Troponin:   Recent Labs  Lab  06/02/23 0132 06/02/23 0255 06/02/23 0519 06/02/23 0757  TROPONINIHS 109* 104* 105* 92*     Chemistry Recent Labs  Lab 06/02/23 0132 06/02/23 0519  NA 141 140  K 3.9 3.3*  CL 104 107  CO2 26 21*  GLUCOSE 218* 216*  BUN 29* 29*  CREATININE 2.09* 1.98*  CALCIUM 8.9 8.3*  MG  --  1.1*  GFRNONAA 23* 25*  ANIONGAP 11 12  Recent Labs  Lab 06/02/23 0519  PROT 6.4*  ALBUMIN 3.3*  AST 19  ALT 11  ALKPHOS 42  BILITOT 0.4   Lipids No results for input(s): "CHOL", "TRIG", "HDL", "LABVLDL", "LDLCALC", "CHOLHDL" in the last 168 hours.  Hematology Recent Labs  Lab 06/02/23 0132 06/02/23 0519  WBC 8.7 7.5  RBC 3.84* 3.44*  HGB 12.8 11.6*  HCT 39.1 35.0*  MCV 101.8* 101.7*  MCH 33.3 33.7  MCHC 32.7 33.1  RDW 13.7 13.8  PLT 237 212   Thyroid No results for input(s): "TSH", "FREET4" in the last 168 hours.  BNPNo results for input(s): "BNP", "PROBNP" in the last 168 hours.  DDimer No results for input(s): "DDIMER" in the last 168 hours.   Radiology/Studies:  DG Pelvis Portable  Result Date: 06/02/2023 CLINICAL DATA:  Pain after fall. EXAM: PORTABLE PELVIS 1 VIEWS COMPARISON:  None Available. FINDINGS: Limited rightward rotated pelvis. No evidence of fracture involving the hips or pelvis. Generalized osteopenia and extensive atheromatous calcification. IMPRESSION: Negative rotated pelvis. Electronically Signed   By: Tiburcio Pea M.D.   On: 06/02/2023 07:38   DG Femur Portable Min 2 Views Left  Result Date: 06/02/2023 CLINICAL DATA:  Pain after fall. EXAM: LEFT FEMUR PORTABLE 2 VIEWS COMPARISON:  07/19/2017 FINDINGS: There is no evidence of fracture or other focal bone lesions. A small generalized osteopenia and atheromatous calcification. IMPRESSION: No acute finding. Electronically Signed   By: Tiburcio Pea M.D.   On: 06/02/2023 07:34   DG Knee Complete 4 Views Left  Result Date: 06/02/2023 CLINICAL DATA:  Fall with left knee injury. EXAM: LEFT KNEE - COMPLETE  4+ VIEW COMPARISON:  Study of 01/24/2007 FINDINGS: Interval progressive bone density loss is noted as well as marked interval progression of vascular calcifications. No fracture is evident. No joint effusion is seen. There is moderate tricompartmental joint space loss with small tricompartmental marginal osteophytes and meniscal chondrocalcinosis. Superficial soft tissues show no acute abnormality. No visible foreign body. IMPRESSION: 1. Interval progressive bone density loss and marked progression of diffuse vascular calcifications. 2. No fracture or joint effusion. 3. Tricompartmental degenerative changes with meniscal chondrocalcinosis. Electronically Signed   By: Almira Bar M.D.   On: 06/02/2023 02:42   DG Chest 2 View  Result Date: 06/02/2023 CLINICAL DATA:  Short fall down steps at home with landing on the patient's back. EXAM: CHEST - 2 VIEW COMPARISON:  Portable chest 03/22/2022 FINDINGS: Large hiatal hernia is again noted eccentric to the right. Mild cardiomegaly. The mediastinum is stable, with aortic uncoiling and calcific plaques. No vascular congestion is seen. The lungs are clear. There is no substantial pleural effusion. Osteopenia with mild thoracic kyphodextroscoliosis. IMPRESSION: No evidence of acute chest injury or pneumothorax. Large hiatal hernia. Cardiomegaly and aortic atherosclerosis. No radiographic interval change. Electronically Signed   By: Almira Bar M.D.   On: 06/02/2023 02:38   CT L-SPINE NO CHARGE  Result Date: 06/02/2023 CLINICAL DATA:  Low back injury. Short fall down steps at home. Landed on her back. EXAM: CT LUMBAR SPINE WITHOUT CONTRAST TECHNIQUE: Multidetector CT imaging of the lumbar spine was performed without intravenous contrast administration. Multiplanar CT image reconstructions were also generated. RADIATION DOSE REDUCTION: This exam was performed according to the departmental dose-optimization program which includes automated exposure control,  adjustment of the mA and/or kV according to patient size and/or use of iterative reconstruction technique. COMPARISON:  CT abdomen and pelvis and reconstructions 03/22/2022. FINDINGS: Segmentation: 5 lumbar type vertebrae. Alignment: No new abnormality.  Again noted is a slight dextroscoliosis, a trace chronic degenerative retrolisthesis at L2-3, and a 1 cm chronic grade 1 anterolisthesis related to chronic disc collapse and DJD at L5-S1. Vertebrae: Osteopenia. There is a minimal new concave compression deformity of the L1 upper plate and a slight posterior bowing of the dorsal vertebral cortex. This could be acute and is certainly new from 03/22/2022, but could be subacute as there is linear sclerosis in portions of the vertebral body which may be seen with compaction of trabecular elements with an acute injury, or with a healing response due to a subacute injury. No other new injury is seen. There are mild chronic upper plate anterior wedge compression fractures of the T12 and L4 vertebral bodies and a chronic mild lower plate anterior wedge compression fracture of L2. There is lumbar spondylosis. Chronic moderate reactive endplate sclerosis opposite L5-S1. Prominent superior endplate Schmorl's node formation T12 also chronic. All pedicles and posterior elements are intact, with spinous process abutment L3-5 with opposing surface spurring. No primary pathologic process is suspected. In addition there is no sacral insufficiency fracture. Paraspinal and other soft tissues: No paraspinal mass or hematoma. Slight paravertebral edema at L1. Disc levels: The lumbar disc spaces are normal in height except for a chronic advanced collapse of L5-S1, with vacuum phenomenon. At T12-L1 and L1-2 there is no significant soft tissue or bony encroachment on the thecal sac and foramina. At L2-3, there is mild spinal canal stenosis due to posterior disc osteophyte complex and facet joint spurring change, bilateral mild to moderate  acquired foraminal stenosis. At L3-4, there is mild-to-moderate spinal canal stenosis due to dorsal ligamentous thickening, posterior disc osteophyte complex and moderate facet osteophytes. There are bilateral mild-to-moderate foraminal stenosis. At L4-5, there is a mild nonstenosing posterior disc bulge, with effacement in the lateral recesses due to moderate facet hypertrophy. Due to foraminal disc bulging there is moderate left and mild right foraminal stenosis. At L5-S1, chronic disc collapse, vacuum phenomenon is noted, and a small right paracentral calcified disc extrusion. Due to the anterolisthesis there is moderate spinal canal stenosis and severe bilateral foraminal stenosis. Advanced facet hypertrophy. IMPRESSION: 1. Minimal new concave compression deformity of the L1 upper plate with slight posterior bowing of the dorsal vertebral cortex. This could be acute or subacute. There is only mild paravertebral edema and no paravertebral hematoma. 2. No other recent fractures are seen. Chronic compression fractures described above. 3. Osteopenia, degenerative and chronic changes. 4. Spinous process abutment L3-5 with opposing surface spurring. 5. No new abnormal alignment. 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 6. Remaining detailed findings discussed above. Electronically Signed   By: Almira Bar M.D.   On: 06/02/2023 02:35   CT ABDOMEN PELVIS WO CONTRAST  Result Date: 06/02/2023 CLINICAL DATA:  Short fall down stairs at home landing on her back, with polytrauma to the abdomen and back. 82 year old female. EXAM: CT ABDOMEN AND PELVIS WITHOUT CONTRAST TECHNIQUE: Multidetector CT imaging of the abdomen and pelvis was performed following the standard protocol without IV contrast. RADIATION DOSE REDUCTION: This exam was performed according to the departmental dose-optimization program which includes automated exposure control, adjustment of the mA and/or kV according to  patient size and/or use of iterative reconstruction technique. COMPARISON:  CT abdomen and pelvis no contrast 03/14/2022 FINDINGS: Lower chest: Large hiatal hernia with inverted intrathoracic stomach partially visible. Small chronic pericardial effusion. Cardiac size is normal. There are coronary artery calcifications. Mild subpleural reticulation both lung bases  and calcified right middle lobe granuloma without acute process. Hepatobiliary: There is either sludge or 1 or more noncalcified stones in the distal gallbladder lumen. No wall thickening or bile duct dilatation. No focal liver abnormality is seen without contrast. Pancreas: Unremarkable without contrast. Spleen: Unremarkable without contrast. Adrenals/Urinary Tract: There is no adrenal mass. Right renal volume loss and patchy cortical thinning again noted. Bilateral Bosniak 1 cysts, largest on the left is posteriorly measuring 2.5 cm. Largest on the right is inferior measuring 4 cm. No other contour deforming abnormalities seen with limited evaluation without contrast. There is a punctate nonobstructive caliceal stone in the midpole of the right kidney, another is seen in the midpole left. There are no obstructing stones or hydronephrosis. There is no bladder thickening. Stomach/Bowel: No dilatation or wall thickening including the appendix. Sigmoid diverticulosis noted without evidence of diverticulitis. Vascular/Lymphatic: Moderate aortoiliac and visceral abdominal arterial calcific plaque. Scattered vascular calcification at the renal hila. Multiple left gonadal vein phleboliths.  Pelvic phleboliths. No aneurysm is seen.  No adenopathy. Reproductive: Status post hysterectomy. No adnexal masses. Other: No abdominal wall hernia or abnormality. No abdominopelvic ascites. There is no free air or free hemorrhage. Musculoskeletal: Osteopenia. Moderate chronic wedge compression fracture T10. Mild chronic lower plate wedge compression fracture L2 and slight  upper plate chronic compression fractures are again noted of T12 and L4. At L1 there is a slight increased concave appearance in the superior endplate consistent with an interval new mild compression fracture, without significant anterior or posterior height loss but with slight posterior bowing of the dorsal vertebral cortex. This could be acute and is new from August 2023, but could be subacute given the appearance of scattered linear sclerosis in the vertebral body, which would either represent compaction of trabecular elements or a healing response. There is no paraspinal hematoma. The pedicles and posterior elements are intact. No other visible regional skeletal fracture. There is advanced degenerative change L5-S1 with reactive endplate sclerosis and chronic grade 1 degenerative spondylolisthesis. IMPRESSION: 1. Mild L1 superior endplate central compression fracture with slight posterior bowing of the dorsal vertebral cortex but no significant anterior or posterior height loss. This could be acute and is new from August 2023, but could be subacute given the appearance of scattered linear sclerosis in the vertebral body. 2. No other acute trauma related findings in the abdomen or pelvis. 3. Large hiatal hernia with inverted intrathoracic stomach. 4. Aortic and coronary artery atherosclerosis. 5. Sludge or noncalcified stones in the distal gallbladder. 6. Nonobstructive micronephrolithiasis. 7. Diverticulosis without evidence of diverticulitis. Aortic Atherosclerosis (ICD10-I70.0). Electronically Signed   By: Almira Bar M.D.   On: 06/02/2023 02:17   CT Cervical Spine Wo Contrast  Result Date: 06/02/2023 CLINICAL DATA:  Neck trauma. Patient fell down 2 steps, landing on the back. Dizziness and headache. EXAM: CT CERVICAL SPINE WITHOUT CONTRAST TECHNIQUE: Multidetector CT imaging of the cervical spine was performed without intravenous contrast. Multiplanar CT image reconstructions were also generated.  RADIATION DOSE REDUCTION: This exam was performed according to the departmental dose-optimization program which includes automated exposure control, adjustment of the mA and/or kV according to patient size and/or use of iterative reconstruction technique. COMPARISON:  None Available. FINDINGS: Alignment: Normal alignment. Skull base and vertebrae: No acute fracture. No primary bone lesion or focal pathologic process. Soft tissues and spinal canal: No prevertebral fluid or swelling. No visible canal hematoma. Disc levels: Degenerative changes with disc space narrowing and endplate osteophyte formation throughout. Multilevel degenerative disc changes with vacuum  phenomenon. Degenerative changes in the posterior facet joint. Posterior disc osteophyte complexes cause encroachment upon the anterior central canal at multiple levels. Upper chest: Lung apices are clear. Other: None. IMPRESSION: 1. Normal alignment.  No acute displaced fractures identified. 2. Prominent degenerative changes throughout the cervical spine. Electronically Signed   By: Burman Nieves M.D.   On: 06/02/2023 01:54   CT Head Wo Contrast  Result Date: 06/02/2023 CLINICAL DATA:  Head trauma. No focal neurological findings. Patient fell backwards down 2 steps, striking the back of the head. Dizziness and slight headache. EXAM: CT HEAD WITHOUT CONTRAST TECHNIQUE: Contiguous axial images were obtained from the base of the skull through the vertex without intravenous contrast. RADIATION DOSE REDUCTION: This exam was performed according to the departmental dose-optimization program which includes automated exposure control, adjustment of the mA and/or kV according to patient size and/or use of iterative reconstruction technique. COMPARISON:  03/22/2021 FINDINGS: Brain: Diffuse cerebral atrophy. Ventricular dilatation consistent with central atrophy. Low-attenuation changes in the deep white matter consistent with small vessel ischemia. No abnormal  extra-axial fluid collections. No mass effect or midline shift. Gray-white matter junctions are distinct. Basal cisterns are not effaced. No acute intracranial hemorrhage. Vascular: No hyperdense vessel or unexpected calcification. Skull: Normal. Negative for fracture or focal lesion. Sinuses/Orbits: Mucosal thickening in the paranasal sinuses. No acute air-fluid levels. Mastoid air cells are clear. Other: None. IMPRESSION: No acute intracranial abnormalities. Chronic atrophy and small vessel ischemic changes. Electronically Signed   By: Burman Nieves M.D.   On: 06/02/2023 01:51     Assessment and Plan:   New Onset Atrial Fibrillation (PAF) - Noted on telemetry and EKG. High CHADS-VASc score (7) due to prior strokes, hypertension, diabetes, and age. Currently in sinus rhythm. -Once cleared for anticoagulation post-fall, initiate Eliquis 2.5mg  PO BID (creatinine, age) -Order echocardiogram with strain imaging to assess for potential AL cardiac amyloidosis due to history of monoclonal gammopathy of unknown significance. - BB as below - a TSH is ordered and pending  Hypertensive Emergency - In the setting of new onset atrial fibrillation. -Use non-nephrotoxic agents to manage blood pressure. _ I have switched from metoprolol to coreg; she has a precarious drip in her BP ~ 240 - 9- mm Hg after IV hydralazine and labetalol; TRH working to slowing decrease blood pressure given this and her stroke risk  Chronic Kidney Disease (Stage 4) - Baseline creatinine around 1.98. -Continue monitoring renal function.  Poorly Controlled Diabetes Last A1c was 9.2. -Continue current management and monitor blood glucose levels.  Hyperlipidemia Well controlled on Crestor, last LDL was 3. -Continue Crestor.  Monoclonal Gammopathy of Unknown Significance Increased risk for AL cardiac amyloidosis. -Echo imaging as above   For questions or updates, please contact [facility] Please consult  [email or web address] for contact info under    Signed, Theodore Demark, PA-C  06/02/2023 9:54 AM

## 2023-06-02 NOTE — ED Notes (Signed)
Pt still in radiology.

## 2023-06-02 NOTE — ED Notes (Signed)
Pt is very tremulous and reports she is anxious and tired.

## 2023-06-02 NOTE — Progress Notes (Addendum)
Plan of Care Note for accepted transfer   Patient: Donna Green MRN: [id number]   DOA: 06/01/2023  Facility requesting transfer: [facility]   Requesting Provider: Dr. Manus Gunning   Reason for transfer: Hypertensive crisis   Facility course: 82 year old female with history of HTN, HLD, DM, CVA, MGUS, and CKD 4 who presents with headache and low back pain after losing her footing and falling back.  BP was as high as 244/105.  Labs were most notable for creatinine 2.09 and troponin of 109 and then 104.  Imaging reveals mild L1 compression fracture.  She is not having chest pain.  She was treated with acetaminophen, 324 mg of aspirin, her evening dose of Lopressor, IV labetalol, and IV hydralazine.  ADDENDUM: BP dropped precipitously to 90 systolic after IV hydralazine and IV labetalol and she went into atrial fibrillation with rate 110s-120s. BP is improving with SBP now 110.    Plan of care: The patient is accepted for admission to [facility] unit, at [facility].   Author: Briscoe Deutscher, MD 06/02/2023  Check [email or web address] for on-call coverage.  Nursing staff, Please call [facility] Admits & Consults System-Wide number on Amion as soon as patient's arrival, so appropriate admitting provider can evaluate the pt.

## 2023-06-02 NOTE — ED Notes (Signed)
Report given to Inpatient nurse.

## 2023-06-02 NOTE — ED Notes (Signed)
Pt's dtr, Daphne, left to go home to change cars but will return ASAP. Contact #'s are: [phone] and [phone]

## 2023-06-02 NOTE — ED Notes (Signed)
Pt complaining of nausea.   

## 2023-06-03 DIAGNOSIS — R001 Bradycardia, unspecified: Secondary | ICD-10-CM | POA: Diagnosis present

## 2023-06-03 DIAGNOSIS — N184 Chronic kidney disease, stage 4 (severe): Secondary | ICD-10-CM | POA: Diagnosis present

## 2023-06-03 DIAGNOSIS — W010XXA Fall on same level from slipping, tripping and stumbling without subsequent striking against object, initial encounter: Secondary | ICD-10-CM | POA: Diagnosis present

## 2023-06-03 DIAGNOSIS — R7989 Other specified abnormal findings of blood chemistry: Secondary | ICD-10-CM | POA: Diagnosis present

## 2023-06-03 DIAGNOSIS — G9341 Metabolic encephalopathy: Secondary | ICD-10-CM | POA: Diagnosis present

## 2023-06-03 DIAGNOSIS — Z515 Encounter for palliative care: Secondary | ICD-10-CM | POA: Diagnosis not present

## 2023-06-03 DIAGNOSIS — Z7189 Other specified counseling: Secondary | ICD-10-CM | POA: Diagnosis not present

## 2023-06-03 DIAGNOSIS — E1165 Type 2 diabetes mellitus with hyperglycemia: Secondary | ICD-10-CM | POA: Diagnosis present

## 2023-06-03 DIAGNOSIS — M4856XA Collapsed vertebra, not elsewhere classified, lumbar region, initial encounter for fracture: Secondary | ICD-10-CM | POA: Diagnosis present

## 2023-06-03 DIAGNOSIS — I5A Non-ischemic myocardial injury (non-traumatic): Secondary | ICD-10-CM | POA: Diagnosis present

## 2023-06-03 DIAGNOSIS — I43 Cardiomyopathy in diseases classified elsewhere: Secondary | ICD-10-CM | POA: Diagnosis present

## 2023-06-03 DIAGNOSIS — I161 Hypertensive emergency: Secondary | ICD-10-CM | POA: Diagnosis present

## 2023-06-03 DIAGNOSIS — F05 Delirium due to known physiological condition: Secondary | ICD-10-CM | POA: Diagnosis present

## 2023-06-03 DIAGNOSIS — I129 Hypertensive chronic kidney disease with stage 1 through stage 4 chronic kidney disease, or unspecified chronic kidney disease: Secondary | ICD-10-CM | POA: Diagnosis present

## 2023-06-03 DIAGNOSIS — I361 Nonrheumatic tricuspid (valve) insufficiency: Secondary | ICD-10-CM | POA: Diagnosis present

## 2023-06-03 DIAGNOSIS — D539 Nutritional anemia, unspecified: Secondary | ICD-10-CM | POA: Diagnosis present

## 2023-06-03 DIAGNOSIS — Z794 Long term (current) use of insulin: Secondary | ICD-10-CM | POA: Diagnosis not present

## 2023-06-03 DIAGNOSIS — I4891 Unspecified atrial fibrillation: Secondary | ICD-10-CM | POA: Diagnosis not present

## 2023-06-03 DIAGNOSIS — I169 Hypertensive crisis, unspecified: Secondary | ICD-10-CM | POA: Diagnosis not present

## 2023-06-03 DIAGNOSIS — F0394 Unspecified dementia, unspecified severity, with anxiety: Secondary | ICD-10-CM | POA: Diagnosis present

## 2023-06-03 DIAGNOSIS — E1122 Type 2 diabetes mellitus with diabetic chronic kidney disease: Secondary | ICD-10-CM | POA: Diagnosis present

## 2023-06-03 DIAGNOSIS — I480 Paroxysmal atrial fibrillation: Secondary | ICD-10-CM | POA: Diagnosis present

## 2023-06-03 DIAGNOSIS — W19XXXA Unspecified fall, initial encounter: Secondary | ICD-10-CM | POA: Diagnosis not present

## 2023-06-03 DIAGNOSIS — E1159 Type 2 diabetes mellitus with other circulatory complications: Secondary | ICD-10-CM | POA: Diagnosis present

## 2023-06-03 DIAGNOSIS — K449 Diaphragmatic hernia without obstruction or gangrene: Secondary | ICD-10-CM | POA: Diagnosis present

## 2023-06-03 DIAGNOSIS — R509 Fever, unspecified: Secondary | ICD-10-CM | POA: Diagnosis present

## 2023-06-03 DIAGNOSIS — E8721 Acute metabolic acidosis: Secondary | ICD-10-CM | POA: Diagnosis present

## 2023-06-03 DIAGNOSIS — D472 Monoclonal gammopathy: Secondary | ICD-10-CM | POA: Diagnosis present

## 2023-06-03 DIAGNOSIS — I69351 Hemiplegia and hemiparesis following cerebral infarction affecting right dominant side: Secondary | ICD-10-CM | POA: Diagnosis not present

## 2023-06-03 DIAGNOSIS — I3139 Other pericardial effusion (noninflammatory): Secondary | ICD-10-CM | POA: Diagnosis present

## 2023-06-03 LAB — BASIC METABOLIC PANEL
Anion gap: 10 (ref 5–15)
BUN: 31 mg/dL — ABNORMAL HIGH (ref 8–23)
CO2: 18 mmol/L — ABNORMAL LOW (ref 22–32)
Calcium: 7.9 mg/dL — ABNORMAL LOW (ref 8.9–10.3)
Chloride: 109 mmol/L (ref 98–111)
Creatinine, Ser: 2.09 mg/dL — ABNORMAL HIGH (ref 0.44–1.00)
GFR, Estimated: 23 mL/min — ABNORMAL LOW (ref >60)
Glucose, Bld: 204 mg/dL — ABNORMAL HIGH (ref 70–99)
Potassium: 3.7 mmol/L (ref 3.5–5.1)
Sodium: 137 mmol/L (ref 135–145)

## 2023-06-03 LAB — CBC WITH DIFFERENTIAL/PLATELET
Abs Immature Granulocytes: 0.04 10*3/uL (ref 0.00–0.07)
Basophils Absolute: 0 10*3/uL (ref 0.0–0.1)
Basophils Relative: 0 %
Eosinophils Absolute: 0 10*3/uL (ref 0.0–0.5)
Eosinophils Relative: 0 %
HCT: 35.9 % — ABNORMAL LOW (ref 36.0–46.0)
Hemoglobin: 11.3 g/dL — ABNORMAL LOW (ref 12.0–15.0)
Immature Granulocytes: 1 %
Lymphocytes Relative: 10 %
Lymphs Abs: 0.8 10*3/uL (ref 0.7–4.0)
MCH: 33.7 pg (ref 26.0–34.0)
MCHC: 31.5 g/dL (ref 30.0–36.0)
MCV: 107.2 fL — ABNORMAL HIGH (ref 80.0–100.0)
Monocytes Absolute: 0.6 10*3/uL (ref 0.1–1.0)
Monocytes Relative: 8 %
Neutro Abs: 6.4 10*3/uL (ref 1.7–7.7)
Neutrophils Relative %: 81 %
Platelets: 162 10*3/uL (ref 150–400)
RBC: 3.35 MIL/uL — ABNORMAL LOW (ref 3.87–5.11)
RDW: 14.2 % (ref 11.5–15.5)
WBC: 7.8 10*3/uL (ref 4.0–10.5)
nRBC: 0 % (ref 0.0–0.2)

## 2023-06-03 LAB — MAGNESIUM: Magnesium: 1.7 mg/dL (ref 1.7–2.4)

## 2023-06-03 LAB — HEMOGLOBIN A1C
Hgb A1c MFr Bld: 7.7 % — ABNORMAL HIGH (ref 4.8–5.6)
Mean Plasma Glucose: 174.29 mg/dL

## 2023-06-03 LAB — GLUCOSE, CAPILLARY
Glucose-Capillary: 194 mg/dL — ABNORMAL HIGH (ref 70–99)
Glucose-Capillary: 121 mg/dL — ABNORMAL HIGH (ref 70–99)
Glucose-Capillary: 117 mg/dL — ABNORMAL HIGH (ref 70–99)
Glucose-Capillary: 136 mg/dL — ABNORMAL HIGH (ref 70–99)

## 2023-06-03 MED ORDER — ENSURE ENLIVE PO LIQD
237.0000 mL | Freq: Two times a day (BID) | ORAL | Status: DC
  Administered 2023-06-06 – 2023-06-10 (×10): 237 mL via ORAL

## 2023-06-03 MED ORDER — PANTOPRAZOLE SODIUM 40 MG IV SOLR
40.0000 mg | INTRAVENOUS | Status: DC
  Administered 2023-06-03: 40 mg via INTRAVENOUS
  Filled 2023-06-03: qty 10

## 2023-06-03 MED ORDER — ENOXAPARIN SODIUM 80 MG/0.8ML IJ SOSY
75.0000 mg | PREFILLED_SYRINGE | INTRAMUSCULAR | Status: DC
  Administered 2023-06-03: 75 mg via SUBCUTANEOUS
  Filled 2023-06-03: qty 0.8

## 2023-06-03 MED ORDER — AMLODIPINE BESYLATE 5 MG PO TABS
5.0000 mg | ORAL_TABLET | Freq: Every day | ORAL | Status: DC
  Administered 2023-06-03 – 2023-06-06 (×4): 5 mg via ORAL
  Filled 2023-06-03 (×4): qty 1

## 2023-06-03 MED ORDER — METOPROLOL TARTRATE 5 MG/5ML IV SOLN
5.0000 mg | Freq: Four times a day (QID) | INTRAVENOUS | Status: DC
  Administered 2023-06-03 – 2023-06-04 (×4): 5 mg via INTRAVENOUS
  Filled 2023-06-03 (×4): qty 5

## 2023-06-03 MED ORDER — LORAZEPAM 2 MG/ML IJ SOLN
0.5000 mg | Freq: Four times a day (QID) | INTRAMUSCULAR | Status: DC | PRN
  Administered 2023-06-03: 0.5 mg via INTRAVENOUS
  Filled 2023-06-03 (×2): qty 1

## 2023-06-03 NOTE — Progress Notes (Signed)
Overnight patient was restless and confused per nightshift RN. Per report, patient continuously removed monitoring cables and was unable to rest throughout the night. See MAR for interventions. Patient is comfortably resting at this time, without pulse-ox cable since she removed it. Patient's breathing is unlabored, with respirations 16/min on 2L [place] at this time. RN will allow patient to rest a while this morning, and will replace monitoring equipment at a later time.

## 2023-06-03 NOTE — Progress Notes (Signed)
PHARMACY - ANTICOAGULATION CONSULT NOTE  Pharmacy Consult for Lovenox Indication: atrial fibrillation  Allergies  Allergen Reactions   Codeine Other (See Comments)    Paranoid  Hallucinations    Colcrys [Colchicine] Nausea Only   Zyloprim [Allopurinol] Nausea Only   Nsaids Other (See Comments)    Stage 3 kidney disease    Patient Measurements: Height: 5\' 5"  (165.1 cm) Weight: 75.6 kg (166 lb 10.7 oz) IBW/kg (Calculated) : 57  Vital Signs: Temp: 97.2 F (36.2 C) (10/21 0800) Temp Source: Axillary (10/21 0800) BP: 143/87 (10/21 0816) Pulse Rate: 91 (10/21 0826)  Labs: Recent Labs    06/02/23 0132 06/02/23 0255 06/02/23 0519 06/02/23 0757 06/03/23 0757  HGB 12.8  --  11.6*  --  11.3*  HCT 39.1  --  35.0*  --  35.9*  PLT 237  --  212  --  162  CREATININE 2.09*  --  1.98*  --  2.09*  TROPONINIHS 109* 104* 105* 92*  --     Estimated Creatinine Clearance: 20.7 mL/min (A) (by C-G formula based on SCr of 2.09 mg/dL (H)).   Medical History: Past Medical History:  Diagnosis Date   Anxiety 03/23/2022   Chronic kidney disease    Diabetes mellitus without complication (HCC)    Headache    Hyperlipidemia    Hypertension    Right hemiparesis (HCC)    Small bowel obstruction (HCC)    Stroke [facility])    x2   Assessment: Active Problem(s): Fall, landing on her back and hitting the back of her head. Afib, head CT negative  AC/Heme: NEW Afib, with h/o CVA x 2. Lovenox 1mg /kg/d for CrCl<30. Cannot currently take po's due to agitation, CHADS-VASc score (7)  - Hgb 11.3 slowly trending down, Plts 162 trending down.  Goal of Therapy:  Anti-Xa level 0.6-1 units/ml 4hrs after LMWH dose given Monitor platelets by anticoagulation protocol: Yes   Plan:  Lovenox 75mg  SQ q24h CBC q72h while on LMWH  Eliquis when cleared from fall for afib.   Larena Ohnemus S. Merilynn Finland, PharmD, BCPS Clinical Staff Pharmacist [email or web address] Merilynn Finland, [employer] 06/03/2023,8:54 AM

## 2023-06-03 NOTE — Progress Notes (Addendum)
Patient Name: Donna Green Date of Encounter: 06/03/2023 [facility] Cardiologist: Christell Constant, MD   Interval Summary  .    Patient severely agitated last night requiring mittens.  She is not oriented to time or place and does not recall having a daughter.  She seems uncomfortable however unable to articulate any further.  Vital Signs .    Vitals:   06/03/23 0615 06/03/23 0630 06/03/23 0645 06/03/23 0700  BP: (!) 143/60 (!) 137/58 (!) 118/56 (!) 134/45  Pulse:      Resp: 14 12 18 18   Temp:      TempSrc:      SpO2:      Weight:      Height:       No intake or output data in the 24 hours ending 06/03/23 0807    06/02/2023   11:00 AM 03/22/2022    7:32 PM 03/07/2015    2:01 PM  Last 3 Weights  Weight (lbs) 166 lb 10.7 oz 182 lb 5.1 oz 182 lb 6.4 oz  Weight (kg) 75.6 kg 82.7 kg 82.736 kg      Telemetry/ECG    Appears generally to be normal sinus rhythm with heart rates in the 90s.  Does appear that she might be in WAP.  Also looks like she might have had some fusion beats.- Personally Reviewed  CV Studies    Echocardiogram 06/02/2023 1. Difficult strain acquisition; consider future PYP testing or CMR  testing. Left ventricular ejection fraction, by estimation, is 65 to 70%.  The left ventricle has normal function. The left ventricle has no regional  wall motion abnormalities. There is  severe asymmetric left ventricular hypertrophy of the basal-septal  segment. Left ventricular diastolic parameters are indeterminate.   2. Right ventricular systolic function is normal. The right ventricular  size is normal. Moderately increased right ventricular wall thickness.  There is mildly elevated pulmonary artery systolic pressure. The estimated  right ventricular systolic pressure   is 38.0 mmHg.   3. A small pericardial effusion is present. The pericardial effusion is  circumferential.   4. The mitral valve is degenerative. Trivial mitral valve  regurgitation.  The mean mitral valve gradient is 3.7 mmHg with average heart rate of 83  bpm. Moderate mitral annular calcification.   5. Tricuspid valve regurgitation is mild to moderate.   6. The aortic valve was not well visualized. There is moderate  calcification of the aortic valve. Aortic valve regurgitation is not  visualized. Aortic valve sclerosis is present, with no evidence of aortic  valve stenosis.   7. The inferior vena cava is normal in size with greater than 50%  respiratory variability, suggesting right atrial pressure of 3 mmHg.   Physical Exam .   GEN: No acute distress.   Neck: No JVD Cardiac: RRR, no murmurs, rubs, or gallops.  Respiratory: Clear to auscultation bilaterally. GI: Soft, nontender, non-distended  MS: No edema  Patient Profile    Donna Green is a 82 y.o. female has hx of   DM, HTN, HLD, CVA x 2 (2007, 2011), CKD IV,  and admitted on 06/02/2023 for the evaluation of afib after sustaining a fall from home.  Assessment & Plan .     New onset atrial fibrillation Appears to have had a brief episode in the setting of a fall converted to normal sinus rhythm spontaneously yesterday.  She has been maintaining normal sinus rhythm however seems to be in WAP at times.  Anticoagulation was initially deferred due to trauma fall and potential conversations with her daughter.  Will discuss with primary team if okay to start. Continue Eliquis 2.5 mg twice daily when able to do so and carvedilol 12.5 mg twice daily.  Currently due to AMS patient not tolerating p.o. intake.  Will discuss with pharmacy anticoagulation and transition to IV BB for now.  Echocardiogram showed normal EF 65 to 70%.  She has normal TSH.  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  Recommendations at that point was a yearly electrophoresis.  Given AMS do not have any further details of this.   Echocardiogram did show normal EF 65 to 70% but had severe asymmetric LVH of the basal septal segment.  Normal RV function and mildly elevated RVSP 38.  Small circumferential pericardial effusion.  Discussed with MD options of cardiac MRI versus PYP scan outpatient.  Will pursue outpatient PYP scan given renal dysfunction and lack of inability to tolerate procedure. Should probably also get reestablished with oncology too.   Elevated trops Trops flat in the low 100s. Likely demand from fall and afib. Not in the pattern of ACS.   Hypertensive emergency She has had much improved blood pressures now but were initially in the 240s.  Currently she is maintaining 140s systolic. Continue carvedilol 12.5 mg twice daily. Given CKD stage IV with a GFR of 24.  Discontinuing her losartan.  Will increase amlodipine from 2.5 mg to 5 mg daily when she can take PO.   CKD stage 4 Cr 2.09  HLD Continue rosuvastatin 20mg . 03/2022 LDL 3.    Mild to moderate TR Moderate aortic valve calcifications Moderate MAC Continue to monitor outpatient.  For questions or updates, please contact [facility] Please consult [email or web address] for contact info under        Signed, Abagail Kitchens, PA-C   I have seen and examined the patient along with Abagail Kitchens, PA-C .  I have reviewed the chart, notes and new data.  I agree with PA/NP's note.  Key new complaints: Currently sedated for agitation Key examination changes: Lying fully supine in bed without any evidence of respiratory difficulty/tachypnea.  Blood pressure is mildly elevated.  Lungs are clear. Key new findings / data: Reviewed the echocardiogram.  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  The echo does show clear evidence of elevated mean left atrial pressure, while PAP is only mildly elevated.   Kappa:lambda ratio was only very  mildly elevated on IFE performed at the beginning of this year.  PLAN: She is clearly not able to cooperate with cardiac MRI.  Also concerned about the use of gadolinium contrast with her volatile renal function.  Will plan outpatient MRI or pyrophosphate scan to evaluate for amyloidosis. Meanwhile focus on good blood pressure control. Transition from Lovenox to Eliquis when there is clearly no plan for invasive procedures/surgery and when she is able to take p.o. medications.  The dose would be Eliquis 2.5 mg twice daily due to advanced age and renal dysfunction.  Thurmon Fair, MD, [facility] [phone] 06/03/2023, 9:38 AM

## 2023-06-03 NOTE — Progress Notes (Signed)
Patient remains disoriented x4 this AM and is unable to follow commands. Patient continues to be very anxious and restless when awake, pulling off monitoring cables and trying to pull out IV. Pt also attempting to climb out of bed. Dr Hanley Ben notified by this RN and orders received for soft waist belt. Mitts also on patient at this time. RN will continue to attempt to reorient patient as able and ensure safety interventions are implemented appropriately.

## 2023-06-03 NOTE — Progress Notes (Addendum)
comprehensive MRSA colonization surveillance program. It is not intended to diagnose MRSA infection nor to guide or monitor treatment for MRSA infections. Test performance is not FDA approved in patients less than 84 years old. Performed at [facility], [address] [place], [place] [address]          Radiology Studies: ECHOCARDIOGRAM COMPLETE  Result Date: 06/02/2023    ECHOCARDIOGRAM REPORT   Patient Name:   Donna Green Date of Exam: 06/02/2023 Medical Rec #:  [id number]      Height:       65.0 in Accession #:    [id number]     Weight:       166.7 lb Date of Birth:  [birth year]      BSA:          1.831 m Patient Age:    81  years       BP:           158/74 mmHg Patient Gender: F              HR:           82 bpm. Exam Location:  Inpatient Procedure: 2D Echo, Cardiac Doppler, Color Doppler and Strain Analysis Indications:    Atrial Fibrillation I48.91  History:        Patient has no prior history of Echocardiogram examinations.                 Stroke; Risk Factors:Diabetes and Hypertension.  Sonographer:    Darlys Gales Referring Phys: [id number] DAVID MANUEL ORTIZ  Sonographer Comments: Global longitudinal strain was attempted. IMPRESSIONS  1. Difficult strain acquisition; consider future PYP testing or CMR testing. Left ventricular ejection fraction, by estimation, is 65 to 70%. The left ventricle has normal function. The left ventricle has no regional wall motion abnormalities. 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 Left ventricular diastolic parameters are indeterminate.  2. Right ventricular systolic function is normal. The right ventricular size is normal. Moderately increased right ventricular wall thickness. There is mildly elevated pulmonary artery systolic pressure. The estimated right ventricular systolic pressure  is 38.0 mmHg.  3. A small pericardial effusion is present. The pericardial effusion is circumferential.  4. The mitral valve is degenerative. Trivial mitral valve regurgitation. The mean mitral valve gradient is 3.7 mmHg with average heart rate of 83 bpm. Moderate mitral annular calcification.  5. Tricuspid valve regurgitation is mild to moderate.  6. The aortic valve was not well visualized. There is moderate calcification of the aortic valve. Aortic valve regurgitation is not visualized. Aortic valve sclerosis is present, with no evidence of aortic valve stenosis.  7. The inferior vena cava is normal in size with greater than 50% respiratory variability, suggesting right atrial pressure of 3 mmHg. FINDINGS  Left Ventricle: Difficult strain acquisition; consider future  PYP testing or CMR testing. Left ventricular ejection fraction, by estimation, is 65 to 70%. The left ventricle has normal function. The left ventricle has no regional wall motion abnormalities. Global longitudinal strain performed but not reported based on interpreter judgement due to suboptimal tracking. The left ventricular internal cavity size was normal in size. 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 Left ventricular diastolic parameters are indeterminate. Right Ventricle: The right ventricular size is normal. Moderately increased right ventricular wall thickness. Right ventricular systolic function is normal. There is mildly elevated pulmonary artery systolic pressure. The tricuspid regurgitant velocity is 2.96 m/s, and  PROGRESS NOTE    Donna Green  WUJ:[id number] DOB: [birth year] DOA: 06/01/2023 PCP: Renford Dills, MD   Brief Narrative:  82 y.o. 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  On presentation, her blood pressure was elevated to 209/108 and she was found to be in new onset A-fib with RVR.  Troponins were mildly elevated at 100, then 104, then 105, then 92.  Chest x-ray showed no acute cardiopulmonary process but showed a large hiatal hernia.  CT of the head with no acute intracranial abnormality.  CT cervical spine showed no acute fractures.  CT abdomen/pelvis showed mild L1 superior endplate central compression fracture with slight posterior bowing of the dorsal vertebral cortex but no significant anterior posterior height loss; large hiatal hernia with inverted intrathoracic stomach; diverticulosis without diverticulitis.  Left knee x-ray showed no fracture or effusion.  Left femur with no acute findings.  Assessment & Plan:   New onset paroxysmal A-fib with RVR -Presented with new onset A-fib with RVR.  Cardiology following.  Currently on IV metoprolol.  Follow further cardiology recommendations.  Cardiology to decide about anticoagulation, given high risk for falls.  Echo showed EF of 65 to 70%.  Elevated troponins -Troponins did not trend up significantly.  No chest pain.  Follow cardiology recommendations  Acute metabolic encephalopathy -Patient presented with fall and trauma to head but no loss of consciousness.  CT of the head was negative for acute intracranial abnormality. -Unclear if the patient has concussion or has undiagnosed dementia. -Has had intermittent confusion and restlessness as per nursing staff.  Patient takes Ativan at home by  mouth.  Switch to IV Ativan as needed. -Monitor mental status.  Fall precautions.  PT eval.  Close compression fracture of L1 vertebra -Continue thoracolumbar corset.  Fall precautions.  PT evaluation.  Pain management.  Large hiatal hernia -Continue PPI.  Outpatient follow-up with general surgery  History of stroke with residual right-sided deficit Hyperlipidemia -Supportive care.  Outpatient follow-up with neurology.  Continue dipyridamole-aspirin and statin  CKD stage IV -Baseline creatinine around 1.7-2.2.  Creatinine at baseline currently.  Monitor.  Hypertensive crisis in a patient with essential hypertension -Continue amlodipine and IV metoprolol.  Blood pressure improving.  Monitor blood pressure  Hypokalemia -Improved  Hypomagnesemia -Improved  Macrocytic anemia -Questionable cause.  Hemoglobin stable.  Check B12.  Start folic acid supplementation empirically once able to take orally.  Anxiety -Ativan plan as above  Diabetes mellitus type 2 with hyperglycemia -A1c 7.7.  Continue CBGs with SSI.  Continue long-acting insulin  Goals of care -Overall prognosis is guarded.  Currently listed as full code.  Will consult palliative care for goals of care discussion.   DVT prophylaxis: Lovenox Code Status: Full Family Communication: None at bedside Disposition Plan: Status is: Observation The patient will require care spanning > 2 midnights and should be moved to inpatient because: Of severity of illness.    Consultants: Cardiology.  Palliative care  Procedures: Echo  Antimicrobials: None   Subjective: Patient seen and examined at bedside.  Nursing staff reports increasing restlessness, intermittent agitation and confusion.  Received IV Dilaudid earlier.  Currently drowsy, wakes up slightly, hardly answers any questions.  No fever, seizures, vomiting reported.  Objective: Vitals:   06/03/23 0800 06/03/23 0816 06/03/23 0826 06/03/23 0900  BP: Marland Kitchen)  PROGRESS NOTE    Donna Green  WUJ:[id number] DOB: [birth year] DOA: 06/01/2023 PCP: Renford Dills, MD   Brief Narrative:  82 y.o. 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  On presentation, her blood pressure was elevated to 209/108 and she was found to be in new onset A-fib with RVR.  Troponins were mildly elevated at 100, then 104, then 105, then 92.  Chest x-ray showed no acute cardiopulmonary process but showed a large hiatal hernia.  CT of the head with no acute intracranial abnormality.  CT cervical spine showed no acute fractures.  CT abdomen/pelvis showed mild L1 superior endplate central compression fracture with slight posterior bowing of the dorsal vertebral cortex but no significant anterior posterior height loss; large hiatal hernia with inverted intrathoracic stomach; diverticulosis without diverticulitis.  Left knee x-ray showed no fracture or effusion.  Left femur with no acute findings.  Assessment & Plan:   New onset paroxysmal A-fib with RVR -Presented with new onset A-fib with RVR.  Cardiology following.  Currently on IV metoprolol.  Follow further cardiology recommendations.  Cardiology to decide about anticoagulation, given high risk for falls.  Echo showed EF of 65 to 70%.  Elevated troponins -Troponins did not trend up significantly.  No chest pain.  Follow cardiology recommendations  Acute metabolic encephalopathy -Patient presented with fall and trauma to head but no loss of consciousness.  CT of the head was negative for acute intracranial abnormality. -Unclear if the patient has concussion or has undiagnosed dementia. -Has had intermittent confusion and restlessness as per nursing staff.  Patient takes Ativan at home by  mouth.  Switch to IV Ativan as needed. -Monitor mental status.  Fall precautions.  PT eval.  Close compression fracture of L1 vertebra -Continue thoracolumbar corset.  Fall precautions.  PT evaluation.  Pain management.  Large hiatal hernia -Continue PPI.  Outpatient follow-up with general surgery  History of stroke with residual right-sided deficit Hyperlipidemia -Supportive care.  Outpatient follow-up with neurology.  Continue dipyridamole-aspirin and statin  CKD stage IV -Baseline creatinine around 1.7-2.2.  Creatinine at baseline currently.  Monitor.  Hypertensive crisis in a patient with essential hypertension -Continue amlodipine and IV metoprolol.  Blood pressure improving.  Monitor blood pressure  Hypokalemia -Improved  Hypomagnesemia -Improved  Macrocytic anemia -Questionable cause.  Hemoglobin stable.  Check B12.  Start folic acid supplementation empirically once able to take orally.  Anxiety -Ativan plan as above  Diabetes mellitus type 2 with hyperglycemia -A1c 7.7.  Continue CBGs with SSI.  Continue long-acting insulin  Goals of care -Overall prognosis is guarded.  Currently listed as full code.  Will consult palliative care for goals of care discussion.   DVT prophylaxis: Lovenox Code Status: Full Family Communication: None at bedside Disposition Plan: Status is: Observation The patient will require care spanning > 2 midnights and should be moved to inpatient because: Of severity of illness.    Consultants: Cardiology.  Palliative care  Procedures: Echo  Antimicrobials: None   Subjective: Patient seen and examined at bedside.  Nursing staff reports increasing restlessness, intermittent agitation and confusion.  Received IV Dilaudid earlier.  Currently drowsy, wakes up slightly, hardly answers any questions.  No fever, seizures, vomiting reported.  Objective: Vitals:   06/03/23 0800 06/03/23 0816 06/03/23 0826 06/03/23 0900  BP: Marland Kitchen)  comprehensive MRSA colonization surveillance program. It is not intended to diagnose MRSA infection nor to guide or monitor treatment for MRSA infections. Test performance is not FDA approved in patients less than 84 years old. Performed at [facility], [address] [place], [place] [address]          Radiology Studies: ECHOCARDIOGRAM COMPLETE  Result Date: 06/02/2023    ECHOCARDIOGRAM REPORT   Patient Name:   Donna Green Date of Exam: 06/02/2023 Medical Rec #:  [id number]      Height:       65.0 in Accession #:    [id number]     Weight:       166.7 lb Date of Birth:  [birth year]      BSA:          1.831 m Patient Age:    81  years       BP:           158/74 mmHg Patient Gender: F              HR:           82 bpm. Exam Location:  Inpatient Procedure: 2D Echo, Cardiac Doppler, Color Doppler and Strain Analysis Indications:    Atrial Fibrillation I48.91  History:        Patient has no prior history of Echocardiogram examinations.                 Stroke; Risk Factors:Diabetes and Hypertension.  Sonographer:    Darlys Gales Referring Phys: [id number] DAVID MANUEL ORTIZ  Sonographer Comments: Global longitudinal strain was attempted. IMPRESSIONS  1. Difficult strain acquisition; consider future PYP testing or CMR testing. Left ventricular ejection fraction, by estimation, is 65 to 70%. The left ventricle has normal function. The left ventricle has no regional wall motion abnormalities. 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 Left ventricular diastolic parameters are indeterminate.  2. Right ventricular systolic function is normal. The right ventricular size is normal. Moderately increased right ventricular wall thickness. There is mildly elevated pulmonary artery systolic pressure. The estimated right ventricular systolic pressure  is 38.0 mmHg.  3. A small pericardial effusion is present. The pericardial effusion is circumferential.  4. The mitral valve is degenerative. Trivial mitral valve regurgitation. The mean mitral valve gradient is 3.7 mmHg with average heart rate of 83 bpm. Moderate mitral annular calcification.  5. Tricuspid valve regurgitation is mild to moderate.  6. The aortic valve was not well visualized. There is moderate calcification of the aortic valve. Aortic valve regurgitation is not visualized. Aortic valve sclerosis is present, with no evidence of aortic valve stenosis.  7. The inferior vena cava is normal in size with greater than 50% respiratory variability, suggesting right atrial pressure of 3 mmHg. FINDINGS  Left Ventricle: Difficult strain acquisition; consider future  PYP testing or CMR testing. Left ventricular ejection fraction, by estimation, is 65 to 70%. The left ventricle has normal function. The left ventricle has no regional wall motion abnormalities. Global longitudinal strain performed but not reported based on interpreter judgement due to suboptimal tracking. The left ventricular internal cavity size was normal in size. 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 Left ventricular diastolic parameters are indeterminate. Right Ventricle: The right ventricular size is normal. Moderately increased right ventricular wall thickness. Right ventricular systolic function is normal. There is mildly elevated pulmonary artery systolic pressure. The tricuspid regurgitant velocity is 2.96 m/s, and  comprehensive MRSA colonization surveillance program. It is not intended to diagnose MRSA infection nor to guide or monitor treatment for MRSA infections. Test performance is not FDA approved in patients less than 84 years old. Performed at [facility], [address] [place], [place] [address]          Radiology Studies: ECHOCARDIOGRAM COMPLETE  Result Date: 06/02/2023    ECHOCARDIOGRAM REPORT   Patient Name:   Donna Green Date of Exam: 06/02/2023 Medical Rec #:  [id number]      Height:       65.0 in Accession #:    [id number]     Weight:       166.7 lb Date of Birth:  [birth year]      BSA:          1.831 m Patient Age:    81  years       BP:           158/74 mmHg Patient Gender: F              HR:           82 bpm. Exam Location:  Inpatient Procedure: 2D Echo, Cardiac Doppler, Color Doppler and Strain Analysis Indications:    Atrial Fibrillation I48.91  History:        Patient has no prior history of Echocardiogram examinations.                 Stroke; Risk Factors:Diabetes and Hypertension.  Sonographer:    Darlys Gales Referring Phys: [id number] DAVID MANUEL ORTIZ  Sonographer Comments: Global longitudinal strain was attempted. IMPRESSIONS  1. Difficult strain acquisition; consider future PYP testing or CMR testing. Left ventricular ejection fraction, by estimation, is 65 to 70%. The left ventricle has normal function. The left ventricle has no regional wall motion abnormalities. 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 Left ventricular diastolic parameters are indeterminate.  2. Right ventricular systolic function is normal. The right ventricular size is normal. Moderately increased right ventricular wall thickness. There is mildly elevated pulmonary artery systolic pressure. The estimated right ventricular systolic pressure  is 38.0 mmHg.  3. A small pericardial effusion is present. The pericardial effusion is circumferential.  4. The mitral valve is degenerative. Trivial mitral valve regurgitation. The mean mitral valve gradient is 3.7 mmHg with average heart rate of 83 bpm. Moderate mitral annular calcification.  5. Tricuspid valve regurgitation is mild to moderate.  6. The aortic valve was not well visualized. There is moderate calcification of the aortic valve. Aortic valve regurgitation is not visualized. Aortic valve sclerosis is present, with no evidence of aortic valve stenosis.  7. The inferior vena cava is normal in size with greater than 50% respiratory variability, suggesting right atrial pressure of 3 mmHg. FINDINGS  Left Ventricle: Difficult strain acquisition; consider future  PYP testing or CMR testing. Left ventricular ejection fraction, by estimation, is 65 to 70%. The left ventricle has normal function. The left ventricle has no regional wall motion abnormalities. Global longitudinal strain performed but not reported based on interpreter judgement due to suboptimal tracking. The left ventricular internal cavity size was normal in size. 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 Left ventricular diastolic parameters are indeterminate. Right Ventricle: The right ventricular size is normal. Moderately increased right ventricular wall thickness. Right ventricular systolic function is normal. There is mildly elevated pulmonary artery systolic pressure. The tricuspid regurgitant velocity is 2.96 m/s, and  comprehensive MRSA colonization surveillance program. It is not intended to diagnose MRSA infection nor to guide or monitor treatment for MRSA infections. Test performance is not FDA approved in patients less than 84 years old. Performed at [facility], [address] [place], [place] [address]          Radiology Studies: ECHOCARDIOGRAM COMPLETE  Result Date: 06/02/2023    ECHOCARDIOGRAM REPORT   Patient Name:   Donna Green Date of Exam: 06/02/2023 Medical Rec #:  [id number]      Height:       65.0 in Accession #:    [id number]     Weight:       166.7 lb Date of Birth:  [birth year]      BSA:          1.831 m Patient Age:    81  years       BP:           158/74 mmHg Patient Gender: F              HR:           82 bpm. Exam Location:  Inpatient Procedure: 2D Echo, Cardiac Doppler, Color Doppler and Strain Analysis Indications:    Atrial Fibrillation I48.91  History:        Patient has no prior history of Echocardiogram examinations.                 Stroke; Risk Factors:Diabetes and Hypertension.  Sonographer:    Darlys Gales Referring Phys: [id number] DAVID MANUEL ORTIZ  Sonographer Comments: Global longitudinal strain was attempted. IMPRESSIONS  1. Difficult strain acquisition; consider future PYP testing or CMR testing. Left ventricular ejection fraction, by estimation, is 65 to 70%. The left ventricle has normal function. The left ventricle has no regional wall motion abnormalities. 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 Left ventricular diastolic parameters are indeterminate.  2. Right ventricular systolic function is normal. The right ventricular size is normal. Moderately increased right ventricular wall thickness. There is mildly elevated pulmonary artery systolic pressure. The estimated right ventricular systolic pressure  is 38.0 mmHg.  3. A small pericardial effusion is present. The pericardial effusion is circumferential.  4. The mitral valve is degenerative. Trivial mitral valve regurgitation. The mean mitral valve gradient is 3.7 mmHg with average heart rate of 83 bpm. Moderate mitral annular calcification.  5. Tricuspid valve regurgitation is mild to moderate.  6. The aortic valve was not well visualized. There is moderate calcification of the aortic valve. Aortic valve regurgitation is not visualized. Aortic valve sclerosis is present, with no evidence of aortic valve stenosis.  7. The inferior vena cava is normal in size with greater than 50% respiratory variability, suggesting right atrial pressure of 3 mmHg. FINDINGS  Left Ventricle: Difficult strain acquisition; consider future  PYP testing or CMR testing. Left ventricular ejection fraction, by estimation, is 65 to 70%. The left ventricle has normal function. The left ventricle has no regional wall motion abnormalities. Global longitudinal strain performed but not reported based on interpreter judgement due to suboptimal tracking. The left ventricular internal cavity size was normal in size. 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 Left ventricular diastolic parameters are indeterminate. Right Ventricle: The right ventricular size is normal. Moderately increased right ventricular wall thickness. Right ventricular systolic function is normal. There is mildly elevated pulmonary artery systolic pressure. The tricuspid regurgitant velocity is 2.96 m/s, and  comprehensive MRSA colonization surveillance program. It is not intended to diagnose MRSA infection nor to guide or monitor treatment for MRSA infections. Test performance is not FDA approved in patients less than 84 years old. Performed at [facility], [address] [place], [place] [address]          Radiology Studies: ECHOCARDIOGRAM COMPLETE  Result Date: 06/02/2023    ECHOCARDIOGRAM REPORT   Patient Name:   Donna Green Date of Exam: 06/02/2023 Medical Rec #:  [id number]      Height:       65.0 in Accession #:    [id number]     Weight:       166.7 lb Date of Birth:  [birth year]      BSA:          1.831 m Patient Age:    81  years       BP:           158/74 mmHg Patient Gender: F              HR:           82 bpm. Exam Location:  Inpatient Procedure: 2D Echo, Cardiac Doppler, Color Doppler and Strain Analysis Indications:    Atrial Fibrillation I48.91  History:        Patient has no prior history of Echocardiogram examinations.                 Stroke; Risk Factors:Diabetes and Hypertension.  Sonographer:    Darlys Gales Referring Phys: [id number] DAVID MANUEL ORTIZ  Sonographer Comments: Global longitudinal strain was attempted. IMPRESSIONS  1. Difficult strain acquisition; consider future PYP testing or CMR testing. Left ventricular ejection fraction, by estimation, is 65 to 70%. The left ventricle has normal function. The left ventricle has no regional wall motion abnormalities. 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 Left ventricular diastolic parameters are indeterminate.  2. Right ventricular systolic function is normal. The right ventricular size is normal. Moderately increased right ventricular wall thickness. There is mildly elevated pulmonary artery systolic pressure. The estimated right ventricular systolic pressure  is 38.0 mmHg.  3. A small pericardial effusion is present. The pericardial effusion is circumferential.  4. The mitral valve is degenerative. Trivial mitral valve regurgitation. The mean mitral valve gradient is 3.7 mmHg with average heart rate of 83 bpm. Moderate mitral annular calcification.  5. Tricuspid valve regurgitation is mild to moderate.  6. The aortic valve was not well visualized. There is moderate calcification of the aortic valve. Aortic valve regurgitation is not visualized. Aortic valve sclerosis is present, with no evidence of aortic valve stenosis.  7. The inferior vena cava is normal in size with greater than 50% respiratory variability, suggesting right atrial pressure of 3 mmHg. FINDINGS  Left Ventricle: Difficult strain acquisition; consider future  PYP testing or CMR testing. Left ventricular ejection fraction, by estimation, is 65 to 70%. The left ventricle has normal function. The left ventricle has no regional wall motion abnormalities. Global longitudinal strain performed but not reported based on interpreter judgement due to suboptimal tracking. The left ventricular internal cavity size was normal in size. 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 Left ventricular diastolic parameters are indeterminate. Right Ventricle: The right ventricular size is normal. Moderately increased right ventricular wall thickness. Right ventricular systolic function is normal. There is mildly elevated pulmonary artery systolic pressure. The tricuspid regurgitant velocity is 2.96 m/s, and  comprehensive MRSA colonization surveillance program. It is not intended to diagnose MRSA infection nor to guide or monitor treatment for MRSA infections. Test performance is not FDA approved in patients less than 84 years old. Performed at [facility], [address] [place], [place] [address]          Radiology Studies: ECHOCARDIOGRAM COMPLETE  Result Date: 06/02/2023    ECHOCARDIOGRAM REPORT   Patient Name:   Donna Green Date of Exam: 06/02/2023 Medical Rec #:  [id number]      Height:       65.0 in Accession #:    [id number]     Weight:       166.7 lb Date of Birth:  [birth year]      BSA:          1.831 m Patient Age:    81  years       BP:           158/74 mmHg Patient Gender: F              HR:           82 bpm. Exam Location:  Inpatient Procedure: 2D Echo, Cardiac Doppler, Color Doppler and Strain Analysis Indications:    Atrial Fibrillation I48.91  History:        Patient has no prior history of Echocardiogram examinations.                 Stroke; Risk Factors:Diabetes and Hypertension.  Sonographer:    Darlys Gales Referring Phys: [id number] DAVID MANUEL ORTIZ  Sonographer Comments: Global longitudinal strain was attempted. IMPRESSIONS  1. Difficult strain acquisition; consider future PYP testing or CMR testing. Left ventricular ejection fraction, by estimation, is 65 to 70%. The left ventricle has normal function. The left ventricle has no regional wall motion abnormalities. 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 Left ventricular diastolic parameters are indeterminate.  2. Right ventricular systolic function is normal. The right ventricular size is normal. Moderately increased right ventricular wall thickness. There is mildly elevated pulmonary artery systolic pressure. The estimated right ventricular systolic pressure  is 38.0 mmHg.  3. A small pericardial effusion is present. The pericardial effusion is circumferential.  4. The mitral valve is degenerative. Trivial mitral valve regurgitation. The mean mitral valve gradient is 3.7 mmHg with average heart rate of 83 bpm. Moderate mitral annular calcification.  5. Tricuspid valve regurgitation is mild to moderate.  6. The aortic valve was not well visualized. There is moderate calcification of the aortic valve. Aortic valve regurgitation is not visualized. Aortic valve sclerosis is present, with no evidence of aortic valve stenosis.  7. The inferior vena cava is normal in size with greater than 50% respiratory variability, suggesting right atrial pressure of 3 mmHg. FINDINGS  Left Ventricle: Difficult strain acquisition; consider future  PYP testing or CMR testing. Left ventricular ejection fraction, by estimation, is 65 to 70%. The left ventricle has normal function. The left ventricle has no regional wall motion abnormalities. Global longitudinal strain performed but not reported based on interpreter judgement due to suboptimal tracking. The left ventricular internal cavity size was normal in size. 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 Left ventricular diastolic parameters are indeterminate. Right Ventricle: The right ventricular size is normal. Moderately increased right ventricular wall thickness. Right ventricular systolic function is normal. There is mildly elevated pulmonary artery systolic pressure. The tricuspid regurgitant velocity is 2.96 m/s, and

## 2023-06-03 NOTE — Plan of Care (Signed)
  Problem: Clinical Measurements: Goal: Respiratory complications will improve Outcome: Progressing   Problem: Activity: Goal: Risk for activity intolerance will decrease Outcome: Progressing   Problem: Pain Managment: Goal: General experience of comfort will improve Outcome: Progressing   Problem: Safety: Goal: Ability to remain free from injury will improve Outcome: Progressing   Problem: Coping: Goal: Level of anxiety will decrease Outcome: Not Progressing

## 2023-06-03 NOTE — Progress Notes (Signed)
PT Cancellation Note  Patient Details Name: Donna Green MRN: [id number] DOB: September 10, 1940   Cancelled Treatment:    Reason Eval/Treat Not Completed: Patient's level of consciousness;Other (comment) (Pt in posy belt and remains significantly confused. RN requesting to hold on mobility at this time. Will follow up at later date/time as pt able and schedule allows.)    Renaldo Fiddler PT, DPT Acute Rehabilitation Services Office [phone]  06/03/23 9:10 AM

## 2023-06-03 NOTE — Progress Notes (Signed)
RN spoke with patient's daughter, Stacie Acres, at bedside about patient's sudden onset confusion and anxiety that started overnight. Per Daphane, patient has not tolerated narcotics well in the past, as patient becomes agitated, confused, and hallucinates after receiving medication. Patient's daughter requested that Morphine and Dilaudid be listed as allergies in patient's chart, and mentioned that in the past it takes about 12 hours for confusion to subside post administration. Dr Hanley Ben notified about findings, and allergies were added as requested.

## 2023-06-04 ENCOUNTER — Inpatient Hospital Stay (HOSPITAL_COMMUNITY): Payer: Medicare PPO

## 2023-06-04 DIAGNOSIS — Z7189 Other specified counseling: Secondary | ICD-10-CM | POA: Diagnosis not present

## 2023-06-04 DIAGNOSIS — Z8673 Personal history of transient ischemic attack (TIA), and cerebral infarction without residual deficits: Secondary | ICD-10-CM

## 2023-06-04 DIAGNOSIS — W19XXXA Unspecified fall, initial encounter: Secondary | ICD-10-CM | POA: Diagnosis not present

## 2023-06-04 DIAGNOSIS — I4891 Unspecified atrial fibrillation: Secondary | ICD-10-CM | POA: Diagnosis not present

## 2023-06-04 DIAGNOSIS — Z515 Encounter for palliative care: Secondary | ICD-10-CM | POA: Diagnosis not present

## 2023-06-04 DIAGNOSIS — I169 Hypertensive crisis, unspecified: Secondary | ICD-10-CM | POA: Diagnosis not present

## 2023-06-04 LAB — URINALYSIS, ROUTINE W REFLEX MICROSCOPIC
Bilirubin Urine: NEGATIVE
Glucose, UA: 50 mg/dL — AB
Ketones, ur: NEGATIVE mg/dL
Nitrite: NEGATIVE
Protein, ur: >=300 mg/dL — AB
Specific Gravity, Urine: 1.019 (ref 1.005–1.030)
pH: 5 (ref 5.0–8.0)

## 2023-06-04 LAB — CBC WITH DIFFERENTIAL/PLATELET
Abs Immature Granulocytes: 0.02 10*3/uL (ref 0.00–0.07)
Basophils Absolute: 0 10*3/uL (ref 0.0–0.1)
Basophils Relative: 0 %
Eosinophils Absolute: 0.2 10*3/uL (ref 0.0–0.5)
Eosinophils Relative: 2 %
HCT: 33.7 % — ABNORMAL LOW (ref 36.0–46.0)
Hemoglobin: 10.5 g/dL — ABNORMAL LOW (ref 12.0–15.0)
Immature Granulocytes: 0 %
Lymphocytes Relative: 26 %
Lymphs Abs: 1.7 10*3/uL (ref 0.7–4.0)
MCH: 33.1 pg (ref 26.0–34.0)
MCHC: 31.2 g/dL (ref 30.0–36.0)
MCV: 106.3 fL — ABNORMAL HIGH (ref 80.0–100.0)
Monocytes Absolute: 0.6 10*3/uL (ref 0.1–1.0)
Monocytes Relative: 9 %
Neutro Abs: 4.1 10*3/uL (ref 1.7–7.7)
Neutrophils Relative %: 63 %
Platelets: 177 10*3/uL (ref 150–400)
RBC: 3.17 MIL/uL — ABNORMAL LOW (ref 3.87–5.11)
RDW: 14.4 % (ref 11.5–15.5)
WBC: 6.6 10*3/uL (ref 4.0–10.5)
nRBC: 0 % (ref 0.0–0.2)

## 2023-06-04 LAB — COMPREHENSIVE METABOLIC PANEL
ALT: 15 U/L (ref 0–44)
AST: 29 U/L (ref 15–41)
Albumin: 2.9 g/dL — ABNORMAL LOW (ref 3.5–5.0)
Alkaline Phosphatase: 40 U/L (ref 38–126)
Anion gap: 8 (ref 5–15)
BUN: 35 mg/dL — ABNORMAL HIGH (ref 8–23)
CO2: 20 mmol/L — ABNORMAL LOW (ref 22–32)
Calcium: 7.8 mg/dL — ABNORMAL LOW (ref 8.9–10.3)
Chloride: 110 mmol/L (ref 98–111)
Creatinine, Ser: 2.16 mg/dL — ABNORMAL HIGH (ref 0.44–1.00)
GFR, Estimated: 22 mL/min — ABNORMAL LOW (ref >60)
Glucose, Bld: 201 mg/dL — ABNORMAL HIGH (ref 70–99)
Potassium: 3.5 mmol/L (ref 3.5–5.1)
Sodium: 138 mmol/L (ref 135–145)
Total Bilirubin: 0.7 mg/dL (ref 0.3–1.2)
Total Protein: 6.3 g/dL — ABNORMAL LOW (ref 6.5–8.1)

## 2023-06-04 LAB — GLUCOSE, CAPILLARY
Glucose-Capillary: 157 mg/dL — ABNORMAL HIGH (ref 70–99)
Glucose-Capillary: 173 mg/dL — ABNORMAL HIGH (ref 70–99)
Glucose-Capillary: 178 mg/dL — ABNORMAL HIGH (ref 70–99)
Glucose-Capillary: 132 mg/dL — ABNORMAL HIGH (ref 70–99)

## 2023-06-04 LAB — AMMONIA: Ammonia: 28 umol/L (ref 9–35)

## 2023-06-04 LAB — VITAMIN B12: Vitamin B-12: 196 pg/mL (ref 180–914)

## 2023-06-04 LAB — MAGNESIUM: Magnesium: 1.6 mg/dL — ABNORMAL LOW (ref 1.7–2.4)

## 2023-06-04 LAB — TSH: TSH: 0.791 u[IU]/mL (ref 0.350–4.500)

## 2023-06-04 MED ORDER — CHLORHEXIDINE GLUCONATE CLOTH 2 % EX PADS
6.0000 | MEDICATED_PAD | Freq: Every day | CUTANEOUS | Status: DC
  Administered 2023-06-05: 6 via TOPICAL

## 2023-06-04 MED ORDER — FOLIC ACID 1 MG PO TABS
1.0000 mg | ORAL_TABLET | Freq: Every day | ORAL | Status: DC
  Administered 2023-06-04 – 2023-06-10 (×7): 1 mg via ORAL
  Filled 2023-06-04 (×7): qty 1

## 2023-06-04 MED ORDER — LORAZEPAM 0.5 MG PO TABS
0.5000 mg | ORAL_TABLET | Freq: Three times a day (TID) | ORAL | Status: DC | PRN
  Administered 2023-06-04 – 2023-06-07 (×3): 0.5 mg via ORAL
  Filled 2023-06-04 (×3): qty 1

## 2023-06-04 MED ORDER — APIXABAN 2.5 MG PO TABS
2.5000 mg | ORAL_TABLET | Freq: Two times a day (BID) | ORAL | Status: DC
  Administered 2023-06-04 – 2023-06-10 (×13): 2.5 mg via ORAL
  Filled 2023-06-04 (×12): qty 1

## 2023-06-04 MED ORDER — CYANOCOBALAMIN 1000 MCG/ML IJ SOLN
1000.0000 ug | Freq: Every day | INTRAMUSCULAR | Status: AC
  Administered 2023-06-04 – 2023-06-08 (×5): 1000 ug via INTRAMUSCULAR
  Filled 2023-06-04 (×5): qty 1

## 2023-06-04 MED ORDER — POTASSIUM CHLORIDE CRYS ER 20 MEQ PO TBCR
40.0000 meq | EXTENDED_RELEASE_TABLET | Freq: Once | ORAL | Status: AC
  Administered 2023-06-04: 40 meq via ORAL
  Filled 2023-06-04: qty 2

## 2023-06-04 MED ORDER — CARVEDILOL 12.5 MG PO TABS
12.5000 mg | ORAL_TABLET | Freq: Two times a day (BID) | ORAL | Status: DC
  Administered 2023-06-04 – 2023-06-08 (×8): 12.5 mg via ORAL
  Filled 2023-06-04 (×8): qty 1

## 2023-06-04 MED ORDER — MAGNESIUM SULFATE 2 GM/50ML IV SOLN
2.0000 g | Freq: Once | INTRAVENOUS | Status: AC
  Administered 2023-06-04: 2 g via INTRAVENOUS
  Filled 2023-06-04: qty 50

## 2023-06-04 MED ORDER — ROSUVASTATIN CALCIUM 10 MG PO TABS
10.0000 mg | ORAL_TABLET | Freq: Every day | ORAL | Status: DC
  Administered 2023-06-04 – 2023-06-10 (×7): 10 mg via ORAL
  Filled 2023-06-04 (×7): qty 1

## 2023-06-04 MED ORDER — PANTOPRAZOLE SODIUM 40 MG PO TBEC
40.0000 mg | DELAYED_RELEASE_TABLET | Freq: Every day | ORAL | Status: DC
  Administered 2023-06-04 – 2023-06-10 (×7): 40 mg via ORAL
  Filled 2023-06-04 (×7): qty 1

## 2023-06-04 NOTE — Progress Notes (Addendum)
Patient Name: Donna Green Date of Encounter: 06/04/2023 [facility] Cardiologist: Christell Constant, MD   Interval Summary  .    She seems a lot more alert and oriented however still obviously confused today.  Does not have any complaints though.  Attempted to call daughter with updates however no answer.  Vital Signs .    Vitals:   06/04/23 0400 06/04/23 0500 06/04/23 0600 06/04/23 0620  BP: (!) 157/61  (!) 155/60   Pulse: 87  80   Resp: 11  13   Temp:    (!) 100.7 F (38.2 C)  TempSrc:    Axillary  SpO2: 92%  95%   Weight:  76.8 kg    Height:        Intake/Output Summary (Last 24 hours) at 06/04/2023 0742 Last data filed at 06/04/2023 1610 Gross per 24 hour  Intake 200 ml  Output 550 ml  Net -350 ml      06/04/2023    5:00 AM 06/02/2023   11:00 AM 03/22/2022    7:32 PM  Last 3 Weights  Weight (lbs) 169 lb 5 oz 166 lb 10.7 oz 182 lb 5.1 oz  Weight (kg) 76.8 kg 75.6 kg 82.7 kg      Telemetry/ECG    Normal sinus rhythm heart rates in the 80s.  PVCs at times and couplets.- Personally Reviewed  CV Studies    Echocardiogram 06/02/2023 1. Difficult strain acquisition; consider future PYP testing or CMR  testing. Left ventricular ejection fraction, by estimation, is 65 to 70%.  The left ventricle has normal function. The left ventricle has no regional  wall motion abnormalities. There is  severe asymmetric left ventricular hypertrophy of the basal-septal  segment. Left ventricular diastolic parameters are indeterminate.   2. Right ventricular systolic function is normal. The right ventricular  size is normal. Moderately increased right ventricular wall thickness.  There is mildly elevated pulmonary artery systolic pressure. The estimated  right ventricular systolic pressure   is 38.0 mmHg.   3. A small pericardial effusion is present. The pericardial effusion is  circumferential.   4. The mitral valve is degenerative. Trivial mitral valve  regurgitation.  The mean mitral valve gradient is 3.7 mmHg with average heart rate of 83  bpm. Moderate mitral annular calcification.   5. Tricuspid valve regurgitation is mild to moderate.   6. The aortic valve was not well visualized. There is moderate  calcification of the aortic valve. Aortic valve regurgitation is not  visualized. Aortic valve sclerosis is present, with no evidence of aortic  valve stenosis.   7. The inferior vena cava is normal in size with greater than 50%  respiratory variability, suggesting right atrial pressure of 3 mmHg.   Physical Exam .   GEN: No acute distress.   Neck: No JVD Cardiac: RRR, no murmurs, rubs, or gallops.  Respiratory: Clear to auscultation bilaterally. GI: Soft, nontender, non-distended  MS: No edema  Patient Profile    Donna Green is a 82 y.o. female has hx of   DM, HTN, HLD, CVA x 2 (2007, 2011), CKD IV,  and admitted on 06/02/2023 for the evaluation of afib after sustaining a fall from home.  Assessment & Plan .     New onset atrial fibrillation Appears to have had a brief episode in the setting of a fall converted to normal sinus rhythm spontaneously yesterday.  She has been maintaining normal sinus rhythm.  Patient's mentation seems to be improved  however likely has some baseline dementia.  Will transition her back to her p.o. medications. Starting Eliquis 2.5 mg twice daily (renal function/age) and carvedilol 12.5 mg twice daily.   Echocardiogram showed normal EF 65 to 70%.  She has normal TSH.  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  Recommendations at that point was a yearly electrophoresis.  Given AMS do not have any further details of this.  Echocardiogram did show normal EF 65 to 70% but had severe asymmetric LVH of the basal septal segment.  Normal RV function and mildly elevated RVSP 38.  Small circumferential pericardial  effusion.  Discussed with MD options of cardiac MRI versus PYP scan outpatient.  Will pursue outpatient PYP scan given renal dysfunction and lack of inability to tolerate procedure. Should probably also get reestablished with oncology too.   Elevated trops Trops flat in the low 100s. Likely demand from fall and afib. Not in the pattern of ACS.   Hypertensive emergency She has had much improved blood pressures now but were initially in the 240s.  Currently she is maintaining 140s systolic. Continue carvedilol 12.5 mg twice daily. Given CKD stage IV with a GFR of 24.  Discontinuing her losartan.  I have increased her amlodipine from 2.5 mg to 5 mg daily  CKD stage 4 Cr 2.16 stable this admission  HLD Continue rosuvastatin 20mg . 03/2022 LDL 3.    Mild to moderate TR Moderate aortic valve calcifications Moderate MAC Continue to monitor outpatient.  For questions or updates, please contact [facility] Please consult [email or web address] for contact info under        Signed, Abagail Kitchens, PA-C    I have seen and examined the patient along with Abagail Kitchens, PA-C  NP.  I have reviewed the chart, notes and new data.  I agree with PA/NP's note.  Key new complaints: comfortable lying fully supine Key examination changes: no murmurs. Most recent BPs 110/60 range Key new findings / data: occ PVCs, no AFib. BUN and creat up a little bit  PLAN: Continue current meds. No plans for additional inpatient workup. Will get a PYP scan as outpatient. Will monitor BP over next 24 hours but it is unlikely we will recommend any other changes.  Thurmon Fair, MD, [facility] [phone] 06/04/2023, 11:26 AM

## 2023-06-04 NOTE — Evaluation (Signed)
Physical Therapy Evaluation Patient Details Name: Donna Green MRN: [id number] DOB: 01/14/1941 Today's Date: 06/04/2023  History of Present Illness  82 y.o. 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 Blood pressure was elevated was found to be in new onset A-fib with RVR.  Troponins were mildly elevated  Chest x-ray showed no acute cardiopulmonary process but showed a large hiatal hernia.  CT of the head and  neck  and left femur and knee with no acute abnormality.  .  CT abdomen/pelvis showed mild L1 superior endplate central compression fracture  Clinical Impression  Pt admitted with above diagnosis.  Pt currently with functional limitations due to the deficits listed below (see PT Problem List). Pt will benefit from acute skilled PT to increase their independence and safety with mobility to allow discharge.     The patient  reports feeling badly, noted need for linens to  be changed.  Assisted patient with rolling and required max to total assist, patient indicating pain in the left knee and in the back with mobility.  Total Assistance of 2 to move  patient to sitting  on bed edge ,  poor balance, Unable to don the brace in sitting at this time, patient's trunk flexed and tilting. Patient sat  ~ 2 minutes before return to supine.  No family present, patient indicates that she was independent PTA.  Patient will benefit from continued inpatient follow up therapy, <3 hours/day       If plan is discharge home, recommend the following: Two people to help with walking and/or transfers;Assistance with cooking/housework;Assist for transportation;Help with stairs or ramp for entrance;Two people to help with bathing/dressing/bathroom   Can travel by  private vehicle   No    Equipment Recommendations None recommended by PT  Recommendations for Other Services    OT   Functional Status Assessment Patient has had a recent decline in their functional status and demonstrates the ability to make significant improvements in function in a reasonable and predictable amount of time.     Precautions / Restrictions Precautions Precautions: Fall Required Braces or Orthoses: Spinal Brace Spinal Brace: Thoracolumbosacral orthotic;Applied in sitting position Restrictions Weight Bearing Restrictions: No      Mobility  Bed Mobility Overal bed mobility: Needs Assistance Bed Mobility: Supine to Sit, Sit to Supine, Rolling Rolling: +2 for safety/equipment, +2 for physical assistance, Max assist   Supine to sit: Mod assist, Max assist, +2 for physical assistance, +2 for safety/equipment Sit to supine: Total assist, +2 for physical assistance, +2 for safety/equipment   General bed mobility comments: multimodal  cues, assist with LLE to flex to roll , assist  with legs and trunk    Transfers                   General transfer comment: unable    Ambulation/Gait                  Stairs            Wheelchair Mobility     Tilt Bed    Modified Rankin (Stroke Patients Only)       Balance Overall balance assessment: Needs assistance, History of Falls Sitting-balance support: Bilateral upper extremity supported, Feet unsupported Sitting balance-Leahy Scale: Poor   Postural control: Left  lateral lean                                   Pertinent Vitals/Pain Pain Assessment Pain Assessment: Faces Pain Location: left knee and back Pain Descriptors / Indicators: Discomfort, Grimacing, Guarding, Moaning Pain Intervention(s): Monitored during session, Repositioned, Limited activity within patient's tolerance    Home Living Family/patient expects to be discharged to:: Private residence Living  Arrangements: Children Available Help at Discharge: Available 24 hours/day Type of Home: House Home Access: Stairs to enter Entrance Stairs-Rails: None Entrance Stairs-Number of Steps: 2 Alternate Level Stairs-Number of Steps: ? 2 Home Layout: Multi-level Home Equipment: Agricultural consultant (2 wheels) Additional Comments: patient not cllear on info, no family present to clarify    Prior Function Prior Level of Function : Independent/Modified Independent             Mobility Comments: pt reports she is independnet ADLs Comments: reports independnet with ADL's     Extremity/Trunk Assessment   Upper Extremity Assessment Upper Extremity Assessment: Defer to OT evaluation (weak effort to lift arms)    Lower Extremity Assessment Lower Extremity Assessment: LLE deficits/detail;RLE deficits/detail RLE Deficits / Details: able to lift from bed and flex knee LLE Deficits / Details: required support to lift the leg and flex at hip and knee LLE: Unable to fully assess due to pain    Cervical / Trunk Assessment Cervical / Trunk Assessment: Other exceptions Cervical / Trunk Exceptions: listing to the left  Communication   Communication Communication: Hearing impairment Cueing Techniques: Verbal cues  Cognition Arousal: Lethargic Behavior During Therapy: Flat affect Overall Cognitive Status: No family/caregiver present to determine baseline cognitive functioning Area of Impairment: Orientation                 Orientation Level: Place, Time, Situation             General Comments: states at [facility], oriented to month nnot day        General Comments      Exercises     Assessment/Plan    PT Assessment Patient needs continued PT services  PT Problem List Decreased strength;Decreased cognition;Decreased knowledge of precautions;Decreased range of motion;Decreased mobility;Decreased activity tolerance;Decreased safety awareness;Pain       PT Treatment Interventions  DME instruction;Therapeutic activities;Gait training;Therapeutic exercise;Patient/family education;Functional mobility training;Balance training    PT Goals (Current goals can be found in the Care Plan section)  Acute Rehab PT Goals PT Goal Formulation: Patient unable to participate in goal setting Time For Goal Achievement: 06/18/23 Potential to Achieve Goals: Fair    Frequency Min 1X/week     Co-evaluation               AM-PAC PT "6 Clicks" Mobility  Outcome Measure Help needed turning from your back to your side while in a flat bed without using bedrails?: Total Help needed moving from lying on your back to sitting on the side of a flat bed without using bedrails?: Total Help needed moving to and from a bed to a chair (including a wheelchair)?: Total Help needed standing up from a chair using your arms (e.g., wheelchair or bedside chair)?: Total Help needed to walk in hospital room?: Total Help needed climbing 3-5 steps with a railing? : Total 6 Click Score: 6    End of Session Equipment Utilized During Treatment: Oxygen Activity Tolerance: Patient limited by fatigue;Patient limited by pain Patient left:  in bed;with call bell/phone within reach;with nursing/sitter in room Nurse Communication: Mobility status;Need for lift equipment PT Visit Diagnosis: Muscle weakness (generalized) (M62.81);History of falling (Z91.81);Difficulty in walking, not elsewhere classified (R26.2)    Time: 6948-5462 PT Time Calculation (min) (ACUTE ONLY): 23 min   Charges:   PT Evaluation $PT Eval Low Complexity: 1 Low PT Treatments $Therapeutic Activity: 8-22 mins PT General Charges $$ ACUTE PT VISIT: 1 Visit         Blanchard Kelch PT Acute Rehabilitation Services Office [phone] Weekend [phone]   Rada Hay 06/04/2023, 12:32 PM

## 2023-06-04 NOTE — [place] FL2 (Signed)
[place] MEDICAID FL2 LEVEL OF CARE FORM     IDENTIFICATION  Patient Name: Donna Green Birthdate: [birth year] Sex: female Admission Date (Current Location): 06/01/2023  [facility] and [place] Number:  Producer, television/film/video and Address:  [facility],  [address] [place], [place] [address]      Provider Number: [id number]  Attending Physician Name and Address:  Glade Lloyd, MD  Relative Name and Phone Number:  Shakina, [place] Daughter [phone]  [phone]  Shannon,[place] Granddaughter   [phone]    Current Level of Care: Hospital Recommended Level of Care: Skilled Nursing Facility Prior Approval Number:    Date Approved/Denied:   PASRR Number: [id number]  Discharge Plan: SNF    Current Diagnoses: Patient Active Problem List   Diagnosis Date Noted   A-fib [facility]) 06/03/2023   Hypertensive crisis 06/02/2023   Hypokalemia 06/02/2023   Hypomagnesemia 06/02/2023   Large hiatal hernia 06/02/2023   Atrial fibrillation with RVR (HCC) 06/02/2023   Closed compression fracture of L1 vertebra (HCC) 06/02/2023   COVID-19 virus infection 03/23/2022   Anxiety 03/23/2022   Acute kidney injury superimposed on chronic kidney disease (HCC) 03/22/2022   Essential hypertension 03/09/2015   HLD (hyperlipidemia) 03/09/2015   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   History of stroke 03/07/2015    Orientation RESPIRATION BLADDER Height & Weight     Self  O2 (2L Nasal Cannula) Continent Weight: 169 lb 5 oz (76.8 kg) Height:  5\' 5"  (165.1 cm)  BEHAVIORAL SYMPTOMS/MOOD NEUROLOGICAL BOWEL NUTRITION STATUS    Convulsions/Seizures Continent Diet  AMBULATORY STATUS COMMUNICATION OF NEEDS Skin   Limited Assist Verbally Normal                       Personal Care Assistance Level of Assistance  Bathing, Feeding, Dressing Bathing Assistance: Limited assistance Feeding assistance: Limited assistance Dressing Assistance:  Limited assistance Total Care Assistance: Limited assistance   Functional Limitations Info  Sight, Hearing, Speech Sight Info: Adequate Hearing Info: Adequate Speech Info: Adequate    SPECIAL CARE FACTORS FREQUENCY  PT (By licensed PT), OT (By licensed OT)     PT Frequency: Minimum 5x a week OT Frequency: Minimum 5x a week            Contractures Contractures Info: Not present    Additional Factors Info  Code Status, Allergies, Insulin Sliding Scale Code Status Info: Full Code Allergies Info: Codeine  Dilaudid (Hydromorphone)  Morphine  Colcrys (Colchicine)  Zyloprim (Allopurinol)  Nsaids   Insulin Sliding Scale Info: 3x a day with meals       Current Medications (06/04/2023):  This is the current hospital active medication list Current Facility-Administered Medications  Medication Dose Route Frequency Provider Last Rate Last Admin   acetaminophen (TYLENOL) tablet 650 mg  650 mg Oral Q6H PRN Bobette Mo, MD   650 mg at 06/04/23 1555   Or   acetaminophen (TYLENOL) suppository 650 mg  650 mg Rectal Q6H PRN Bobette Mo, MD       amLODipine [facility]) tablet 5 mg  5 mg Oral Daily Yvonna Alanis L, PA-C   5 mg at 06/04/23 7253   apixaban (ELIQUIS) tablet 2.5 mg  2.5 mg Oral BID Yvonna Alanis L, PA-C   2.5 mg at 06/04/23 0924   carvedilol (COREG) tablet 12.5 mg  12.5 mg Oral BID WC Yvonna Alanis L, PA-C   12.5 mg at 06/04/23 1645   Chlorhexidine Gluconate Cloth 2 %  PADS 6 each  6 each Topical Daily Opyd, Lavone Neri, MD   6 each at 06/03/23 2200   cyanocobalamin (VITAMIN B12) injection 1,000 mcg  1,000 mcg Intramuscular Q0600 Glade Lloyd, MD   1,000 mcg at 06/04/23 0924   diltiazem (CARDIZEM) injection 5 mg  5 mg Intravenous Q5 Min x 2 PRN Bobette Mo, MD       dipyridamole-aspirin [facility]) 200-25 MG per 12 hr capsule 1 capsule  1 capsule Oral BID Bobette Mo, MD   1 capsule at 06/04/23 7829   feeding supplement (ENSURE ENLIVE / ENSURE PLUS) liquid  237 mL  237 mL Oral BID BM Alekh, Kshitiz, MD       folic acid (FOLVITE) tablet 1 mg  1 mg Oral Daily Alekh, Kshitiz, MD   1 mg at 06/04/23 1148   insulin aspart (novoLOG) injection 0-9 Units  0-9 Units Subcutaneous TID WC Bobette Mo, MD   2 Units at 06/04/23 1645   insulin glargine-yfgn [facility]) injection 14 Units  14 Units Subcutaneous QHS Bobette Mo, MD   14 Units at 06/03/23 2145   labetalol (NORMODYNE) injection 10 mg  10 mg Intravenous Q2H PRN Bobette Mo, MD   10 mg at 06/03/23 1607   LORazepam (ATIVAN) tablet 0.5 mg  0.5 mg Oral TID PRN Glade Lloyd, MD       ondansetron [facility]) tablet 4 mg  4 mg Oral Q6H PRN Bobette Mo, MD       Or   ondansetron [facility]) injection 4 mg  4 mg Intravenous Q6H PRN Bobette Mo, MD   4 mg at 06/03/23 1420   Oral care mouth rinse  15 mL Mouth Rinse PRN Opyd, Lavone Neri, MD       pantoprazole (PROTONIX) EC tablet 40 mg  40 mg Oral Daily Norva Pavlov, RPH   40 mg at 06/04/23 5621   rosuvastatin (CRESTOR) tablet 10 mg  10 mg Oral Daily Norva Pavlov, RPH   10 mg at 06/04/23 3086     Discharge Medications: Please see discharge summary for a list of discharge medications.  Relevant Imaging Results:  Relevant Lab Results:   Additional Information SSN [id number]  Darleene Cleaver, LCSW

## 2023-06-04 NOTE — TOC Initial Note (Signed)
Transition of Care [facility]) - Initial/Assessment Note    Patient Details  Name: Donna Green MRN: [id number] Date of Birth: Feb 03, 1941  Transition of Care [facility]) CM/SW Contact:    Darleene Cleaver, LCSW Phone Number: 06/04/2023, 4:16 PM  Clinical Narrative:                  Patient is an 82 year old female who is alert and oriented x1.  CSW attempted to contact patient's daughter to complete assessment, but unable to get a hold of her.  CSW completed assessment by reviewing patient's chart.  Patient lives with her daughter.  Patient was admitted due to having a fall at home.  Patient also had some confusion on admission.  Patient was seen by PT and Cardiology.  PT is recommending SNF placement.  Palliative is also planning to meet with patient and her family tomorrow to discuss GOC.  TOC to continue to follow patient's progress throughout discharge planning.  Expected Discharge Plan: Skilled Nursing Facility Barriers to Discharge: Continued Medical Work up, Other palliative to meet to discuss GOC  Patient Goals and CMS Choice Patient states their goals for this hospitalization and ongoing recovery are:: To go to rehab and then return back home.          Expected Discharge Plan and Services In-house Referral: Clinical Social Work   Post Acute Care Choice: Skilled Nursing Facility Living arrangements for the past 2 months: Single Family Home                                      Prior Living Arrangements/Services Living arrangements for the past 2 months: Single Family Home Lives with:: Adult Children Patient language and need for interpreter reviewed:: Yes Do you feel safe going back to the place where you live?: No   Patient will need short term rehab then return back home.  Need for Family Participation in Patient Care: Yes (Comment) Care giver support system in place?: No (comment)   Criminal Activity/Legal Involvement Pertinent to Current Situation/Hospitalization:  No - Comment as needed  Activities of Daily Living   ADL Screening (condition at time of admission) Independently performs ADLs?: No Does the patient have a NEW difficulty with bathing/dressing/toileting/self-feeding that is expected to last >3 days?: No Does the patient have a NEW difficulty with getting in/out of bed, walking, or climbing stairs that is expected to last >3 days?: Yes (Initiates electronic notice to provider for possible PT consult) Does the patient have a NEW difficulty with communication that is expected to last >3 days?: No Is the patient deaf or have difficulty hearing?: Yes Does the patient have difficulty seeing, even when wearing glasses/contacts?: No Does the patient have difficulty concentrating, remembering, or making decisions?: No  Permission Sought/Granted Permission sought to share information with : Case Manager, Magazine features editor, Family Supports Permission granted to share information with : Yes, Release of Information Signed, Yes, Verbal Permission Granted  Share Information with NAME: Keondria, Laur Daughter [phone]  [phone]  Leonor Liv   [phone]  Permission granted to share info w AGENCY: SNF admissions        Emotional Assessment Appearance:: Appears stated age Attitude/Demeanor/Rapport: Other (comment) (Confused) Affect (typically observed): Accepting, Calm, Pleasant, Quiet Orientation: : Oriented to Self Alcohol / Substance Use: Not Applicable Psych Involvement: No (comment)  Admission diagnosis:  Hypertensive crisis [I16.9] Hypertensive emergency [I16.1] Fall, initial encounter [W19.XXXA]  A-fib [facility]) [I48.91] Patient Active Problem List   Diagnosis Date Noted   A-fib (HCC) 06/03/2023   Hypertensive crisis 06/02/2023   Hypokalemia 06/02/2023   Hypomagnesemia 06/02/2023   Large hiatal hernia 06/02/2023   Atrial fibrillation with RVR (HCC) 06/02/2023   Closed compression fracture of L1  vertebra (HCC) 06/02/2023   COVID-19 virus infection 03/23/2022   Anxiety 03/23/2022   Acute kidney injury superimposed on chronic kidney disease (HCC) 03/22/2022   Essential hypertension 03/09/2015   HLD (hyperlipidemia) 03/09/2015   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   History of stroke 03/07/2015   PCP:  Renford Dills, MD Pharmacy:   [facility] [phone] [facility] - [place], [place] - [address] [place] [address] Phone: [phone] Fax: [phone]  [facility] [address] [place] [address] Phone: [phone] Fax: [phone]     Social Determinants of Health (SDOH) Social History: SDOH Screenings   Food Insecurity: No Food Insecurity (06/03/2023)  Housing: Low Risk  (06/03/2023)  Transportation Needs: No Transportation Needs (06/03/2023)  Utilities: Not At Risk (06/03/2023)  Depression (PHQ2-9): Low Risk  (10/13/2022)  Tobacco Use: Low Risk  (06/01/2023)   SDOH Interventions:     Readmission Risk Interventions    03/26/2022    2:08 PM  Readmission Risk Prevention Plan  Transportation Screening Complete  PCP or Specialist Appt within 5-7 Days Complete  Home Care Screening Complete  Medication Review (RN CM) Complete

## 2023-06-04 NOTE — Progress Notes (Signed)
PROGRESS NOTE    Donna Green  WUJ:[id number] DOB: Jun 12, 1941 DOA: 06/01/2023 PCP: Renford Dills, MD   Brief Narrative:  82 y.o. 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  On presentation, her blood pressure was elevated to 209/108 and she was found to be in new onset A-fib with RVR.  Troponins were mildly elevated at 100, then 104, then 105, then 92.  Chest x-ray showed no acute cardiopulmonary process but showed a large hiatal hernia.  CT of the head with no acute intracranial abnormality.  CT cervical spine showed no acute fractures.  CT abdomen/pelvis showed mild L1 superior endplate central compression fracture with slight posterior bowing of the dorsal vertebral cortex but no significant anterior posterior height loss; large hiatal hernia with inverted intrathoracic stomach; diverticulosis without diverticulitis.  Left knee x-ray showed no fracture or effusion.  Left femur x-ray with no acute findings.  Cardiology consulted.  Palliative care consulted for goals of care discussion.  Assessment & Plan:   New onset paroxysmal A-fib with RVR -Presented with new onset A-fib with RVR.  Cardiology following.  Currently on IV metoprolol.  Currently rate controlled.  Cardiology recommending to start oral Eliquis 2.5 mg twice a day once able to take orally.  Echo showed EF of 65 to 70%.  Elevated troponins -Troponins did not trend up significantly.  No chest pain.  Cardiology following  Acute metabolic encephalopathy -Patient presented with fall and trauma to head but no loss of consciousness.  CT of the head was negative for acute intracranial abnormality. -Unclear if the patient has concussion or has undiagnosed dementia. -Has had intermittent confusion  and restlessness: Dilaudid discontinued.  Patient had issues with prolonged confusion in the past after IV narcotics.  Will avoid narcotics.   -Monitor mental status.  Fall precautions.  PT eval.  Fever -Having intermittent fevers with Tmax of 100.7 over the last 24 hours.  Check blood cultures, UA, urine culture, chest x-ray.  Check procalcitonin in AM.  Monitor off antibiotics at this time.  Close compression fracture of L1 vertebra -Continue thoracolumbar corset.  Fall precautions.  PT evaluation.  Pain management.  Large hiatal hernia -Continue PPI.  Outpatient follow-up with general surgery  History of stroke with residual right-sided deficit Hyperlipidemia -Supportive care.  Outpatient follow-up with neurology.  Continue dipyridamole-aspirin and statin  CKD stage IV -Baseline creatinine around 1.7-2.2.  Creatinine at baseline currently.  Monitor.  Acute metabolic acidosis -Bicarb improving.  Monitor  Hypertensive crisis in a patient with essential hypertension -Continue amlodipine and IV metoprolol.  Blood pressure improving.  Monitor blood pressure  Hypokalemia -Improved  Hypomagnesemia -Replace.  Repeat a.m. labs  Macrocytic anemia -Questionable cause.  Hemoglobin stable.  B12 on the lower side.  Start supplementation.  Start folic acid supplementation empirically once able to take orally.  Possible B12 deficiency -B12 196.  Start supplementation.  Anxiety -Switch as needed IV Ativan to oral once able to take orally  Diabetes mellitus type 2 with hyperglycemia -A1c 7.7.  Continue CBGs with SSI.  Continue long-acting insulin  Goals of care -Overall prognosis is guarded.  Currently listed as full code.  Palliative care consult pending  DVT prophylaxis: Lovenox Code Status: Full Family Communication: Granddaughter in law at bedside Disposition Plan: Status is: inpatient because: Of severity of illness.  PROGRESS NOTE    Donna Green  WUJ:[id number] DOB: Jun 12, 1941 DOA: 06/01/2023 PCP: Renford Dills, MD   Brief Narrative:  82 y.o. 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  On presentation, her blood pressure was elevated to 209/108 and she was found to be in new onset A-fib with RVR.  Troponins were mildly elevated at 100, then 104, then 105, then 92.  Chest x-ray showed no acute cardiopulmonary process but showed a large hiatal hernia.  CT of the head with no acute intracranial abnormality.  CT cervical spine showed no acute fractures.  CT abdomen/pelvis showed mild L1 superior endplate central compression fracture with slight posterior bowing of the dorsal vertebral cortex but no significant anterior posterior height loss; large hiatal hernia with inverted intrathoracic stomach; diverticulosis without diverticulitis.  Left knee x-ray showed no fracture or effusion.  Left femur x-ray with no acute findings.  Cardiology consulted.  Palliative care consulted for goals of care discussion.  Assessment & Plan:   New onset paroxysmal A-fib with RVR -Presented with new onset A-fib with RVR.  Cardiology following.  Currently on IV metoprolol.  Currently rate controlled.  Cardiology recommending to start oral Eliquis 2.5 mg twice a day once able to take orally.  Echo showed EF of 65 to 70%.  Elevated troponins -Troponins did not trend up significantly.  No chest pain.  Cardiology following  Acute metabolic encephalopathy -Patient presented with fall and trauma to head but no loss of consciousness.  CT of the head was negative for acute intracranial abnormality. -Unclear if the patient has concussion or has undiagnosed dementia. -Has had intermittent confusion  and restlessness: Dilaudid discontinued.  Patient had issues with prolonged confusion in the past after IV narcotics.  Will avoid narcotics.   -Monitor mental status.  Fall precautions.  PT eval.  Fever -Having intermittent fevers with Tmax of 100.7 over the last 24 hours.  Check blood cultures, UA, urine culture, chest x-ray.  Check procalcitonin in AM.  Monitor off antibiotics at this time.  Close compression fracture of L1 vertebra -Continue thoracolumbar corset.  Fall precautions.  PT evaluation.  Pain management.  Large hiatal hernia -Continue PPI.  Outpatient follow-up with general surgery  History of stroke with residual right-sided deficit Hyperlipidemia -Supportive care.  Outpatient follow-up with neurology.  Continue dipyridamole-aspirin and statin  CKD stage IV -Baseline creatinine around 1.7-2.2.  Creatinine at baseline currently.  Monitor.  Acute metabolic acidosis -Bicarb improving.  Monitor  Hypertensive crisis in a patient with essential hypertension -Continue amlodipine and IV metoprolol.  Blood pressure improving.  Monitor blood pressure  Hypokalemia -Improved  Hypomagnesemia -Replace.  Repeat a.m. labs  Macrocytic anemia -Questionable cause.  Hemoglobin stable.  B12 on the lower side.  Start supplementation.  Start folic acid supplementation empirically once able to take orally.  Possible B12 deficiency -B12 196.  Start supplementation.  Anxiety -Switch as needed IV Ativan to oral once able to take orally  Diabetes mellitus type 2 with hyperglycemia -A1c 7.7.  Continue CBGs with SSI.  Continue long-acting insulin  Goals of care -Overall prognosis is guarded.  Currently listed as full code.  Palliative care consult pending  DVT prophylaxis: Lovenox Code Status: Full Family Communication: Granddaughter in law at bedside Disposition Plan: Status is: inpatient because: Of severity of illness.  PROGRESS NOTE    Donna Green  WUJ:[id number] DOB: Jun 12, 1941 DOA: 06/01/2023 PCP: Renford Dills, MD   Brief Narrative:  82 y.o. 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  On presentation, her blood pressure was elevated to 209/108 and she was found to be in new onset A-fib with RVR.  Troponins were mildly elevated at 100, then 104, then 105, then 92.  Chest x-ray showed no acute cardiopulmonary process but showed a large hiatal hernia.  CT of the head with no acute intracranial abnormality.  CT cervical spine showed no acute fractures.  CT abdomen/pelvis showed mild L1 superior endplate central compression fracture with slight posterior bowing of the dorsal vertebral cortex but no significant anterior posterior height loss; large hiatal hernia with inverted intrathoracic stomach; diverticulosis without diverticulitis.  Left knee x-ray showed no fracture or effusion.  Left femur x-ray with no acute findings.  Cardiology consulted.  Palliative care consulted for goals of care discussion.  Assessment & Plan:   New onset paroxysmal A-fib with RVR -Presented with new onset A-fib with RVR.  Cardiology following.  Currently on IV metoprolol.  Currently rate controlled.  Cardiology recommending to start oral Eliquis 2.5 mg twice a day once able to take orally.  Echo showed EF of 65 to 70%.  Elevated troponins -Troponins did not trend up significantly.  No chest pain.  Cardiology following  Acute metabolic encephalopathy -Patient presented with fall and trauma to head but no loss of consciousness.  CT of the head was negative for acute intracranial abnormality. -Unclear if the patient has concussion or has undiagnosed dementia. -Has had intermittent confusion  and restlessness: Dilaudid discontinued.  Patient had issues with prolonged confusion in the past after IV narcotics.  Will avoid narcotics.   -Monitor mental status.  Fall precautions.  PT eval.  Fever -Having intermittent fevers with Tmax of 100.7 over the last 24 hours.  Check blood cultures, UA, urine culture, chest x-ray.  Check procalcitonin in AM.  Monitor off antibiotics at this time.  Close compression fracture of L1 vertebra -Continue thoracolumbar corset.  Fall precautions.  PT evaluation.  Pain management.  Large hiatal hernia -Continue PPI.  Outpatient follow-up with general surgery  History of stroke with residual right-sided deficit Hyperlipidemia -Supportive care.  Outpatient follow-up with neurology.  Continue dipyridamole-aspirin and statin  CKD stage IV -Baseline creatinine around 1.7-2.2.  Creatinine at baseline currently.  Monitor.  Acute metabolic acidosis -Bicarb improving.  Monitor  Hypertensive crisis in a patient with essential hypertension -Continue amlodipine and IV metoprolol.  Blood pressure improving.  Monitor blood pressure  Hypokalemia -Improved  Hypomagnesemia -Replace.  Repeat a.m. labs  Macrocytic anemia -Questionable cause.  Hemoglobin stable.  B12 on the lower side.  Start supplementation.  Start folic acid supplementation empirically once able to take orally.  Possible B12 deficiency -B12 196.  Start supplementation.  Anxiety -Switch as needed IV Ativan to oral once able to take orally  Diabetes mellitus type 2 with hyperglycemia -A1c 7.7.  Continue CBGs with SSI.  Continue long-acting insulin  Goals of care -Overall prognosis is guarded.  Currently listed as full code.  Palliative care consult pending  DVT prophylaxis: Lovenox Code Status: Full Family Communication: Granddaughter in law at bedside Disposition Plan: Status is: inpatient because: Of severity of illness.  06/04/23 0301  TSH 0.791   Anemia Panel: Recent Labs    06/04/23 0301  VITAMINB12 196   Sepsis Labs: No results for input(s): "PROCALCITON", "LATICACIDVEN" in the last 168 hours.  Recent Results (from the past 240 hour(s))  MRSA Next Gen by PCR, Nasal     Status: None   Collection Time: 06/02/23  8:54 AM   Specimen: Nasal Mucosa; Nasal Swab  Result Value Ref Range Status   MRSA by PCR Next Gen NOT DETECTED NOT DETECTED Final    Comment: (NOTE) The GeneXpert MRSA Assay (FDA approved for NASAL specimens only), is one component of a comprehensive MRSA colonization surveillance program. It is not intended to diagnose MRSA infection nor to guide or  monitor treatment for MRSA infections. Test performance is not FDA approved in patients less than 29 years old. Performed at [facility], [address], [place], [place] [address]          Radiology Studies: ECHOCARDIOGRAM COMPLETE  Result Date: 06/02/2023    ECHOCARDIOGRAM REPORT   Patient Name:   Donna Green Date of Exam: 06/02/2023 Medical Rec #:  [id number]      Height:       65.0 in Accession #:    [id number]     Weight:       166.7 lb Date of Birth:  1941/07/19      BSA:          1.831 m Patient Age:    81 years       BP:           158/74 mmHg Patient Gender: F              HR:           82 bpm. Exam Location:  Inpatient Procedure: 2D Echo, Cardiac Doppler, Color Doppler and Strain Analysis Indications:    Atrial Fibrillation I48.91  History:        Patient has no prior history of Echocardiogram examinations.                 Stroke; Risk Factors:Diabetes and Hypertension.  Sonographer:    Darlys Gales Referring Phys: [id number] DAVID MANUEL ORTIZ  Sonographer Comments: Global longitudinal strain was attempted. IMPRESSIONS  1. Difficult strain acquisition; consider future PYP testing or CMR testing. Left ventricular ejection fraction, by estimation, is 65 to 70%. The left ventricle has normal function. The left ventricle has no regional wall motion abnormalities. 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 Left ventricular diastolic parameters are indeterminate.  2. Right ventricular systolic function is normal. The right ventricular size is normal. Moderately increased right ventricular wall thickness. There is mildly elevated pulmonary artery systolic pressure. The estimated right ventricular systolic pressure  is 38.0 mmHg.  3. A small pericardial effusion is present. The pericardial effusion is circumferential.  4. The mitral valve is degenerative. Trivial mitral valve regurgitation. The mean mitral valve gradient is 3.7 mmHg with  average heart rate of 83 bpm. Moderate mitral annular calcification.  5. Tricuspid valve regurgitation is mild to moderate.  6. The aortic valve was not well visualized. There is moderate calcification of the aortic valve. Aortic valve regurgitation is not visualized. Aortic valve sclerosis is present, with no evidence of aortic valve stenosis.  7. The inferior vena cava is normal in size with greater than 50% respiratory variability, suggesting right atrial pressure of 3 mmHg. FINDINGS  Left Ventricle: Difficult strain acquisition; consider future  PROGRESS NOTE    Donna Green  WUJ:[id number] DOB: Jun 12, 1941 DOA: 06/01/2023 PCP: Renford Dills, MD   Brief Narrative:  82 y.o. 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  On presentation, her blood pressure was elevated to 209/108 and she was found to be in new onset A-fib with RVR.  Troponins were mildly elevated at 100, then 104, then 105, then 92.  Chest x-ray showed no acute cardiopulmonary process but showed a large hiatal hernia.  CT of the head with no acute intracranial abnormality.  CT cervical spine showed no acute fractures.  CT abdomen/pelvis showed mild L1 superior endplate central compression fracture with slight posterior bowing of the dorsal vertebral cortex but no significant anterior posterior height loss; large hiatal hernia with inverted intrathoracic stomach; diverticulosis without diverticulitis.  Left knee x-ray showed no fracture or effusion.  Left femur x-ray with no acute findings.  Cardiology consulted.  Palliative care consulted for goals of care discussion.  Assessment & Plan:   New onset paroxysmal A-fib with RVR -Presented with new onset A-fib with RVR.  Cardiology following.  Currently on IV metoprolol.  Currently rate controlled.  Cardiology recommending to start oral Eliquis 2.5 mg twice a day once able to take orally.  Echo showed EF of 65 to 70%.  Elevated troponins -Troponins did not trend up significantly.  No chest pain.  Cardiology following  Acute metabolic encephalopathy -Patient presented with fall and trauma to head but no loss of consciousness.  CT of the head was negative for acute intracranial abnormality. -Unclear if the patient has concussion or has undiagnosed dementia. -Has had intermittent confusion  and restlessness: Dilaudid discontinued.  Patient had issues with prolonged confusion in the past after IV narcotics.  Will avoid narcotics.   -Monitor mental status.  Fall precautions.  PT eval.  Fever -Having intermittent fevers with Tmax of 100.7 over the last 24 hours.  Check blood cultures, UA, urine culture, chest x-ray.  Check procalcitonin in AM.  Monitor off antibiotics at this time.  Close compression fracture of L1 vertebra -Continue thoracolumbar corset.  Fall precautions.  PT evaluation.  Pain management.  Large hiatal hernia -Continue PPI.  Outpatient follow-up with general surgery  History of stroke with residual right-sided deficit Hyperlipidemia -Supportive care.  Outpatient follow-up with neurology.  Continue dipyridamole-aspirin and statin  CKD stage IV -Baseline creatinine around 1.7-2.2.  Creatinine at baseline currently.  Monitor.  Acute metabolic acidosis -Bicarb improving.  Monitor  Hypertensive crisis in a patient with essential hypertension -Continue amlodipine and IV metoprolol.  Blood pressure improving.  Monitor blood pressure  Hypokalemia -Improved  Hypomagnesemia -Replace.  Repeat a.m. labs  Macrocytic anemia -Questionable cause.  Hemoglobin stable.  B12 on the lower side.  Start supplementation.  Start folic acid supplementation empirically once able to take orally.  Possible B12 deficiency -B12 196.  Start supplementation.  Anxiety -Switch as needed IV Ativan to oral once able to take orally  Diabetes mellitus type 2 with hyperglycemia -A1c 7.7.  Continue CBGs with SSI.  Continue long-acting insulin  Goals of care -Overall prognosis is guarded.  Currently listed as full code.  Palliative care consult pending  DVT prophylaxis: Lovenox Code Status: Full Family Communication: Granddaughter in law at bedside Disposition Plan: Status is: inpatient because: Of severity of illness.

## 2023-06-04 NOTE — Plan of Care (Signed)
  Problem: Clinical Measurements: Goal: Ability to maintain clinical measurements within normal limits will improve Outcome: Progressing Goal: Diagnostic test results will improve Outcome: Progressing   Problem: Coping: Goal: Level of anxiety will decrease Outcome: Progressing   Problem: Pain Managment: Goal: General experience of comfort will improve Outcome: Progressing   Problem: Safety: Goal: Ability to remain free from injury will improve Outcome: Progressing   Problem: Nutritional: Goal: Maintenance of adequate nutrition will improve Outcome: Progressing

## 2023-06-04 NOTE — Consult Note (Signed)
Palliative Care Consult Note                                  Date: 06/04/2023   Patient Name: Donna SPINALE  DOB: 14-Jan-1941  MRN: [id number]  Age / Sex: 82 y.o., female  PCP: Renford Dills, MD Referring Physician: Glade Lloyd, MD  Reason for Consultation: Establishing goals of care  HPI/Patient Profile: 82 y.o. 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 She was admitted on 06/01/2023 with new onset A-fib with RVR, acute metabolic encephalopathy, fevers, CKD stage IV, hypertensive crisis, and others.   PMT was consulted for GOC conversations.  Past Medical History:  Diagnosis Date   Anxiety 03/23/2022   Chronic kidney disease    Diabetes mellitus without complication (HCC)    Headache    Hyperlipidemia    Hypertension    Right hemiparesis (HCC)    Small bowel obstruction (HCC)    Stroke (HCC)    x2    Subjective:   This NP Wynne Dust reviewed medical records, received report from team, assessed the patient and then meet at the patient's bedside to discuss diagnosis, prognosis, GOC, EOL wishes disposition and options.  I met with the patient at bedside, no family was present today.  Later I attempted to call the patient's daughter but cell phone is not in working order.  I called the home and spoke with the patient's grandson who said he would pass the message on.  Later in the day I received a message to call back the patient's granddaughter in law.  After discussion we made arrangements for a family meeting tomorrow at 10:30 AM.   We meet to discuss diagnosis prognosis, GOC, EOL wishes, disposition and options. Concept of Palliative Care was introduced as specialized medical care for people and  their families living with serious illness.  If focuses on providing relief from the symptoms and stress of a serious illness.  The goal is to improve quality of life for both the patient and the family. Values and goals of care important to patient and family were attempted to be elicited.  Created space and opportunity for patient  and family to explore thoughts and feelings regarding current medical situation   Natural trajectory and current clinical status were discussed. Questions and concerns addressed. Patient  encouraged to call with questions or concerns.    Patient/Family Understanding of Illness: The patient is still somewhat sleepy and cannot confirm she has capacity.  However, she does understand that she is in the hospital, at [facility].  She knows the year and the month  She initially said she is in the hospital because she had a heart attack.  However, I clarified that she had a heart arrhythmia that caused her to pass out, fall, hit her head.  She then agrees "that tried I did hit my head."  Life Review: Deferred until family meeting  Patient Values: Deferred until family meeting  Goals: Deferred until family meeting  Today's Discussion: In addition to discussion described above, I obtained permission to call her daughter and arrange a time to sit down and talk.  She denies ongoing pain, nausea, vomiting, dyspnea.  She does states she is cold.  I obtained help of the bedside nurse to get some warm blankets which we placed on her to help her feel warmer.  I provided emotional and general support through therapeutic listening, empathy, sharing of stories, and other techniques. I answered all questions and addressed all concerns to the best of my ability.  Review of Systems  Objective:   Primary Diagnoses: Present on Admission:  Hypertensive crisis  Type 2 diabetes mellitus with other circulatory complications (HCC)  HLD (hyperlipidemia)  Essential hypertension   Anxiety  Hypokalemia  Hypomagnesemia  Atrial fibrillation with RVR (HCC)  Closed compression fracture of L1 vertebra (HCC)  A-fib (HCC)   Physical Exam Vitals and nursing note reviewed.  Constitutional:      General: She is sleeping. She is not in acute distress. HENT:     Head: Normocephalic and atraumatic.  Cardiovascular:     Rate and Rhythm: Normal rate.  Pulmonary:     Effort: Pulmonary effort is normal. No respiratory distress.  Abdominal:     General: Abdomen is flat. Bowel sounds are normal. There is no distension.     Palpations: Abdomen is soft.  Skin:    General: Skin is warm and dry.  Neurological:     Mental Status: She is oriented to person, place, and time and easily aroused.     Comments: Slow to respond  Psychiatric:        Mood and Affect: Mood normal.        Behavior: Behavior normal.     Vital Signs:  BP (!) 126/55   Pulse 80   Temp 98.2 F (36.8 C) (Axillary)   Resp 15   Ht 5\' 5"  (1.651 m)   Wt 76.8 kg   SpO2 92%   BMI 28.18 kg/m   Palliative Assessment/Data: 30%    Advanced Care Planning:   Existing Vynca/ACP Documentation: MOST form signed 03/22/2020  Primary Decision Maker: NEXT OF KIN  Code Status/Advance Care Planning: Full code  A discussion was had today regarding advanced directives. Concepts specific to code status, artifical feeding and hydration, continued IV antibiotics and rehospitalization was had.  The difference between a aggressive medical intervention path and a palliative comfort care path for this patient at this time was had.   Decisions/Changes to ACP: None today  Assessment & Plan:   Impression: 82 year old female with acute presentation chronic comorbidities as described above.  The patient does not seem to have capacity yet, slow to respond, although she is oriented x 3 and somewhat understands why she is in the hospital.  I did reach out to family and we are planning for a family meeting tomorrow morning  at 1030 so we can have a discussion about her current clinical situation and try to plan for goals of care.  Overall prognosis guarded  SUMMARY OF RECOMMENDATIONS   Remain full code, full scope for now Family meeting tomorrow at 10:30 AM Palliative medicine will continue to follow  Symptom Management:  Per primary team PMT is available to assist as needed  Prognosis:  Unable to determine  Discharge Planning:  To Be Determined   Discussed with: Patient, patient's family, medical team, nursing team    Thank you for allowing [place] to participate in the care of HUTTON VANO PMT will continue to support holistically.  Time Total: 40 min  Detailed review of medical records (labs, imaging, vital signs), medically appropriate exam, discussed with treatment team, counseling and education to patient, family, & staff, documenting clinical information,  medication management, coordination of care  Signed by: Wynne Dust, NP Palliative Medicine Team  Team Phone # [phone] (Nights/Weekends)  06/04/2023, 3:44 PM

## 2023-06-05 DIAGNOSIS — I161 Hypertensive emergency: Secondary | ICD-10-CM | POA: Diagnosis not present

## 2023-06-05 DIAGNOSIS — Z7189 Other specified counseling: Secondary | ICD-10-CM | POA: Diagnosis not present

## 2023-06-05 DIAGNOSIS — I169 Hypertensive crisis, unspecified: Secondary | ICD-10-CM | POA: Diagnosis not present

## 2023-06-05 DIAGNOSIS — Z515 Encounter for palliative care: Secondary | ICD-10-CM | POA: Diagnosis not present

## 2023-06-05 DIAGNOSIS — S32010A Wedge compression fracture of first lumbar vertebra, initial encounter for closed fracture: Secondary | ICD-10-CM

## 2023-06-05 DIAGNOSIS — K449 Diaphragmatic hernia without obstruction or gangrene: Secondary | ICD-10-CM

## 2023-06-05 LAB — BASIC METABOLIC PANEL
Anion gap: 6 (ref 5–15)
BUN: 36 mg/dL — ABNORMAL HIGH (ref 8–23)
CO2: 20 mmol/L — ABNORMAL LOW (ref 22–32)
Calcium: 7.8 mg/dL — ABNORMAL LOW (ref 8.9–10.3)
Chloride: 110 mmol/L (ref 98–111)
Creatinine, Ser: 2.56 mg/dL — ABNORMAL HIGH (ref 0.44–1.00)
GFR, Estimated: 18 mL/min — ABNORMAL LOW (ref >60)
Glucose, Bld: 170 mg/dL — ABNORMAL HIGH (ref 70–99)
Potassium: 4.1 mmol/L (ref 3.5–5.1)
Sodium: 136 mmol/L (ref 135–145)

## 2023-06-05 LAB — GLUCOSE, CAPILLARY
Glucose-Capillary: 150 mg/dL — ABNORMAL HIGH (ref 70–99)
Glucose-Capillary: 228 mg/dL — ABNORMAL HIGH (ref 70–99)
Glucose-Capillary: 154 mg/dL — ABNORMAL HIGH (ref 70–99)
Glucose-Capillary: 159 mg/dL — ABNORMAL HIGH (ref 70–99)
Glucose-Capillary: 179 mg/dL — ABNORMAL HIGH (ref 70–99)

## 2023-06-05 LAB — MAGNESIUM: Magnesium: 2.1 mg/dL (ref 1.7–2.4)

## 2023-06-05 MED ORDER — HALOPERIDOL 2 MG PO TABS
2.0000 mg | ORAL_TABLET | Freq: Three times a day (TID) | ORAL | Status: DC | PRN
  Administered 2023-06-05 – 2023-06-07 (×2): 2 mg via ORAL
  Filled 2023-06-05: qty 2
  Filled 2023-06-05 (×2): qty 1

## 2023-06-05 MED ORDER — TRAZODONE HCL 50 MG PO TABS
50.0000 mg | ORAL_TABLET | Freq: Once | ORAL | Status: AC
  Administered 2023-06-05: 50 mg via ORAL
  Filled 2023-06-05: qty 1

## 2023-06-05 NOTE — Progress Notes (Signed)
Much more alert. Denies dyspnea. Adequate BP control. Recommend stopping the aggrenox, since she is now on apixaban. [facility] will sign off.   Medication Recommendations:  apixaban 2.5 mg twice daily, carvedilol 12.5 mg twice daily. Other recommendations (labs, testing, etc):  outpatient PYP scan to evaluate for cardiac amyloidosis. Follow up as an outpatient:  will arrange f/u.

## 2023-06-05 NOTE — Progress Notes (Signed)
   Brief Palliative Medicine Progress Note:  PMT consult received and chart reviewed. Goals of care completed, full note to follow.  Recommendations: Remain full code and full scope Continue to treat the treatable Time for outcomes Family to bring living will to the hospital tomorrow Palliative medicine will continue to follow   Thank you for allowing [place] to participate in the care of Mahira W Hoeger  Signed by: Wynne Dust, NP Palliative Medicine Team [facility] CHARGE  Team Phone # [phone] (Nights/Weekends)  06/05/2023, 4:11 PM

## 2023-06-05 NOTE — Progress Notes (Signed)
PROGRESS NOTE    Donna Green  KVQ:[id number] DOB: 04-20-1941 DOA: 06/01/2023 PCP: Renford Dills, MD   Brief Narrative:  82 y.o. 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  On presentation, her blood pressure was elevated to 209/108 and she was found to be in new onset A-fib with RVR.  Troponins were mildly elevated at 100, then 104, then 105, then 92.  Chest x-ray showed no acute cardiopulmonary process but showed a large hiatal hernia.  CT of the head with no acute intracranial abnormality.  CT cervical spine showed no acute fractures.  CT abdomen/pelvis showed mild L1 superior endplate central compression fracture with slight posterior bowing of the dorsal vertebral cortex but no significant anterior posterior height loss; large hiatal hernia with inverted intrathoracic stomach; diverticulosis without diverticulitis.  Left knee x-ray showed no fracture or effusion.  Left femur x-ray with no acute findings.  Cardiology consulted.  Palliative care consulted for goals of care discussion.  Assessment & Plan:   New onset paroxysmal A-fib with RVR -Presented with a brief rapid A-fib.  Seen by cardiology.  Converted to sinus rhythm.  Echocardiogram with normal ejection fraction.   Currently rate controlled on carvedilol, started on Eliquis and tolerating.    Elevated troponins, type II myocardial injury. -Troponins did not trend up significantly.  No chest pain.  Cardiology following  Acute metabolic encephalopathy -Patient presented with fall and trauma to head but no loss of consciousness.  CT of the head was negative for acute intracranial abnormality. -Unclear if the patient has concussion or has undiagnosed dementia. -Has had intermittent confusion  and restlessness: Dilaudid discontinued.  Patient had issues with prolonged confusion in the past after IV narcotics.  Will avoid narcotics.   -Appropriately improving.  Recommend to continue PT OT.  Delirium precautions. Ammonia is normal.  B12 is 196 and borderline low.  Will give B12 injections while in the hospital and recommend oral B12 supplementation on discharge.  Fever -Having low grade fever.  Tmax of 100.2 over the last 24 hours.   Blood cultures negative so far.  Urine is essentially normal.  Chest x-ray is normal. Monitor off antibiotics at this time.  Close compression fracture of L1 vertebra -Continue thoracolumbar corset.  Fall precautions.  PT evaluation.  Pain management.  Large hiatal hernia -Continue PPI.  Outpatient follow-up with general surgery  History of stroke with residual right-sided deficit Hyperlipidemia -Supportive care.  Outpatient follow-up with neurology.  Continue dipyridamole-aspirin and statin  CKD stage IV -Baseline creatinine around 1.7-2.2.  Creatinine slightly elevated today.  Will monitor.  Monitor output.  Hypertensive crisis in a patient with essential hypertension -Continue amlodipine and IV metoprolol.  Blood pressure improving.  Monitor blood pressure  Hypokalemia/hypomagnesemia. -Improved  Macrocytic anemia -Questionable cause.  Hemoglobin stable.  B12 on the lower side.  Start supplementation.  Start folic acid supplementation empirically once able to take orally.  Possible B12 deficiency -B12 196.  Start supplementation.  Anxiety -Switch as needed IV Ativan to oral   Diabetes mellitus type 2 with hyperglycemia -A1c 7.7.  Continue CBGs with SSI.  Continue long-acting insulin  Goals of care -Overall prognosis is guarded.  Currently listed as full code.  Palliative care following. Palliative care meeting is scheduled today.  DVT prophylaxis: Lovenox Code Status: Full  Family Communication: None at the bedside. Disposition  Plan: Skilled nursing facility. Status is: inpatient because: Of severity of illness.    Consultants: Cardiology.  Palliative care  Procedures: Echo  Antimicrobials: None   Subjective: Patient seen and examined.  Sinus rhythm in the monitor.  Patient herself is poor historian.  She denies any complaints.  She tells me it hurts on her left back and on the epigastric area when she is coughing.  She is eating regular diet without difficulties.  Pleasant and confused.  Objective: Vitals:   06/05/23 0940 06/05/23 0942 06/05/23 1000 06/05/23 1031  BP: (S) (!) 91/35 (!) 144/64 (!) 95/45 (!) 131/55  Pulse: 63 66 64 77  Resp: 18 17 11 18   Temp:      TempSrc:      SpO2: 97% 98% 96% 97%  Weight:      Height:        Intake/Output Summary (Last 24 hours) at 06/05/2023 1043 Last data filed at 06/04/2023 1300 Gross per 24 hour  Intake 180 ml  Output 50 ml  Net 130 ml   Filed Weights   06/02/23 1100 06/04/23 0500 06/05/23 0700  Weight: 75.6 kg 76.8 kg 78.2 kg    Examination:  General: Chronically sick looking.  Not in any distress.  On room air. Patient is alert and awake.  She is oriented to herself and her family.  She is not oriented to place and time. Flat affect.  She has some weakness on the right side. Cardiovascular: S1-S2 normal.  Regular rate rhythm. Respiratory: Bilateral clear.  Poor inspiratory effort. Gastrointestinal: Soft.  Nontender.  Bowel sound present. Ext: No swelling or edema.  No cyanosis. Musculoskeletal: No deformities.      Data Reviewed: I have personally reviewed following labs and imaging studies  CBC: Recent Labs  Lab 06/02/23 0132 06/02/23 0519 06/03/23 0757 06/04/23 0301  WBC 8.7 7.5 7.8 6.6  NEUTROABS 6.3 4.4 6.4 4.1  HGB 12.8 11.6* 11.3* 10.5*  HCT 39.1 35.0* 35.9* 33.7*  MCV 101.8* 101.7* 107.2* 106.3*  PLT 237 212 162 177   Basic Metabolic Panel: Recent Labs  Lab 06/02/23 0132 06/02/23 0519 06/03/23 0757 06/04/23 0301  06/05/23 0254  NA 141 140 137 138 136  K 3.9 3.3* 3.7 3.5 4.1  CL 104 107 109 110 110  CO2 26 21* 18* 20* 20*  GLUCOSE 218* 216* 204* 201* 170*  BUN 29* 29* 31* 35* 36*  CREATININE 2.09* 1.98* 2.09* 2.16* 2.56*  CALCIUM 8.9 8.3* 7.9* 7.8* 7.8*  MG  --  1.1* 1.7 1.6* 2.1   GFR: Estimated Creatinine Clearance: 17.2 mL/min (A) (by C-G formula based on SCr of 2.56 mg/dL (H)). Liver Function Tests: Recent Labs  Lab 06/02/23 0519 06/04/23 0301  AST 19 29  ALT 11 15  ALKPHOS 42 40  BILITOT 0.4 0.7  PROT 6.4* 6.3*  ALBUMIN 3.3* 2.9*   No results for input(s): "LIPASE", "AMYLASE" in the last 168 hours. Recent Labs  Lab 06/04/23 0301  AMMONIA 28   Coagulation Profile: No results for input(s): "INR", "PROTIME" in the last 168 hours. Cardiac Enzymes: No results for input(s): "CKTOTAL", "CKMB", "CKMBINDEX", "TROPONINI" in the last 168 hours. BNP (last 3 results) No results for input(s): "PROBNP" in the last 8760 hours. HbA1C: Recent Labs    06/03/23 0254  HGBA1C 7.7*   CBG: Recent Labs  Lab 06/04/23 0810 06/04/23 1143 06/04/23 1626 06/04/23 2214 06/05/23 0804  GLUCAP 157* 173* 178* 132* 150*  Lipid Profile: No results for input(s): "CHOL", "HDL", "LDLCALC", "TRIG", "CHOLHDL", "LDLDIRECT" in the last 72 hours. Thyroid Function Tests: Recent Labs    06/04/23 0301  TSH 0.791   Anemia Panel: Recent Labs    06/04/23 0301  VITAMINB12 196   Sepsis Labs: No results for input(s): "PROCALCITON", "LATICACIDVEN" in the last 168 hours.  Recent Results (from the past 240 hour(s))  MRSA Next Gen by PCR, Nasal     Status: None   Collection Time: 06/02/23  8:54 AM   Specimen: Nasal Mucosa; Nasal Swab  Result Value Ref Range Status   MRSA by PCR Next Gen NOT DETECTED NOT DETECTED Final    Comment: (NOTE) The GeneXpert MRSA Assay (FDA approved for NASAL specimens only), is one component of a comprehensive MRSA colonization surveillance program. It is not intended to  diagnose MRSA infection nor to guide or monitor treatment for MRSA infections. Test performance is not FDA approved in patients less than 75 years old. Performed at [facility], [address] [place], [place] [address]   Culture, blood (Routine X 2) w Reflex to ID Panel     Status: None (Preliminary result)   Collection Time: 06/04/23  8:20 AM   Specimen: BLOOD  Result Value Ref Range Status   Specimen Description   Final    BLOOD BLOOD LEFT ARM ANAEROBIC BOTTLE ONLY Performed at [facility], [address], [place], [place] [address]    Special Requests   Final    BOTTLES DRAWN AEROBIC AND ANAEROBIC Blood Culture adequate volume Performed at [facility], [address], [place], [place] [address]    Culture   Final    NO GROWTH < 24 HOURS Performed at [facility], [address] [place], [place] [address]    Report Status PENDING  Incomplete  Culture, blood (Routine X 2) w Reflex to ID Panel     Status: None (Preliminary result)   Collection Time: 06/04/23  8:20 AM   Specimen: BLOOD  Result Value Ref Range Status   Specimen Description   Final    BLOOD BLOOD LEFT HAND ANAEROBIC BOTTLE ONLY Performed at [facility], [address], [place], [place] [address]    Special Requests   Final    BOTTLES DRAWN AEROBIC AND ANAEROBIC Blood Culture adequate volume Performed at [facility], [address], [place], [place] [address]    Culture   Final    NO GROWTH < 24 HOURS Performed at [facility], [address], [place], [place] [address]    Report Status PENDING  Incomplete         Radiology Studies: DG CHEST PORT 1 VIEW  Result Date: 06/04/2023 CLINICAL DATA:  Dyspnea. EXAM: PORTABLE CHEST 1 VIEW COMPARISON:  June 02, 2023. FINDINGS: Stable cardiomegaly. Stable large hiatal hernia. Both lungs are clear. The visualized skeletal structures are unremarkable.  IMPRESSION: No active disease.  Stable large hiatal hernia. Electronically Signed   By: Lupita Raider M.D.   On: 06/04/2023 12:24        Scheduled Meds:  amLODipine  5 mg Oral Daily   apixaban  2.5 mg Oral BID   carvedilol  12.5 mg Oral BID WC   Chlorhexidine Gluconate Cloth  6 each Topical Daily   cyanocobalamin  1,000 mcg Intramuscular Q0600   dipyridamole-aspirin  1 capsule Oral BID   feeding supplement  237 mL Oral BID BM  folic acid  1 mg Oral Daily   insulin aspart  0-9 Units Subcutaneous TID WC   insulin glargine-yfgn  14 Units Subcutaneous QHS   pantoprazole  40 mg Oral Daily   rosuvastatin  10 mg Oral Daily   Continuous Infusions:        Dorcas Carrow, MD [facility] 06/05/2023, 10:43 AM

## 2023-06-05 NOTE — Progress Notes (Signed)
Daily Progress Note   Patient Name: Donna Green       Date: 06/05/2023 DOB: 1941-02-26  Age: 82 y.o. MRN#: [id number] Attending Physician: Dorcas Carrow, MD Primary Care Physician: Renford Dills, MD Admit Date: 06/01/2023 Length of Stay: 2 days  Reason for Consultation/Follow-up: {Reason for Consult:23484}  HPI/Patient Profile:  ***  Subjective:   Subjective: Chart Reviewed. Updates received. Patient Assessed. Created space and opportunity for patient  and family to explore thoughts and feelings regarding current medical situation.  Today's Discussion: ***  Review of Systems  Objective:   Vital Signs:  BP (!) 131/55 (BP Location: Left Arm)   Pulse 78   Temp 99.3 F (37.4 C) (Oral)   Resp 20   Ht 5\' 5"  (1.651 m)   Wt 78.2 kg   SpO2 99%   BMI 28.69 kg/m   Physical Exam: Physical Exam  Palliative Assessment/Data: ***    Existing Vynca/ACP Documentation: ***  Assessment & Plan:   Impression: Present on Admission:  Hypertensive crisis  Type 2 diabetes mellitus with other circulatory complications (HCC)  HLD (hyperlipidemia)  Essential hypertension  Anxiety  Hypokalemia  Hypomagnesemia  Atrial fibrillation with RVR (HCC)  Closed compression fracture of L1 vertebra (HCC)  A-fib (HCC)  ***  SUMMARY OF RECOMMENDATIONS   ***  Symptom Management:  ***  Code Status: {Palliative Code status:23503}  Prognosis: {Palliative Care Prognosis:23504}  Discharge Planning: {Palliative dispostion:23505}  Discussed with: ***  Thank you for allowing [place] to participate in the care of Lashonda W Danziger PMT will continue to support holistically.  Time Total: ***  Detailed review of medical records (labs, imaging, vital signs), medically appropriate exam, discussed with treatment team, counseling and education to patient, family, & staff, documenting clinical information, medication management, coordination of care  Wynne Dust, NP Palliative Medicine  Team  Team Phone # [phone] (Nights/Weekends)  04/11/2021, 8:17 AM

## 2023-06-05 NOTE — Evaluation (Signed)
Occupational Therapy Evaluation Patient Details Name: Donna Green MRN: [id number] DOB: September 02, 1940 Today's Date: 06/05/2023   History of Present Illness Patient is 82 y.o. female admitted on 06/02/2023 for the evaluation of afib after sustaining a fall from home. CT of head and C-spine negative for acute injury, CT of abdomen/pelvis without contrast showing a mild L1 superior endplate central compression fx w/ slight posterior bowing of the dorsal vertebral cortex but no significant anterior posterior height loss. 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 Patient was living at home with daughter with some assist for ADLs. Family not available to answer PLOF questions at this time. Patient was +2 for bed mobility with increased pain in back with all movements. Patients BP noted to drop to 91/35 mmhg sitting EOB with patient returned to bed. Patients BP was 144/64 mmhg supine in bed. Patient was noted to have decreased functional activity tolernace, decreased ROM, decreased BUE strength, decreased endurance, decreased sitting balance, decreased standing balanced, decreased safety awareness, and decreased knowledge of AE/AD impacting participation in ADLs. Patient will benefit from continued inpatient follow up therapy, <3 hours/day       If plan is discharge home, recommend the following: Two people to help with walking and/or transfers;Two people to help with bathing/dressing/bathroom;Assistance with cooking/housework;Direct supervision/assist for medications management;Assist for transportation;Help with stairs or ramp for entrance;Direct supervision/assist for financial management    Functional Status Assessment  Patient has had a recent decline in their functional status and demonstrates the ability to make significant improvements in function in a reasonable and  predictable amount of time.  Equipment Recommendations  None recommended by OT    Recommendations for Other Services       Precautions / Restrictions Precautions Precautions: Fall Precaution Comments: did place LSO when in sitting,RN states LSO disconitued, order appears   still active, Required Braces or Orthoses: Spinal Brace Spinal Brace: Thoracolumbosacral orthotic;Applied in sitting position Restrictions Weight Bearing Restrictions: No      Mobility Bed Mobility Overal bed mobility: Needs Assistance Bed Mobility: Supine to Sit, Sit to Supine, Rolling Rolling: +2 for safety/equipment, +2 for physical assistance, Max assist   Supine to sit: Max assist, +2 for physical assistance, +2 for safety/equipment, HOB elevated Sit to supine: Total assist, +2 for physical assistance, +2 for safety/equipment   General bed mobility comments: multimodal  cues, assist with LLE to flex to roll , assist  with legs and trunk, patient appeared less alert, BP takesnm, dropin BP so retruned to suppine    Transfers                          Balance Overall balance assessment: Needs assistance, History of Falls Sitting-balance support: Bilateral upper extremity supported, Feet unsupported Sitting balance-Leahy Scale: Poor Sitting balance - Comments: head forward Postural control: Left lateral lean                                 ADL either performed or assessed with clinical judgement   ADL Overall ADL's : Needs assistance/impaired Eating/Feeding: Maximal assistance;Bed level   Grooming: Bed level;Maximal assistance   Upper Body Bathing: Bed level;Total assistance   Lower Body Bathing: Bed level;Total assistance   Upper Body Dressing : Bed level;Total  assistance Upper Body Dressing Details (indicate cue type and reason): TD to don TLSO Lower Body Dressing: Bed level;Total assistance     Toilet Transfer Details (indicate cue type and reason): patient was +2  to advance to EOB with helicopter method secondary to pain with attempts at log rolling on back. Toileting- Clothing Manipulation and Hygiene: Bed level;Total assistance               Vision         Perception         Praxis         Pertinent Vitals/Pain Pain Assessment Pain Assessment: Faces Faces Pain Scale: Hurts even more Pain Location: back Pain Descriptors / Indicators: Discomfort, Grimacing, Guarding, Moaning Pain Intervention(s): Monitored during session, Repositioned     Extremity/Trunk Assessment Upper Extremity Assessment Upper Extremity Assessment: Difficult to assess due to impaired cognition   Lower Extremity Assessment Lower Extremity Assessment: Defer to PT evaluation   Cervical / Trunk Assessment Cervical / Trunk Assessment: Other exceptions;Kyphotic Cervical / Trunk Exceptions: listing to the left   Communication Communication Communication: Hearing impairment   Cognition Arousal: Alert Behavior During Therapy: Flat affect Overall Cognitive Status: No family/caregiver present to determine baseline cognitive functioning Area of Impairment: Orientation                 Orientation Level: Situation, Time             General Comments: patient reported that today was wednsday, october, 2040 then was able to correct to 2024 after she said it with no cues. patient was lethargic and HOH impacting full assessment of cog. sttaed that she thought that she was at [facility] but was told at [facility]     General Comments       Exercises     Shoulder Instructions      Home Living                                          Prior Functioning/Environment                          OT Problem List: Decreased activity tolerance;Impaired balance (sitting and/or standing);Decreased coordination;Decreased safety awareness;Decreased knowledge of precautions;Decreased knowledge of use of DME or AE;Cardiopulmonary status limiting  activity;Pain      OT Treatment/Interventions: Self-care/ADL training;Therapeutic exercise;DME and/or AE instruction;Therapeutic activities;Patient/family education;Balance training    OT Goals(Current goals can be found in the care plan section) Acute Rehab OT Goals Patient Stated Goal: none stated OT Goal Formulation: Patient unable to participate in goal setting Time For Goal Achievement: 06/19/23 Potential to Achieve Goals: Fair  OT Frequency: Min 1X/week    Co-evaluation   Reason for Co-Treatment: To address functional/ADL transfers PT goals addressed during session: Mobility/safety with mobility OT goals addressed during session: ADL's and self-care      AM-PAC OT "6 Clicks" Daily Activity     Outcome Measure Help from another person eating meals?: A Lot Help from another person taking care of personal grooming?: Total Help from another person toileting, which includes using toliet, bedpan, or urinal?: Total Help from another person bathing (including washing, rinsing, drying)?: Total Help from another person to put on and taking off regular upper body clothing?: Total Help from another person to put on and taking off regular lower body clothing?: Total 6 Click Score:  7   End of Session Equipment Utilized During Treatment: Other (comment) (TLSO) Nurse Communication: Other (comment) (BP during session)  Activity Tolerance: Patient limited by fatigue;Patient limited by pain Patient left: in bed;with call bell/phone within reach;with bed alarm set  OT Visit Diagnosis: Unsteadiness on feet (R26.81);Other abnormalities of gait and mobility (R26.89);Muscle weakness (generalized) (M62.81)                Time: 5784-6962 OT Time Calculation (min): 22 min Charges:  OT General Charges $OT Visit: 1 Visit OT Evaluation $OT Eval Low Complexity: 1 Low  Zaeden Lastinger OTR/L, MS Acute Rehabilitation Department Office# [phone]   Selinda Flavin 06/05/2023, 3:48 PM

## 2023-06-05 NOTE — Progress Notes (Signed)
Physical Therapy Treatment Patient Details Name: Donna Green MRN: [id number] DOB: 29-Sep-1940 Today's Date: 06/05/2023   History of Present Illness Patient is 82 y.o. female admitted on 06/02/2023 for the evaluation of afib after sustaining a fall from home. CT of head and C-spine negative for acute injury, CT of abdomen/pelvis without contrast showing a mild L1 superior endplate central compression fx w/ slight posterior bowing of the dorsal vertebral cortex but no significant anterior posterior height loss. 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  Patient assisted to sitting,\. While in sitting, patient reported feeling poorly, appeared with decreased arousal, did respond when asked questions. BP  91/35 down from  144/68. RN notified, patient assisted back  to supine.   If plan is discharge home, recommend the following: Two people to help with walking and/or transfers;Assistance with cooking/housework;Assist for transportation;Help with stairs or ramp for entrance;Two people to help with bathing/dressing/bathroom   Can travel by private vehicle     No  Equipment Recommendations  None recommended by PT    Recommendations for Other Services       Precautions / Restrictions Precautions Precautions: Fall Precaution Comments: did place LSO when in sitting,RN states LSO disconitued, order appears   still active, Required Braces or Orthoses: Spinal Brace Spinal Brace: Thoracolumbosacral orthotic;Applied in sitting position Restrictions Weight Bearing Restrictions: No     Mobility  Bed Mobility   Bed Mobility: Supine to Sit, Sit to Supine, Rolling Rolling: +2 for safety/equipment, +2 for physical assistance, Max assist   Supine to sit: Max assist, +2 for physical assistance, +2 for safety/equipment, HOB elevated Sit to supine:  Total assist, +2 for physical assistance, +2 for safety/equipment   General bed mobility comments: multimodal  cues, assist with LLE to flex to roll , assist  with legs and trunk, patient appeared less alert, BP takesnm, dropin BP so retruned to suppine    Transfers                        Ambulation/Gait                   Stairs             Wheelchair Mobility     Tilt Bed    Modified Rankin (Stroke Patients Only)       Balance Overall balance assessment: Needs assistance, History of Falls Sitting-balance support: Bilateral upper extremity supported, Feet unsupported Sitting balance-Leahy Scale: Poor Sitting balance - Comments: head forward                                    Cognition Arousal: Alert Behavior During Therapy: Flat affect Overall Cognitive Status: No family/caregiver present to determine baseline cognitive functioning Area of Impairment: Orientation                 Orientation Level: Situation, Time             General Comments: patient reported that today was wednsday, october, 2040 then was able to correct to 2024 after she said it with no cues. patient was lethargic and HOH impacting full assessment of cog. sttaed that she thought that she was at [facility] but was told at [facility]  Exercises      General Comments        Pertinent Vitals/Pain Pain Assessment Faces Pain Scale: Hurts even more Pain Location: back Pain Descriptors / Indicators: Discomfort, Grimacing, Guarding, Moaning Pain Intervention(s): Monitored during session, Repositioned    Home Living Family/patient expects to be discharged to:: Private residence Living Arrangements: Children Available Help at Discharge: Available 24 hours/day Type of Home: House Home Access: Stairs to enter Entrance Stairs-Rails: None Entrance Stairs-Number of Steps: 2 Alternate Level Stairs-Number of Steps: ? 2 Home Layout: Multi-level Home Equipment:  Agricultural consultant (2 wheels) Additional Comments: patient was [name] with hard time hearing therapist.    Prior Function            PT Goals (current goals can now be found in the care plan section) Progress towards PT goals: Progressing toward goals    Frequency    Min 1X/week      PT Plan      Co-evaluation PT/OT/SLP Co-Evaluation/Treatment: Yes Reason for Co-Treatment: To address functional/ADL transfers PT goals addressed during session: Mobility/safety with mobility OT goals addressed during session: ADL's and self-care      AM-PAC PT "6 Clicks" Mobility   Outcome Measure  Help needed turning from your back to your side while in a flat bed without using bedrails?: Total Help needed moving from lying on your back to sitting on the side of a flat bed without using bedrails?: Total Help needed moving to and from a bed to a chair (including a wheelchair)?: Total Help needed standing up from a chair using your arms (e.g., wheelchair or bedside chair)?: Total Help needed to walk in hospital room?: Total Help needed climbing 3-5 steps with a railing? : Total 6 Click Score: 6    End of Session Equipment Utilized During Treatment: Oxygen Activity Tolerance: Patient limited by fatigue;Patient limited by pain;Treatment limited secondary to medical complications (Comment) Patient left: in bed;with call bell/phone within reach;with nursing/sitter in room Nurse Communication: Mobility status;Need for lift equipment (drop in B{P in sittingg) PT Visit Diagnosis: Muscle weakness (generalized) (M62.81);History of falling (Z91.81);Difficulty in walking, not elsewhere classified (R26.2)     Time: 1610-9604 PT Time Calculation (min) (ACUTE ONLY): 23 min  Charges:    $Therapeutic Activity: 8-22 mins PT General Charges $$ ACUTE PT VISIT: 1 Visit               Blanchard Kelch PT Acute Rehabilitation Services Office [phone] Weekend pager-[phone]    Rada Hay 06/05/2023, 12:54 PM

## 2023-06-06 DIAGNOSIS — I169 Hypertensive crisis, unspecified: Secondary | ICD-10-CM | POA: Diagnosis not present

## 2023-06-06 DIAGNOSIS — I4891 Unspecified atrial fibrillation: Secondary | ICD-10-CM | POA: Diagnosis not present

## 2023-06-06 DIAGNOSIS — W19XXXA Unspecified fall, initial encounter: Secondary | ICD-10-CM | POA: Diagnosis not present

## 2023-06-06 DIAGNOSIS — Z7189 Other specified counseling: Secondary | ICD-10-CM | POA: Diagnosis not present

## 2023-06-06 DIAGNOSIS — Z515 Encounter for palliative care: Secondary | ICD-10-CM | POA: Diagnosis not present

## 2023-06-06 LAB — GLUCOSE, CAPILLARY
Glucose-Capillary: 149 mg/dL — ABNORMAL HIGH (ref 70–99)
Glucose-Capillary: 173 mg/dL — ABNORMAL HIGH (ref 70–99)
Glucose-Capillary: 254 mg/dL — ABNORMAL HIGH (ref 70–99)
Glucose-Capillary: 217 mg/dL — ABNORMAL HIGH (ref 70–99)

## 2023-06-06 MED ORDER — INSULIN ASPART 100 UNIT/ML IJ SOLN
2.0000 [IU] | Freq: Once | INTRAMUSCULAR | Status: AC
  Administered 2023-06-06: 2 [IU] via SUBCUTANEOUS

## 2023-06-06 NOTE — TOC CM/SW Note (Signed)
CMS list of facilities and star ratings provided to pt to review for facility preference.       [facility] [address] [place], [place] [address] [phone] Overall rating ??  Below average  [facility] at the [facility] [address] [place], [place] [address] [phone] Overall rating ?? Below average  [facility] [address] [place], [place] [address] [phone] Overall rating?? Below average  [facility] [address] [place], [place] [address] [phone] Overall rating ? Much below average  [facility] [address] [place], [place] [address] [phone] Overall rating ??? Average  [facility] and [facility] [address] [place], [place] [address] [phone] Overall rating ? Much below average   [facility] [address] [place], [place] [address] [phone] Overall rating ? Much below average  [employer] [facility] [employer] [address] [place], [place] [address] [phone] Overall rating ??? Average  [facility] [address] [place], [place] [address] [phone] Overall rating ? Much below average  [facility] and [facility] [address] [place], [place] [address] [phone] Overall rating ? Much below average  [facility] [address] [place], [place] [address] [phone] Overall rating ???? Above average  [facility] [address] [place], [place] [address] [phone] Overall rating ????? Much above average  [facility] [address] [place], [place] [address] [phone] Overall rating ???? Above average  [facility] [address] [place], [place]  [address] [phone] Overall rating ????? Much above average  [facility] [address] [place], [place] [address] [phone] Overall rating ????  [facility] [address] [place], [place] [address] [phone] Overall rating ????? Much above average  [place] [facility] [address] [place], [place] [address] [phone] Overall rating ????? Much above average  [facility] [address] [place], [place] [address] [phone] Overall rating ? Much below average  [facility] [address] [place], [place] [address] [phone] Overall rating ??? Average  [facility] [address] [place], [place] [address] [phone] Overall rating ????? Much above average  [facility] [address] [place], [place] [address] [phone] Overall rating ??? Average  [facility] [address] [place], [place] [address] [phone] Overall rating ?? Below average  [facility] [address] [place], [place] [address] [phone] Overall rating ??? Average  [facility] [address] [place], [place] [address] [phone] Overall rating ????? Much above average  [facility] [address] [place], [place] [address] [phone] Overall rating ? Much below average  [facility]	[place] at the [facility] at [facility], [place] [address] [phone] Overall rating ????? Much above average  [facility] [address] [place], [place] [address] [phone] Overall rating ? Much below average  [facility] [address] [place], [place] [address] [phone] Overall rating ??? Average  [facility] and  [facility] [address] [place], [place] [address] [phone] Overall rating ??  Below average  [facility] [address] [place], [place] [address] [phone] Overall rating ? Much below average  [employer] [address] [place], [place] [address] [phone] Overall rating ??? Average  [facility] [address] [place], [place] [address] [phone] Overall rating ? Much below average  [facility] [address] [place], [place] [address] [phone] Overall rating ??? Average  [facility] [address] [place], [place] [address] [phone] Overall rating ? Much below average  [place] [address] [place], [place] [address] [phone] Overall rating ????? Much above average  [facility] [address] [place], [place] [address] [phone] Overall rating ? Much below average  [facility] [address] [place], [place] [address] [phone] Overall rating ???? Above average  [facility] [address] [place], [place] [address] [phone] Overall rating ????? Above average  [facility] and [facility] [address] [place], [place] [address] [phone] Overall rating ???? Above average  [facility] [address] [place], [place] [address] [phone] Overall rating ????? Much above average  [facility] [address] [place], [place] [address] [phone] Overall rating ? Much below average  [facility] [address] [place], [place] [address] [phone] Overall rating ????? Much above average  [facility] [address] [place], [place] [address] [phone] Overall rating ? Much below  average  [facility] [address] [place], [place] [address] [phone] Overall rating ????? Much above average  [facility] [address] [place], [place] [address] [phone] Overall rating ????? Much above average  [facility] [address] [place], [place] [address] [phone] Overall rating ??? Average  [facility] [address] [place], [place] [address] [phone] Overall rating ??? Average  [facility] [address] [place], [place] [address] [phone] Overall rating ???? Above average  [facility] [address] [place], [place] [address] [phone] Overall rating ?? Below average  [facility] [address] [place], [place] [address] [phone] Overall rating ???? Above average  [facility] and [facility] [address] [place], [place] [address] [phone] Overall rating ?? Below average  [name] [address] [place], [place] [address] [phone] Overall rating ??

## 2023-06-06 NOTE — Plan of Care (Signed)

## 2023-06-06 NOTE — Progress Notes (Signed)
Daily Progress Note   Patient Name: Donna Green       Date: 06/06/2023 DOB: 1940-12-28  Age: 82 y.o. MRN#: [id number] Attending Physician: Dorcas Carrow, MD Primary Care Physician: Renford Dills, MD Admit Date: 06/01/2023 Length of Stay: 3 days  Reason for Consultation/Follow-up: Establishing goals of care  HPI/Patient Profile:   82 y.o. 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 She was admitted on 06/01/2023 with new onset A-fib with RVR, acute metabolic encephalopathy, fevers, CKD stage IV, hypertensive crisis, and others.    PMT was consulted for GOC conversations.  Subjective:   Subjective: Chart Reviewed. Updates received. Patient Assessed. Created space and opportunity for patient  and family to explore thoughts and feelings regarding current medical situation.  Today's Discussion: Today saw the patient at the bedside, no family was present although her daughter did call during my visit and will be coming by later today.  The patient continued to appear more awake and alert.  She states she feels good.  We discussed the chaplain visit earlier today which she very much enjoyed.  We looked at the bedside tray with her food and she states that she did not really eat much because she was not hungry.  However, on the phone call with her daughter she asked her to bring some pimento cheese.  She also has a couple containers of snacks from home.  I encouraged her to continue to snack and eat to help with strength and stamina.  She states that they came in and offered PT and OT at home but she is not sure about this as she is going to discuss with her daughter.  Seems she has unsure about disposition desire.  Per TOC note it appears they are evaluating for  SNF placement as well.  Finally, we discussed why she is in the hospital.  I reminded her of her A-fib RVR resulting in a fall with hitting her head.  We also noted confusion likely contributed by use of IV narcotics which she is highly sensitive to.  I confirmed in the chart that this is now on her allergy list that she does not receive it in the future.  She states her back does hurt although Tylenol seems to help.  Encouraged her to ask for Tylenol if she is in pain.  I provided emotional and general support through therapeutic listening, empathy, sharing of stories, therapeutic touch, and other techniques. I answered all questions and addressed all concerns to the best of my ability.  Review of Systems  Constitutional:  Positive for fatigue.       "Feels good"  Respiratory:  Negative for shortness of breath.   Cardiovascular:  Negative for chest pain.  Gastrointestinal:  Negative for abdominal pain, nausea and vomiting.  Musculoskeletal:  Positive for back pain (Improved with Tylenol).  Neurological:  Positive for weakness.    Objective:   Vital Signs:  BP (!) 158/68 (BP Location: Left Arm)   Pulse 79   Temp 97.9 F (36.6 C)   Resp 17   Ht 5\' 5"  (1.651 m)   Wt 82.1 kg   SpO2 99%   BMI 30.12 kg/m   Physical Exam: Physical Exam Vitals and nursing note reviewed.  Constitutional:      General: She is not  in acute distress.    Appearance: She is ill-appearing. She is not toxic-appearing.  HENT:     Head: Normocephalic and atraumatic.  Cardiovascular:     Rate and Rhythm: Normal rate.  Pulmonary:     Effort: Pulmonary effort is normal. No respiratory distress.     Breath sounds: No wheezing or rhonchi.  Abdominal:     General: Abdomen is flat. Bowel sounds are normal. There is no distension.     Palpations: Abdomen is soft.     Tenderness: There is no abdominal tenderness.  Skin:    General: Skin is warm and dry.  Neurological:     General: No focal deficit present.      Mental Status: She is alert.  Psychiatric:        Mood and Affect: Mood normal.        Behavior: Behavior normal.     Palliative Assessment/Data: 50-60%    Existing Vynca/ACP Documentation: MOST form signed 03/22/2020  Assessment & Plan:   Impression: Present on Admission:  Hypertensive crisis  Type 2 diabetes mellitus with other circulatory complications (HCC)  HLD (hyperlipidemia)  Essential hypertension  Anxiety  Hypokalemia  Hypomagnesemia  Atrial fibrillation with RVR (HCC)  Closed compression fracture of L1 vertebra (HCC)  A-fib (HCC)  82 year old female with acute presentation chronic comorbidities as described above.  The patient appears significantly improved today, mental status clearing.  Likely multifactorial including IV narcotic use which she states she generally does not tolerate.  She appears to continue to make improvement, notable that she has transferred out of the ICU to the floor.  Patient's decision maker of choice would be her daughter Donna Green.  Daughter stated yesterday that she has a living will at home which she will try to bring in, although it is not on the chart.  However, her daughter is visiting later today.  At this point would like to remain full code and full scope. Overall prognosis guarded   SUMMARY OF RECOMMENDATIONS   Full code/full scope Continue to treat the treatable Daughter to bring in [employer] from home Appreciate chaplain visit and support Continued emotional and spiritual support of patient and family Palliative medicine will follow-up for progress Further GOC discussions as needed  Symptom Management:  Per primary team PMT is available to assist as needed  Code Status: Full code  Prognosis: Unable to determine  Discharge Planning: Home with Home Health  Discussed with: Patient, patient's family, medical team, nursing team  Thank you for allowing [place] to participate in the care of Donna Green PMT will continue to  support holistically.  Time Total: 40 min  Detailed review of medical records (labs, imaging, vital signs), medically appropriate exam, discussed with treatment team, counseling and education to patient, family, & staff, documenting clinical information, medication management, coordination of care  Wynne Dust, NP Palliative Medicine Team  Team Phone # [phone] (Nights/Weekends)  04/11/2021, 8:17 AM

## 2023-06-06 NOTE — Plan of Care (Signed)

## 2023-06-06 NOTE — TOC Progression Note (Signed)
Transition of Care [facility]) - Progression Note    Patient Details  Name: Donna Green MRN: [id number] Date of Birth: 1941-08-02  Transition of Care [facility]) CM/SW Contact  Otelia Santee, LCSW Phone Number: 06/06/2023, 11:19 AM  Clinical Narrative:    Met with pt to review bed offers for SNF. Pt shares she has been to SNF in the past and would like to speak to her daughter prior to agreeing to placement.   CSW spoke with pt's daughter, [name] via t/c who shares she will be coming to meet with pt at hospital after 4pm. She shares pt has been to SNF in the past and she does not currently have a preference for facility. She shares pt may need a lot of encouragement to go to SNF as she has some anxiety around not being at home. CSW emailed a list of bed offers w/ Medicare star ratings to [email or web address]. Pt's daughter to review facilities prior to making a bed choice.    Expected Discharge Plan: Skilled Nursing Facility Barriers to Discharge: Continued Medical Work up, Other (must enter comment) (Palliative discussion about goals of care.)  Expected Discharge Plan and Services In-house Referral: Clinical Social Work   Post Acute Care Choice: Skilled Nursing Facility Living arrangements for the past 2 months: Single Family Home                                       Social Determinants of Health (SDOH) Interventions SDOH Screenings   Food Insecurity: No Food Insecurity (06/03/2023)  Housing: Low Risk  (06/03/2023)  Transportation Needs: No Transportation Needs (06/03/2023)  Utilities: Not At Risk (06/03/2023)  Depression (PHQ2-9): Low Risk  (10/13/2022)  Tobacco Use: Low Risk  (06/01/2023)    Readmission Risk Interventions    03/26/2022    2:08 PM  Readmission Risk Prevention Plan  Transportation Screening Complete  PCP or Specialist Appt within 5-7 Days Complete  Home Care Screening Complete  Medication Review (RN CM) Complete

## 2023-06-06 NOTE — Progress Notes (Addendum)
Advanced Care Planning Note   Patient Name: Donna Green       Date: 06/06/2023 DOB: 07-01-1941  Age: 82 y.o. MRN#: [id number] Attending Physician: Dorcas Carrow, MD Primary Care Physician: Renford Dills, MD Admit Date: 06/01/2023 Length of Stay: 3 days  Advanced Care Planning Details:   Persons Present: ACP Persons Present: Patient and Patient's Daughter(s) Donna Anna) Present: Palliative Medicine Provider Wynne Dust, NP  All persons present were open and agreeable to conversation about advanced care planning. Education offered on the importance of documentation and the logistics of securing advanced directives.   We had in-depth discussion on advance care planning and decision makers.  She states that if she could not communicate for herself she would want her daughter Donna Green to be her Management consultant.  We discussed that we could complete HCPOA documents, however given that this is the legal order of things that it is not necessary at this moment.  We also discussed CODE STATUS and other HCPOA documents.  Daughter states that she does have a living will signed and notarized at home, which she will try to bring tomorrow.  I encouraged her to do so so we can have it scanned and entered into our system for future reference.  Education was offered on the following:   Medical treatment options available, Possible risks and benefits of each option/intervention, Available ACP documents able to be completed, and Importance/Benefit of completing ACP documents  Forms completed include:  Other None, daughter to bring living will in from home for scanning. These forms can be found in the ACP link in the area under the patient's picture  After conversation, patient and/or family determined the desired discharge disposition at this time would be: Home with Home Health   Summary of ACP Decisions:   Remain full code, full scope Daughter to bring in LivingWell documentation Daughter  Donna Green is patient's decision-maker of choice     Thank you for allowing [place] to participate in the care of [name] PMT will continue to support holistically.  Total ACP Time: 20 min  The time above is specific to time spend on advanced care planning. It does not include any separately billed services.  Signed by: Wynne Dust, DNP, AGNP-C Palliative Medicine Team  Team Phone # [phone] (Nights/Weekends)  06/06/2023, 9:20 AM

## 2023-06-06 NOTE — Progress Notes (Signed)
Physical Therapy Treatment Patient Details Name: Donna Green MRN: [id number] DOB: 09-18-1940 Today's Date: 06/06/2023   History of Present Illness Patient is 82 y.o. female admitted on 06/02/2023 for the evaluation of afib after sustaining a fall from home. CT of head and C-spine negative for acute injury, CT of abdomen/pelvis without contrast showing a mild L1 superior endplate central compression fx w/ slight posterior bowing of the dorsal vertebral cortex but no significant anterior posterior height loss. PMH significant for DM, HTN, HLD, CVA x 2 (2007, 2011) w/ residual Rt hemiparesis, CKD IV    PT Comments  General Comments: AxO x 2 internittent pleasant following commands.  Repeated questions.  Poor historian.  HOH.  Assisted OOB was difficult.  General bed mobility comments: pt was able to initiate B LE partially off bed then required Max Assist using ed pad to complete scooting to EOB.  Pt presented with increased back with sitting.  Attempted to DON brace however Brace DOES NOT corretly fit and was removed.  Current TLSO is NOT suitable for a L1 Comp Fx. Stand pivot transfers: Total assist, +2 physical assistance, +2 safety/equipment, From elevated surface.  Non amb as pt was unable to support herself due to weakness. LPT has recd SNF.    If plan is discharge home, recommend the following: Two people to help with walking and/or transfers;Assistance with cooking/housework;Assist for transportation;Help with stairs or ramp for entrance;Two people to help with bathing/dressing/bathroom   Can travel by private vehicle     No  Equipment Recommendations  None recommended by PT    Recommendations for Other Services       Precautions / Restrictions Precautions Precautions: Fall Precaution Comments: back, has an order for a TLSO however DOES NOT FIT correctly.  T bar is too close to Pt's throat and brace "rides u7p" when pt is seated.  Applied then removed. Restrictions Weight Bearing  Restrictions: No     Mobility  Bed Mobility Overal bed mobility: Needs Assistance Bed Mobility: Supine to Sit     Supine to sit: Max assist, +2 for physical assistance, +2 for safety/equipment, HOB elevated     General bed mobility comments: pt was able to initiate B LE partially off bed then required Max Assist using ed pad to complete scooting to EOB.  Pt presented with increased back with sitting.  Attempted to DON brace however Brace DOES NOT corretly fit and was removed.  Current TLSO is NOT suitable for a L1 Comp Fx.    Transfers Overall transfer level: Needs assistance Equipment used: Rolling walker (2 wheels), None Transfers: Bed to chair/wheelchair/BSC   Stand pivot transfers: Total assist, +2 physical assistance, +2 safety/equipment, From elevated surface         General transfer comment: Pt required Total Assist to transfer from elevated bed to [facility] partial pivot then again from [facility] to recliner.  Pt < 15% self able.  Weak.    Ambulation/Gait               General Gait Details: transfer only   Stairs             Wheelchair Mobility     Tilt Bed    Modified Rankin (Stroke Patients Only)       Balance  Cognition Arousal: Alert Behavior During Therapy: Flat affect Overall Cognitive Status: No family/caregiver present to determine baseline cognitive functioning Area of Impairment: Orientation, Attention, Memory, Safety/judgement, Problem solving                               General Comments: AxO x 2 internittent pleasant following commands.  Repeated questions.  Poor historian.  HOH.        Exercises      General Comments        Pertinent Vitals/Pain Pain Assessment Pain Assessment: Faces Faces Pain Scale: Hurts whole lot Pain Location: back Pain Descriptors / Indicators: Discomfort, Grimacing, Guarding Pain Intervention(s): Monitored during session,  Premedicated before session, Repositioned    Home Living                          Prior Function            PT Goals (current goals can now be found in the care plan section) Progress towards PT goals: Progressing toward goals    Frequency    Min 1X/week      PT Plan      Co-evaluation              AM-PAC PT "6 Clicks" Mobility   Outcome Measure  Help needed turning from your back to your side while in a flat bed without using bedrails?: Total Help needed moving from lying on your back to sitting on the side of a flat bed without using bedrails?: Total Help needed moving to and from a bed to a chair (including a wheelchair)?: Total Help needed standing up from a chair using your arms (e.g., wheelchair or bedside chair)?: Total Help needed to walk in hospital room?: Total Help needed climbing 3-5 steps with a railing? : Total 6 Click Score: 6    End of Session Equipment Utilized During Treatment: Gait belt Activity Tolerance: Patient limited by fatigue;Patient limited by pain;Treatment limited secondary to medical complications (Comment) Patient left: in chair;with call bell/phone within reach;with chair alarm set;with nursing/sitter in room Nurse Communication: Mobility status PT Visit Diagnosis: Muscle weakness (generalized) (M62.81);History of falling (Z91.81);Difficulty in walking, not elsewhere classified (R26.2)     Time: 3875-6433 PT Time Calculation (min) (ACUTE ONLY): 24 min  Charges:    $Therapeutic Activity: 23-37 mins PT General Charges $$ ACUTE PT VISIT: 1 Visit                     Felecia Shelling  PTA Acute  Rehabilitation Services Office M-F          [phone]

## 2023-06-06 NOTE — Plan of Care (Signed)
  Problem: Education: Goal: Knowledge of General Education information will improve Description: Including pain rating scale, medication(s)/side effects and non-pharmacologic comfort measures Outcome: Progressing   Problem: Clinical Measurements: Goal: Will remain free from infection Outcome: Progressing Goal: Cardiovascular complication will be avoided Outcome: Progressing   Problem: Nutrition: Goal: Adequate nutrition will be maintained Outcome: Progressing   Problem: Coping: Goal: Level of anxiety will decrease Outcome: Progressing   Problem: Elimination: Goal: Will not experience complications related to bowel motility Outcome: Progressing   Problem: Safety: Goal: Ability to remain free from injury will improve Outcome: Progressing   Problem: Skin Integrity: Goal: Risk for impaired skin integrity will decrease Outcome: Progressing

## 2023-06-06 NOTE — Progress Notes (Signed)
Chaplain engaged in an initial visit with Kannon. Chaplain provided reflective listening and support as Loreda shared about her healthcare journey. She shared about hurting her back and the different issues that have come with that. She expressed that she must have had a reaction to medication that has left her so confused and frightened. Waking up in the hospital has been hard for Ismael. She expressed her desire to get home. Haadiya voiced that she is normally really independent and that she often assists with her grandchildren, still travels, and is still active. Roselie detailed what it was like waking up in the [facility] emergency department not knowing why she was there.  Chaplain tried to affirm the medical team's intention of providing her help and support. Chaplain worked to provided comforting and compassionate presence. Nehemie also shared about having diabetes and high blood pressure. She works to be mindful of what she eats but she enjoys sweets.   Chaplain assisted Donyelle in getting her food tray situated to eat.     06/06/23 1100  Spiritual Encounters  Type of Visit Initial  Care provided to: Patient  Reason for visit Routine spiritual support

## 2023-06-06 NOTE — Progress Notes (Signed)
PROGRESS NOTE    Donna Green  LKG:[id number] DOB: 1941/06/17 DOA: 06/01/2023 PCP: Renford Dills, MD   Brief Narrative:  82 y.o. 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  On presentation, her blood pressure was elevated to 209/108 and she was found to be in new onset A-fib with RVR.  Troponins were mildly elevated at 100, then 104, then 105, then 92.  Chest x-ray showed no acute cardiopulmonary process but showed a large hiatal hernia.  CT of the head with no acute intracranial abnormality.  CT cervical spine showed no acute fractures.  CT abdomen/pelvis showed mild L1 superior endplate central compression fracture with slight posterior bowing of the dorsal vertebral cortex but no significant anterior posterior height loss; large hiatal hernia with inverted intrathoracic stomach; diverticulosis without diverticulitis.  Left knee x-ray showed no fracture or effusion.  Left femur x-ray with no acute findings.  Cardiology consulted.  Palliative care consulted for goals of care discussion.  Assessment & Plan:   New onset paroxysmal A-fib with RVR -Presented with a brief rapid A-fib.  Seen by cardiology.  Patient remains in sinus rhythm now.  Echocardiogram with normal ejection fraction.  Currently rate controlled on carvedilol, started on Eliquis and tolerating.    Elevated troponins, type II myocardial injury. -Troponins did not trend up significantly.  No chest pain.  Cardiology following  Acute metabolic encephalopathy -Patient presented with fall and trauma to head but no loss of consciousness.  CT of the head was negative for acute intracranial abnormality. -Unclear if the patient has concussion or related to opiates. -Has had intermittent  confusion and restlessness: Dilaudid discontinued.  Patient had issues with prolonged confusion in the past after IV narcotics.  Will avoid narcotics.   -Appropriately improving.  Recommend to continue PT OT.  Delirium precautions. Ammonia is normal.  B12 is 196 and borderline low.  Getting B12 injections while in the hospital and recommend oral B12 supplementation on discharge.  Fever -Presented with low-grade fever.  Currently afebrile.  Less likely infection. Blood cultures negative so far.  Urine is essentially normal.  Chest x-ray is normal. Monitor off antibiotics at this time.  Close compression fracture of L1 vertebra -Continue thoracolumbar corset.  Fall precautions.  PT evaluation.  Pain management.  Large hiatal hernia -Continue PPI.  Outpatient follow-up with general surgery  History of stroke with residual right-sided deficit Hyperlipidemia -Supportive care.  Outpatient follow-up with neurology.  Continue dipyridamole-aspirin and statin  CKD stage IV -Baseline creatinine around 1.7-2.2.  Creatinine slightly elevated today.  Will monitor.  Monitor output.  Hypertensive crisis in a patient with essential hypertension Blood pressures fluctuate.  Started on carvedilol for rate control.  Will discontinue amlodipine.  Hypokalemia/hypomagnesemia. -Improved  Macrocytic anemia -Questionable cause.  Hemoglobin stable.  Supplementing B12 and folic acid.  Anxiety -On as needed Ativan.  Diabetes mellitus type 2 with hyperglycemia -A1c 7.7.  Continue CBGs with SSI.  Continue long-acting insulin  Goals of care -Palliative care following.  Remains full code.  DVT prophylaxis: Lovenox Code Status: Full Family Communication: None at the bedside.  Unable to connect to her daughter [name]. Disposition Plan: Skilled nursing facility. Status is: inpatient because: Clinically improved.  Will need skilled nursing facility for rehab.  Stable for transfer.    Consultants:  Cardiology.  Palliative care  Procedures: Echo  Antimicrobials: None   Subjective:  Patient seen and examined.  Today she is more alert awake and interactive.  She tells me she remembers having conversation with me.  Denies any complaints today.  Objective: Vitals:   06/05/23 1603 06/05/23 2000 06/06/23 0449 06/06/23 0450  BP: (!) 143/67 (!) 151/71 (!) 164/65 (!) 153/61  Pulse: 80 80 82   Resp: 18 18 18    Temp: 99.4 F (37.4 C) 98.4 F (36.9 C) 98.7 F (37.1 C)   TempSrc: Oral Oral Oral   SpO2: 97% 99% 97%   Weight:    82.1 kg  Height:        Intake/Output Summary (Last 24 hours) at 06/06/2023 1112 Last data filed at 06/06/2023 0700 Gross per 24 hour  Intake 240 ml  Output 1000 ml  Net -760 ml   Filed Weights   06/04/23 0500 06/05/23 0700 06/06/23 0450  Weight: 76.8 kg 78.2 kg 82.1 kg    Examination:  General: Looks fairly comfortable.  Brushing teeth by herself.  Interactive today. Patient is alert and awake.  She is mostly oriented with some delayed cognition. Flat affect.  She has some weakness on the right side. Cardiovascular: S1-S2 normal.  Regular rate rhythm. Respiratory: Bilateral clear.  Poor inspiratory effort. Gastrointestinal: Soft.  Nontender.  Bowel sound present. Ext: No swelling or edema.  No cyanosis. Musculoskeletal: No deformities.      Data Reviewed: I have personally reviewed following labs and imaging studies  CBC: Recent Labs  Lab 06/02/23 0132 06/02/23 0519 06/03/23 0757 06/04/23 0301  WBC 8.7 7.5 7.8 6.6  NEUTROABS 6.3 4.4 6.4 4.1  HGB 12.8 11.6* 11.3* 10.5*  HCT 39.1 35.0* 35.9* 33.7*  MCV 101.8* 101.7* 107.2* 106.3*  PLT 237 212 162 177   Basic Metabolic Panel: Recent Labs  Lab 06/02/23 0132 06/02/23 0519 06/03/23 0757 06/04/23 0301 06/05/23 0254  NA 141 140 137 138 136  K 3.9 3.3* 3.7 3.5 4.1  CL 104 107 109 110 110  CO2 26 21* 18* 20* 20*  GLUCOSE 218* 216* 204* 201* 170*  BUN 29* 29* 31* 35* 36*   CREATININE 2.09* 1.98* 2.09* 2.16* 2.56*  CALCIUM 8.9 8.3* 7.9* 7.8* 7.8*  MG  --  1.1* 1.7 1.6* 2.1   GFR: Estimated Creatinine Clearance: 17.6 mL/min (A) (by C-G formula based on SCr of 2.56 mg/dL (H)). Liver Function Tests: Recent Labs  Lab 06/02/23 0519 06/04/23 0301  AST 19 29  ALT 11 15  ALKPHOS 42 40  BILITOT 0.4 0.7  PROT 6.4* 6.3*  ALBUMIN 3.3* 2.9*   No results for input(s): "LIPASE", "AMYLASE" in the last 168 hours. Recent Labs  Lab 06/04/23 0301  AMMONIA 28   Coagulation Profile: No results for input(s): "INR", "PROTIME" in the last 168 hours. Cardiac Enzymes: No results for input(s): "CKTOTAL", "CKMB", "CKMBINDEX", "TROPONINI" in the last 168 hours. BNP (last 3 results) No results for input(s): "PROBNP" in the last 8760 hours. HbA1C: No results for input(s): "HGBA1C" in the last 72 hours.  CBG: Recent Labs  Lab 06/05/23 1122 06/05/23 1603 06/05/23 1711 06/05/23 2028 06/06/23 0723  GLUCAP 228* 154* 159* 179* 149*   Lipid Profile: No results for input(s): "CHOL", "HDL", "LDLCALC", "TRIG", "CHOLHDL", "LDLDIRECT" in the last 72 hours. Thyroid Function Tests: Recent Labs    06/04/23 0301  TSH 0.791   Anemia Panel: Recent Labs    06/04/23 0301  VITAMINB12 196   Sepsis Labs: No results  for input(s): "PROCALCITON", "LATICACIDVEN" in the last 168 hours.  Recent Results (from the past 240 hour(s))  MRSA Next Gen by PCR, Nasal     Status: None   Collection Time: 06/02/23  8:54 AM   Specimen: Nasal Mucosa; Nasal Swab  Result Value Ref Range Status   MRSA by PCR Next Gen NOT DETECTED NOT DETECTED Final    Comment: (NOTE) The GeneXpert MRSA Assay (FDA approved for NASAL specimens only), is one component of a comprehensive MRSA colonization surveillance program. It is not intended to diagnose MRSA infection nor to guide or monitor treatment for MRSA infections. Test performance is not FDA approved in patients less than 63 years old. Performed at  [facility], [address] [place], [place] [address]   Culture, blood (Routine X 2) w Reflex to ID Panel     Status: None (Preliminary result)   Collection Time: 06/04/23  8:20 AM   Specimen: BLOOD  Result Value Ref Range Status   Specimen Description   Final    BLOOD BLOOD LEFT ARM ANAEROBIC BOTTLE ONLY Performed at [facility], [address], [place], [place] [address]    Special Requests   Final    BOTTLES DRAWN AEROBIC AND ANAEROBIC Blood Culture adequate volume Performed at [facility], [address], [place], [place] [address]    Culture   Final    NO GROWTH 2 DAYS Performed at [facility], [address], [place], [place] [address]    Report Status PENDING  Incomplete  Culture, blood (Routine X 2) w Reflex to ID Panel     Status: None (Preliminary result)   Collection Time: 06/04/23  8:20 AM   Specimen: BLOOD  Result Value Ref Range Status   Specimen Description   Final    BLOOD BLOOD LEFT HAND ANAEROBIC BOTTLE ONLY Performed at [facility], [address], [place], [place] [address]    Special Requests   Final    BOTTLES DRAWN AEROBIC AND ANAEROBIC Blood Culture adequate volume Performed at [facility], [address], [place], [place] [address]    Culture   Final    NO GROWTH 2 DAYS Performed at [facility], [address] [place], [place] [address]    Report Status PENDING  Incomplete  Urine Culture (for pregnant, neutropenic or urologic patients or patients with an indwelling urinary catheter)     Status: Abnormal (Preliminary result)   Collection Time: 06/04/23 12:44 PM   Specimen: Urine, Clean Catch  Result Value Ref Range Status   Specimen Description   Final    URINE, CLEAN CATCH Performed at [facility], [address], [place], [place] [address]    Special Requests   Final    NONE Performed at [facility], [address] [place], [place] [address]    Culture (A)  Final    >=100,000 COLONIES/mL KLEBSIELLA AEROGENES CULTURE REINCUBATED FOR BETTER GROWTH >=100,000 COLONIES/mL DIPHTHEROIDS(CORYNEBACTERIUM SPECIES) Standardized susceptibility testing for this organism is not available. Performed at [facility], [address] [place], [place] [address]    Report Status PENDING  Incomplete         Radiology Studies: No results found.      Scheduled Meds:  apixaban  2.5 mg Oral BID   carvedilol  12.5 mg Oral BID WC   cyanocobalamin  1,000 mcg Intramuscular Q0600   feeding supplement  237 mL Oral BID BM   folic acid  1 mg Oral Daily   insulin aspart  0-9 Units Subcutaneous TID WC   insulin glargine-yfgn  14 Units Subcutaneous QHS   pantoprazole  40 mg Oral Daily   rosuvastatin  10 mg Oral Daily   Continuous Infusions:        Dorcas Carrow, MD [facility] 06/06/2023, 11:12 AM

## 2023-06-07 DIAGNOSIS — W19XXXA Unspecified fall, initial encounter: Secondary | ICD-10-CM | POA: Diagnosis not present

## 2023-06-07 DIAGNOSIS — I169 Hypertensive crisis, unspecified: Secondary | ICD-10-CM | POA: Diagnosis not present

## 2023-06-07 DIAGNOSIS — Z515 Encounter for palliative care: Secondary | ICD-10-CM | POA: Diagnosis not present

## 2023-06-07 DIAGNOSIS — Z7189 Other specified counseling: Secondary | ICD-10-CM | POA: Diagnosis not present

## 2023-06-07 LAB — MULTIPLE MYELOMA PANEL, SERUM
Albumin SerPl Elph-Mcnc: 3.3 g/dL (ref 2.9–4.4)
Albumin/Glob SerPl: 1.2 (ref 0.7–1.7)
Alpha 1: 0.3 g/dL (ref 0.0–0.4)
Alpha2 Glob SerPl Elph-Mcnc: 0.6 g/dL (ref 0.4–1.0)
B-Globulin SerPl Elph-Mcnc: 1 g/dL (ref 0.7–1.3)
Gamma Glob SerPl Elph-Mcnc: 1 g/dL (ref 0.4–1.8)
Globulin, Total: 2.8 g/dL (ref 2.2–3.9)
IgA: 431 mg/dL — ABNORMAL HIGH (ref 64–422)
IgG (Immunoglobin G), Serum: 1132 mg/dL (ref 586–1602)
IgM (Immunoglobulin M), Srm: 85 mg/dL (ref 26–217)
Total Protein ELP: 6.1 g/dL (ref 6.0–8.5)

## 2023-06-07 LAB — GLUCOSE, CAPILLARY
Glucose-Capillary: 146 mg/dL — ABNORMAL HIGH (ref 70–99)
Glucose-Capillary: 189 mg/dL — ABNORMAL HIGH (ref 70–99)
Glucose-Capillary: 148 mg/dL — ABNORMAL HIGH (ref 70–99)
Glucose-Capillary: 159 mg/dL — ABNORMAL HIGH (ref 70–99)

## 2023-06-07 LAB — BASIC METABOLIC PANEL
Anion gap: 10 (ref 5–15)
BUN: 37 mg/dL — ABNORMAL HIGH (ref 8–23)
CO2: 19 mmol/L — ABNORMAL LOW (ref 22–32)
Calcium: 9.1 mg/dL (ref 8.9–10.3)
Chloride: 109 mmol/L (ref 98–111)
Creatinine, Ser: 2.36 mg/dL — ABNORMAL HIGH (ref 0.44–1.00)
GFR, Estimated: 20 mL/min — ABNORMAL LOW (ref >60)
Glucose, Bld: 152 mg/dL — ABNORMAL HIGH (ref 70–99)
Potassium: 4 mmol/L (ref 3.5–5.1)
Sodium: 138 mmol/L (ref 135–145)

## 2023-06-07 MED ORDER — HALOPERIDOL LACTATE 5 MG/ML IJ SOLN
2.0000 mg | Freq: Once | INTRAMUSCULAR | Status: AC
  Administered 2023-06-07: 2 mg via INTRAVENOUS
  Filled 2023-06-07: qty 1

## 2023-06-07 MED ORDER — QUETIAPINE FUMARATE 25 MG PO TABS
12.5000 mg | ORAL_TABLET | Freq: Every morning | ORAL | Status: DC
  Administered 2023-06-07 – 2023-06-09 (×3): 12.5 mg via ORAL
  Filled 2023-06-07 (×3): qty 1

## 2023-06-07 MED ORDER — QUETIAPINE FUMARATE 25 MG PO TABS
25.0000 mg | ORAL_TABLET | Freq: Every day | ORAL | Status: DC
  Administered 2023-06-07 – 2023-06-09 (×3): 25 mg via ORAL
  Filled 2023-06-07 (×3): qty 1

## 2023-06-07 NOTE — Progress Notes (Signed)
PROGRESS NOTE    Donna Green  BJY:[id number] DOB: May 20, 1941 DOA: 06/01/2023 PCP: Renford Dills, MD   Brief Narrative:  82 y.o. 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  On presentation, her blood pressure was elevated to 209/108 and she was found to be in new onset A-fib with RVR.  Troponins were mildly elevated at 100, then 104, then 105, then 92.  Chest x-ray showed no acute cardiopulmonary process but showed a large hiatal hernia.  CT of the head with no acute intracranial abnormality.  CT cervical spine showed no acute fractures.  CT abdomen/pelvis showed mild L1 superior endplate central compression fracture with slight posterior bowing of the dorsal vertebral cortex but no significant anterior posterior height loss; large hiatal hernia with inverted intrathoracic stomach; diverticulosis without diverticulitis.  Left knee x-ray showed no fracture or effusion.  Left femur x-ray with no acute findings.  Cardiology consulted.  Palliative care consulted for goals of care discussion. Hospital course complicated by delirium and unable to walk.  Assessment & Plan:   New onset paroxysmal A-fib with RVR -Presented with a brief rapid A-fib.  Seen by cardiology.  Patient remains in sinus rhythm now.  Echocardiogram with normal ejection fraction. Currently rate controlled on carvedilol, started on Eliquis and tolerating.    Elevated troponins, type II myocardial injury. -Troponins did not trend up significantly.  No chest pain.  Cardiology following  Acute metabolic encephalopathy/hospital-acquired delirium. -Patient presented with fall and trauma to head but no loss of consciousness.  CT of the head was negative for acute intracranial abnormality. -Unclear  if the patient has concussion or related to opiates. -Has had intermittent confusion and restlessness: Dilaudid discontinued.  Patient had issues with prolonged confusion in the past after IV narcotics.  Will avoid narcotics.   -Recommend to continue PT OT.  Delirium precautions. Ammonia is normal.  B12 is 196 and borderline low.  Getting B12 injections while in the hospital and recommend oral B12 supplementation on discharge. -Persistent delirium, will start on Seroquel 12.5 mg in the morning, 25 mg at night.  Fever.  Infection ruled out. -Presented with low-grade fever.  Currently afebrile.  Less likely infection. Blood cultures negative so far.  Urine is essentially normal.  Chest x-ray is normal. Monitor off antibiotics at this time.  Close compression fracture of L1 vertebra -Continue thoracolumbar corset.  Fall precautions.  PT evaluation.  Pain management.  Large hiatal hernia -Continue PPI.  Outpatient follow-up with general surgery  History of stroke with residual right-sided deficit Hyperlipidemia -Supportive care.  Outpatient follow-up with neurology.  Continue dipyridamole-aspirin and statin  CKD stage IV -Baseline creatinine around 1.7-2.2.  Creatinine slightly elevated today.  Will monitor.  Monitor output.  Hypertensive crisis in a patient with essential hypertension Blood pressures fluctuate.  Started on carvedilol for rate control.  Will discontinue amlodipine.  Hypokalemia/hypomagnesemia. -Improved  Macrocytic anemia -Questionable cause.  Hemoglobin stable.  Supplementing B12 and folic acid.  Anxiety -On as needed Ativan.  Diabetes mellitus type 2 with hyperglycemia -A1c 7.7.  Continue CBGs with SSI.  Continue long-acting insulin  Goals of care -Palliative care following.  Remains full code.  Called and discussed with patient's daughter.  Patient likely will have worsening delirium going to a nursing home.  Would like patient to improve mobility  in the  hospital and go home with home health PT OT if she is able to move around with one-person assist.  Her daughter is at home all the time.  DVT prophylaxis: Lovenox Code Status: Full Family Communication: Patient's daughter Ms. Judeth Cornfield on the phone. Disposition Plan: Skilled nursing facility. Status is: inpatient because: Significant delirium.    Consultants: Cardiology.  Palliative care  Procedures: Echo  Antimicrobials: None   Subjective:  Patient seen and examined.  Overnight events needed.  Patient became very impulsive and agitated overnight.  She was given 2 doses of haloperidol and her usual dose of Ativan.  Today morning patient tells me she does not know why she is here.  Patient tells me that people are bothering her all the time and she does not want to see anybody other than her daughter.  Denies any pain.  Objective: Vitals:   06/07/23 0320 06/07/23 0442 06/07/23 0623 06/07/23 1157  BP:  (!) 188/64 (!) 170/87 (!) 186/82  Pulse:  96 (!) 101 98  Resp:  18  16  Temp:  98.7 F (37.1 C)  98.2 F (36.8 C)  TempSrc:  Oral    SpO2:  98%  96%  Weight: 79.9 kg     Height:        Intake/Output Summary (Last 24 hours) at 06/07/2023 1443 Last data filed at 06/07/2023 1248 Gross per 24 hour  Intake 238 ml  Output 900 ml  Net -662 ml   Filed Weights   06/05/23 0700 06/06/23 0450 06/07/23 0320  Weight: 78.2 kg 82.1 kg 79.9 kg    Examination:  General: Looks anxious.  Impulsive.   Patient is alert and awake.  Oriented to herself only.  Impulsive.  Flat affect.  She has some weakness on the right side. Cardiovascular: S1-S2 normal.  Regular rate rhythm. Respiratory: Bilateral clear.  Poor inspiratory effort. Gastrointestinal: Soft.  Nontender.  Bowel sound present. Ext: No swelling or edema.  No cyanosis. Musculoskeletal: No deformities.      Data Reviewed: I have personally reviewed following labs and imaging studies  CBC: Recent Labs  Lab 06/02/23 0132  06/02/23 0519 06/03/23 0757 06/04/23 0301  WBC 8.7 7.5 7.8 6.6  NEUTROABS 6.3 4.4 6.4 4.1  HGB 12.8 11.6* 11.3* 10.5*  HCT 39.1 35.0* 35.9* 33.7*  MCV 101.8* 101.7* 107.2* 106.3*  PLT 237 212 162 177   Basic Metabolic Panel: Recent Labs  Lab 06/02/23 0519 06/03/23 0757 06/04/23 0301 06/05/23 0254 06/07/23 0806  NA 140 137 138 136 138  K 3.3* 3.7 3.5 4.1 4.0  CL 107 109 110 110 109  CO2 21* 18* 20* 20* 19*  GLUCOSE 216* 204* 201* 170* 152*  BUN 29* 31* 35* 36* 37*  CREATININE 1.98* 2.09* 2.16* 2.56* 2.36*  CALCIUM 8.3* 7.9* 7.8* 7.8* 9.1  MG 1.1* 1.7 1.6* 2.1  --    GFR: Estimated Creatinine Clearance: 18.9 mL/min (A) (by C-G formula based on SCr of 2.36 mg/dL (H)). Liver Function Tests: Recent Labs  Lab 06/02/23 0519 06/04/23 0301  AST 19 29  ALT 11 15  ALKPHOS 42 40  BILITOT 0.4 0.7  PROT 6.4* 6.3*  ALBUMIN 3.3* 2.9*   No results for input(s): "LIPASE", "AMYLASE" in the last 168 hours. Recent Labs  Lab 06/04/23 0301  AMMONIA 28   Coagulation Profile: No results for input(s): "INR", "PROTIME" in the last 168 hours. Cardiac Enzymes: No results for input(s): "CKTOTAL", "CKMB", "CKMBINDEX", "TROPONINI" in the last 168 hours. BNP (  last 3 results) No results for input(s): "PROBNP" in the last 8760 hours. HbA1C: No results for input(s): "HGBA1C" in the last 72 hours.  CBG: Recent Labs  Lab 06/06/23 1127 06/06/23 1702 06/06/23 2023 06/07/23 0753 06/07/23 1158  GLUCAP 173* 254* 217* 146* 189*   Lipid Profile: No results for input(s): "CHOL", "HDL", "LDLCALC", "TRIG", "CHOLHDL", "LDLDIRECT" in the last 72 hours. Thyroid Function Tests: No results for input(s): "TSH", "T4TOTAL", "FREET4", "T3FREE", "THYROIDAB" in the last 72 hours.  Anemia Panel: No results for input(s): "VITAMINB12", "FOLATE", "FERRITIN", "TIBC", "IRON", "RETICCTPCT" in the last 72 hours.  Sepsis Labs: No results for input(s): "PROCALCITON", "LATICACIDVEN" in the last 168  hours.  Recent Results (from the past 240 hour(s))  MRSA Next Gen by PCR, Nasal     Status: None   Collection Time: 06/02/23  8:54 AM   Specimen: Nasal Mucosa; Nasal Swab  Result Value Ref Range Status   MRSA by PCR Next Gen NOT DETECTED NOT DETECTED Final    Comment: (NOTE) The GeneXpert MRSA Assay (FDA approved for NASAL specimens only), is one component of a comprehensive MRSA colonization surveillance program. It is not intended to diagnose MRSA infection nor to guide or monitor treatment for MRSA infections. Test performance is not FDA approved in patients less than 22 years old. Performed at [facility], [address] [place], [place] [address]   Culture, blood (Routine X 2) w Reflex to ID Panel     Status: None (Preliminary result)   Collection Time: 06/04/23  8:20 AM   Specimen: BLOOD  Result Value Ref Range Status   Specimen Description   Final    BLOOD BLOOD LEFT ARM ANAEROBIC BOTTLE ONLY Performed at [facility], [address], [place], [place] [address]    Special Requests   Final    BOTTLES DRAWN AEROBIC AND ANAEROBIC Blood Culture adequate volume Performed at [facility], [address], [place], [place] [address]    Culture   Final    NO GROWTH 3 DAYS Performed at [facility], [address], [place], [place] [address]    Report Status PENDING  Incomplete  Culture, blood (Routine X 2) w Reflex to ID Panel     Status: None (Preliminary result)   Collection Time: 06/04/23  8:20 AM   Specimen: BLOOD  Result Value Ref Range Status   Specimen Description   Final    BLOOD BLOOD LEFT HAND ANAEROBIC BOTTLE ONLY Performed at [facility], [address], [place], [place] [address]    Special Requests   Final    BOTTLES DRAWN AEROBIC AND ANAEROBIC Blood Culture adequate volume Performed at [facility], [address] [place], [place] [address]    Culture    Final    NO GROWTH 3 DAYS Performed at [facility], [address], [place], [place] [address]    Report Status PENDING  Incomplete  Urine Culture (for pregnant, neutropenic or urologic patients or patients with an indwelling urinary catheter)     Status: Abnormal (Preliminary result)   Collection Time: 06/04/23 12:44 PM   Specimen: Urine, Clean Catch  Result Value Ref Range Status   Specimen Description   Final    URINE, CLEAN CATCH Performed at [facility], [address] [place], [place] [address]    Special Requests   Final    NONE Performed at [facility], [address] [place], [place] [address]    Culture (A)  Final    >=100,000 COLONIES/mL KLEBSIELLA AEROGENES CONFIRMATION OF SUSCEPTIBILITIES IN PROGRESS >=100,000 COLONIES/mL DIPHTHEROIDS(CORYNEBACTERIUM SPECIES) Standardized susceptibility testing for this organism is not available. Performed at [facility], [address] [place], [place] [address]    Report Status PENDING  Incomplete         Radiology Studies: No results found.      Scheduled Meds:  apixaban  2.5 mg Oral BID   carvedilol  12.5 mg Oral BID WC   cyanocobalamin  1,000 mcg Intramuscular Q0600   feeding supplement  237 mL Oral BID BM   folic acid  1 mg Oral Daily   insulin aspart  0-9 Units Subcutaneous TID WC   insulin glargine-yfgn  14 Units Subcutaneous QHS   pantoprazole  40 mg Oral Daily   QUEtiapine  12.5 mg Oral q morning   QUEtiapine  25 mg Oral QHS   rosuvastatin  10 mg Oral Daily   Continuous Infusions:        Dorcas Carrow, MD [facility] 06/07/2023, 2:43 PM

## 2023-06-07 NOTE — Progress Notes (Signed)
Daily Progress Note   Patient Name: Donna Green       Date: 06/07/2023 DOB: [birth year]  Age: 82 y.o. MRN#: [id number] Attending Physician: Donna Carrow, MD Primary Care Physician: Donna Dills, MD Admit Date: 06/01/2023 Length of Stay: 4 days  Reason for Consultation/Follow-up: Establishing goals of care  HPI/Patient Profile:   82 y.o. 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 She was admitted on 06/01/2023 with new onset A-fib with RVR, acute metabolic encephalopathy, fevers, CKD stage IV, hypertensive crisis, and others.    PMT was consulted for GOC conversations.  Subjective:   Subjective: Chart Reviewed. Updates received. Patient Assessed. Created space and opportunity for patient  and family to explore thoughts and feelings regarding current medical situation.  Today's Discussion: Today saw the patient at the bedside, no family was present.  The patient easily awakened to voice and when she saw her daughter was not at the bedside she became quite distressed.  She states that she is worried where her daughter is.  I used the phone I called their daughter and explained that her mom was worried about her and provided the phone with the patient.  This seemed to calm her down.   Otherwise the patient denies pain, nausea, vomiting.  The patient continued to appear more awake and alert.  She states she feels good.  She states she ate a little bit of the fish from lunch.  She seems to want to go home.  Per previous discussions with daughter the patient is a full code and full scope, based on discussions patient and daughter had previously.  The patient's daughter wants to continue to honor her mother's wishes, which is always encouraged.  This is  supported by MOST form in the ACP tab of epic which essentially states a full code and full scope.  I provided emotional and general support through therapeutic listening, empathy, sharing of stories, therapeutic touch, and other techniques. I answered all questions and addressed all concerns to the best of my ability.  Review of Systems  Constitutional:  Positive for fatigue.       "Feels good"  Respiratory:  Negative for shortness of breath.   Cardiovascular:  Negative for chest pain.  Gastrointestinal:  Negative for abdominal pain, nausea and vomiting.  Musculoskeletal:  Positive for back pain (Improved with Tylenol).  Neurological:  Positive for weakness.    Objective:   Vital Signs:  BP (!) 186/82 (BP Location: Left Arm)   Pulse 98   Temp 98.2 F (36.8 C)   Resp 16   Ht 5\' 5"  (1.651 m)   Wt 79.9 kg   SpO2 96%   BMI 29.31 kg/m   Physical Exam: Physical Exam Vitals and nursing note reviewed.  Constitutional:      General: She is not in acute distress.    Appearance: She is ill-appearing. She is not toxic-appearing.  HENT:     Head: Normocephalic and atraumatic.  Cardiovascular:     Rate and Rhythm: Normal rate.  Pulmonary:     Effort: Pulmonary effort is normal. No respiratory distress.     Breath sounds: No wheezing or rhonchi.  Abdominal:     General: Abdomen is flat. Bowel sounds are normal. There is no distension.     Palpations: Abdomen is  soft.     Tenderness: There is no abdominal tenderness.  Skin:    General: Skin is warm and dry.  Neurological:     General: No focal deficit present.     Mental Status: She is alert.  Psychiatric:        Mood and Affect: Mood normal.        Behavior: Behavior normal.     Palliative Assessment/Data: 50-60%    Existing Vynca/ACP Documentation: MOST form signed 03/22/2020  Assessment & Plan:   Impression: Present on Admission:  Hypertensive crisis  Type 2 diabetes mellitus with other circulatory complications  (HCC)  HLD (hyperlipidemia)  Essential hypertension  Anxiety  Hypokalemia  Hypomagnesemia  Atrial fibrillation with RVR (HCC)  Closed compression fracture of L1 vertebra (HCC)  A-fib (HCC)  82 year old female with acute presentation chronic comorbidities as described above.  The patient appears significantly improved today, mental status clearing.  Likely multifactorial including IV narcotic use which she states she generally does not tolerate.  She appears to continue to make improvement, notable that she has transferred out of the ICU to the floor.  Patient's decision maker of choice would be her daughter Donna Green.  Daughter stated yesterday that she has a living will at home which she will try to bring in, although it is still not on the chart.  At this point would like to remain full code and full scope. Overall prognosis guarded   SUMMARY OF RECOMMENDATIONS   Full code/full scope Continue to treat the treatable Daughter to bring in [facility] from home Continued emotional and spiritual support of patient and family At this point goals are clear Palliative medicine will sign off for now Please let us know of any significant clinical change or new palliative needs by reconsulting  Symptom Management:  Per primary team PMT is available to assist as needed  Code Status: Full code  Prognosis: Unable to determine  Discharge Planning: Home with Home Health  Discussed with: Patient, patient's family, medical team, nursing team  Thank you for allowing [place] to participate in the care of Donna Green PMT will continue to support holistically.  Time Total: 40 min  Detailed review of medical records (labs, imaging, vital signs), medically appropriate exam, discussed with treatment team, counseling and education to patient, family, & staff, documenting clinical information, medication management, coordination of care  Donna Dust, NP Palliative Medicine Team  Team Phone # [phone]  (Nights/Weekends)  04/11/2021, 8:17 AM

## 2023-06-07 NOTE — TOC Progression Note (Addendum)
Transition of Care [facility]) - Progression Note    Patient Details  Name: Donna Green MRN: [id number] Date of Birth: 30-Jan-1941  [facility]) CM/SW Contact  Otelia Santee, LCSW Phone Number: 06/07/2023, 12:08 PM  Clinical Narrative:    Spoke with pt's daughter via t/c who has accepted bed offer for SNF at [facility]. [facility] auth requested and approved from 10/25 to 10/29. Auth ID: [id number].  Pt had increased agitation over night and required Haldol. Pt able to transfer to SNF pending 24hr w/ no need for PRN medication for agitation.   ADDENDUM: Per daughter pt does better at home and tends to become more delirious when in facilities. Daughter and PT to re-eval when pt less delirious to determine if pt is able to return home with home health vs SNF. TOC to follow.       Expected Discharge Plan: Skilled Nursing Facility Barriers to Discharge: Continued Medical Work up, Other (must enter comment) (Palliative discussion about goals of care.)  Expected Discharge Plan and Services In-house Referral: Clinical Social Work   Post Acute Care Choice: Skilled Nursing Facility Living arrangements for the past 2 months: Single Family Home                                       Social Determinants of Health (SDOH) Interventions SDOH Screenings   Food Insecurity: No Food Insecurity (06/03/2023)  Housing: Low Risk  (06/03/2023)  Transportation Needs: No Transportation Needs (06/03/2023)  Utilities: Not At Risk (06/03/2023)  Depression (PHQ2-9): Low Risk  (10/13/2022)  Tobacco Use: Low Risk  (06/01/2023)    Readmission Risk Interventions    03/26/2022    2:08 PM  Readmission Risk Prevention Plan  Transportation Screening Complete  PCP or Specialist Appt within 5-7 Days Complete  Home Care Screening Complete  Medication Review (RN CM) Complete

## 2023-06-07 NOTE — Progress Notes (Signed)
Patient agitated overnight, hallucinating, thinking she is on the back porch. Patient given ativan to help sleep, haldol given a couple hours after for agitation. Patient remains agitated, throwing things across the room, triad paged, another x1 dose of haldol ordered, see flowsheets.

## 2023-06-08 DIAGNOSIS — Z515 Encounter for palliative care: Secondary | ICD-10-CM | POA: Diagnosis not present

## 2023-06-08 DIAGNOSIS — I169 Hypertensive crisis, unspecified: Secondary | ICD-10-CM | POA: Diagnosis not present

## 2023-06-08 DIAGNOSIS — Z7189 Other specified counseling: Secondary | ICD-10-CM | POA: Diagnosis not present

## 2023-06-08 DIAGNOSIS — W19XXXA Unspecified fall, initial encounter: Secondary | ICD-10-CM | POA: Diagnosis not present

## 2023-06-08 LAB — GLUCOSE, CAPILLARY
Glucose-Capillary: 149 mg/dL — ABNORMAL HIGH (ref 70–99)
Glucose-Capillary: 259 mg/dL — ABNORMAL HIGH (ref 70–99)
Glucose-Capillary: 225 mg/dL — ABNORMAL HIGH (ref 70–99)
Glucose-Capillary: 238 mg/dL — ABNORMAL HIGH (ref 70–99)

## 2023-06-08 LAB — URINE CULTURE: Culture: >=100000 — AB

## 2023-06-08 MED ORDER — CARVEDILOL 6.25 MG PO TABS
6.2500 mg | ORAL_TABLET | Freq: Two times a day (BID) | ORAL | Status: DC
  Administered 2023-06-08 – 2023-06-10 (×4): 6.25 mg via ORAL
  Filled 2023-06-08 (×4): qty 1

## 2023-06-08 NOTE — Progress Notes (Signed)
PROGRESS NOTE    Donna Green  WUJ:[id number] DOB: [birth year] DOA: 06/01/2023 PCP: Renford Dills, MD   Brief Narrative:  82 y.o. 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  On presentation, her blood pressure was elevated to 209/108 and she was found to be in new onset A-fib with RVR.  Troponins were mildly elevated at 100, then 104, then 105, then 92.  Chest x-ray showed no acute cardiopulmonary process but showed a large hiatal hernia.  CT of the head with no acute intracranial abnormality.  CT cervical spine showed no acute fractures.  CT abdomen/pelvis showed mild L1 superior endplate central compression fracture with slight posterior bowing of the dorsal vertebral cortex but no significant anterior posterior height loss; large hiatal hernia with inverted intrathoracic stomach; diverticulosis without diverticulitis.  Left knee x-ray showed no fracture or effusion.  Left femur x-ray with no acute findings.  Cardiology consulted.  Palliative care consulted for goals of care discussion. Hospital course complicated by delirium and unable to walk. Evaluated to transfer to SNF, however family decided to get some more strength and taking her home.  Assessment & Plan:   New onset paroxysmal A-fib with RVR -Presented with a brief rapid A-fib.  Seen by cardiology.  Patient remains in sinus rhythm now.  Echocardiogram with normal ejection fraction. Currently rate controlled on carvedilol, started on Eliquis and tolerating.   She had some orthostatic dizziness, will decrease dose of carvedilol from 6.25 to 3.125 mg twice daily.  Acute metabolic encephalopathy/hospital-acquired delirium. -Patient presented with fall and trauma to head but no loss of consciousness.   CT of the head was negative for acute intracranial abnormality. -Unclear if the patient has concussion or related to opiates. -Has had intermittent confusion and restlessness: Dilaudid discontinued.  Patient had issues with prolonged confusion in the past after IV narcotics.  Will avoid narcotics.   -Recommend to continue PT OT.  Delirium precautions. Ammonia is normal.  B12 is 196 and borderline low.  Getting B12 injections while in the hospital and recommend oral B12 supplementation on discharge. -Intermittent delirium.  Responded well to Seroquel 12.5 mg in the morning, 25 mg at night.  Close compression fracture of L1 vertebra -Continue thoracolumbar corset.  Fall precautions.  PT evaluation.  Pain management.  Large hiatal hernia -Continue PPI.  Outpatient follow-up with general surgery  History of stroke with residual right-sided deficit Hyperlipidemia -Supportive care.  Outpatient follow-up with neurology.  Continue dipyridamole-aspirin and statin  CKD stage IV -Baseline creatinine around 1.7-2.2.  Creatinine 2.36.  Will need close outpatient monitoring.  Hypertensive crisis in a patient with essential hypertension Blood pressures fluctuate.  Started on carvedilol for rate control.   Now with orthostatic.  Amlodipine discontinued.  Coreg dose reduced.  Hypokalemia/hypomagnesemia. -Improved  Macrocytic anemia -Questionable cause.  Hemoglobin stable.  Supplementing B12 and folic acid.  Anxiety -On as needed Ativan.  Diabetes mellitus type 2 with hyperglycemia -A1c 7.7.  Continue CBGs with SSI.  Continue long-acting insulin  Goals of care -Palliative care following.  Remains full code.  Patient worked with physical therapy today.  She had more alertness and was more cooperative today, however had difficulty with mobilizing.  She will continue to work with PT OT for next 24 to 48 hours to see if she can go  home.  Alternative will be to go to SNF but family is trying to avoid  this because she gets delirious.  DVT prophylaxis: Lovenox Code Status: Full Family Communication: Patient's daughter Ms. Judeth Cornfield on the phone. Disposition Plan: Skilled nursing facility versus home with home health. Status is: inpatient because: Significant delirium.  Debility.    Consultants: Cardiology.  Palliative care  Procedures: Echo  Antimicrobials: None   Subjective:  Patient seen and examined.  Today morning she is more quiet and composed.  She was waiting for her daughter to arrive.  Patient tells me that she is very weak but does not have any pain.  Objective: Vitals:   06/08/23 0500 06/08/23 0630 06/08/23 0730 06/08/23 1216  BP:  (!) 146/72 (!) 152/84 97/64  Pulse:  88 89 79  Resp:  16 12 18   Temp:  99.5 F (37.5 C) 99.1 F (37.3 C) 98.5 F (36.9 C)  TempSrc:  Oral Oral Oral  SpO2:  94%  100%  Weight: 79.9 kg     Height:        Intake/Output Summary (Last 24 hours) at 06/08/2023 1404 Last data filed at 06/08/2023 1610 Gross per 24 hour  Intake 360 ml  Output 1110 ml  Net -750 ml   Filed Weights   06/06/23 0450 06/07/23 0320 06/08/23 0500  Weight: 82.1 kg 79.9 kg 79.9 kg    Examination:  General: Looks fairly comfortable today. Patient is alert and awake.  Oriented to herself and family.  Oriented to place.  Not oriented to time and situation. Flat affect.  She has some weakness on the right side. Cardiovascular: S1-S2 normal.  Regular rate rhythm. Respiratory: Bilateral clear.  Poor inspiratory effort. Gastrointestinal: Soft.  Nontender.  Bowel sound present. Ext: No swelling or edema.  No cyanosis. Musculoskeletal: No deformities.      Data Reviewed: I have personally reviewed following labs and imaging studies  CBC: Recent Labs  Lab 06/02/23 0132 06/02/23 0519 06/03/23 0757 06/04/23 0301  WBC 8.7 7.5 7.8 6.6  NEUTROABS 6.3 4.4 6.4 4.1  HGB 12.8 11.6* 11.3* 10.5*  HCT 39.1 35.0* 35.9* 33.7*  MCV 101.8* 101.7* 107.2* 106.3*   PLT 237 212 162 177   Basic Metabolic Panel: Recent Labs  Lab 06/02/23 0519 06/03/23 0757 06/04/23 0301 06/05/23 0254 06/07/23 0806  NA 140 137 138 136 138  K 3.3* 3.7 3.5 4.1 4.0  CL 107 109 110 110 109  CO2 21* 18* 20* 20* 19*  GLUCOSE 216* 204* 201* 170* 152*  BUN 29* 31* 35* 36* 37*  CREATININE 1.98* 2.09* 2.16* 2.56* 2.36*  CALCIUM 8.3* 7.9* 7.8* 7.8* 9.1  MG 1.1* 1.7 1.6* 2.1  --    GFR: Estimated Creatinine Clearance: 18.9 mL/min (A) (by C-G formula based on SCr of 2.36 mg/dL (H)). Liver Function Tests: Recent Labs  Lab 06/02/23 0519 06/04/23 0301  AST 19 29  ALT 11 15  ALKPHOS 42 40  BILITOT 0.4 0.7  PROT 6.4* 6.3*  ALBUMIN 3.3* 2.9*   No results for input(s): "LIPASE", "AMYLASE" in the last 168 hours. Recent Labs  Lab 06/04/23 0301  AMMONIA 28   Coagulation Profile: No results for input(s): "INR", "PROTIME" in the last 168 hours. Cardiac Enzymes: No results for input(s): "CKTOTAL", "CKMB", "CKMBINDEX", "TROPONINI" in the last 168 hours. BNP (last 3 results) No results for input(s): "PROBNP" in the last 8760 hours. HbA1C: No results for input(s): "HGBA1C" in the last 72 hours.  CBG: Recent Labs  Lab  06/07/23 1158 06/07/23 1747 06/07/23 2302 06/08/23 0717 06/08/23 1137  GLUCAP 189* 148* 159* 149* 259*   Lipid Profile: No results for input(s): "CHOL", "HDL", "LDLCALC", "TRIG", "CHOLHDL", "LDLDIRECT" in the last 72 hours. Thyroid Function Tests: No results for input(s): "TSH", "T4TOTAL", "FREET4", "T3FREE", "THYROIDAB" in the last 72 hours.  Anemia Panel: No results for input(s): "VITAMINB12", "FOLATE", "FERRITIN", "TIBC", "IRON", "RETICCTPCT" in the last 72 hours.  Sepsis Labs: No results for input(s): "PROCALCITON", "LATICACIDVEN" in the last 168 hours.  Recent Results (from the past 240 hour(s))  MRSA Next Gen by PCR, Nasal     Status: None   Collection Time: 06/02/23  8:54 AM   Specimen: Nasal Mucosa; Nasal Swab  Result Value Ref  Range Status   MRSA by PCR Next Gen NOT DETECTED NOT DETECTED Final    Comment: (NOTE) The GeneXpert MRSA Assay (FDA approved for NASAL specimens only), is one component of a comprehensive MRSA colonization surveillance program. It is not intended to diagnose MRSA infection nor to guide or monitor treatment for MRSA infections. Test performance is not FDA approved in patients less than 80 years old. Performed at [facility], [address] [place], [place] [address]   Culture, blood (Routine X 2) w Reflex to ID Panel     Status: None (Preliminary result)   Collection Time: 06/04/23  8:20 AM   Specimen: BLOOD  Result Value Ref Range Status   Specimen Description   Final    BLOOD BLOOD LEFT ARM ANAEROBIC BOTTLE ONLY Performed at [facility], [address] [place], [place] [address]    Special Requests   Final    BOTTLES DRAWN AEROBIC AND ANAEROBIC Blood Culture adequate volume Performed at [facility], [address], [place], [place] [address]    Culture   Final    NO GROWTH 4 DAYS Performed at [facility], [address], [place], [place] [address]    Report Status PENDING  Incomplete  Culture, blood (Routine X 2) w Reflex to ID Panel     Status: None (Preliminary result)   Collection Time: 06/04/23  8:20 AM   Specimen: BLOOD  Result Value Ref Range Status   Specimen Description   Final    BLOOD BLOOD LEFT HAND ANAEROBIC BOTTLE ONLY Performed at [facility], [address], [place], [place] [address]    Special Requests   Final    BOTTLES DRAWN AEROBIC AND ANAEROBIC Blood Culture adequate volume Performed at [facility], [address], [place], [place] [address]    Culture   Final    NO GROWTH 4 DAYS Performed at [facility], [address], [place], [place] [address]    Report Status PENDING  Incomplete  Urine Culture (for pregnant, neutropenic or  urologic patients or patients with an indwelling urinary catheter)     Status: Abnormal   Collection Time: 06/04/23 12:44 PM   Specimen: Urine, Clean Catch  Result Value Ref Range Status   Specimen Description   Final    URINE, CLEAN CATCH Performed at [facility], [address], [place], [place] [address]    Special Requests   Final    NONE Performed at [facility], [address], [place], [place] [address]    Culture (A)  Final    >=100,000 COLONIES/mL KLEBSIELLA AEROGENES >=100,000 COLONIES/mL DIPHTHEROIDS(CORYNEBACTERIUM SPECIES) Standardized susceptibility testing for this organism is not available. Performed  at [facility], [address], [place], [place] [address]    Report Status 06/08/2023 FINAL  Final   Organism ID, Bacteria KLEBSIELLA AEROGENES (A)  Final      Susceptibility   Klebsiella aerogenes - MIC*    CEFEPIME <=0.12 SENSITIVE Sensitive     CEFTRIAXONE <=0.25 SENSITIVE Sensitive     CIPROFLOXACIN <=0.25 SENSITIVE Sensitive     GENTAMICIN <=1 SENSITIVE Sensitive     IMIPENEM <=0.25 SENSITIVE Sensitive     NITROFURANTOIN 64 INTERMEDIATE Intermediate     TRIMETH/SULFA <=20 SENSITIVE Sensitive     PIP/TAZO <=4 SENSITIVE Sensitive ug/mL    * >=100,000 COLONIES/mL KLEBSIELLA AEROGENES         Radiology Studies: No results found.      Scheduled Meds:  apixaban  2.5 mg Oral BID   carvedilol  6.25 mg Oral BID WC   feeding supplement  237 mL Oral BID BM   folic acid  1 mg Oral Daily   insulin aspart  0-9 Units Subcutaneous TID WC   insulin glargine-yfgn  14 Units Subcutaneous QHS   pantoprazole  40 mg Oral Daily   QUEtiapine  12.5 mg Oral q morning   QUEtiapine  25 mg Oral QHS   rosuvastatin  10 mg Oral Daily   Continuous Infusions:        Dorcas Carrow, MD [facility] 06/08/2023, 2:04 PM

## 2023-06-08 NOTE — Progress Notes (Signed)
Physical Therapy Treatment Patient Details Name: Donna Green MRN: [id number] DOB: 03/28/1941 Today's Date: 06/08/2023   History of Present Illness Patient is 82 y.o. female admitted on 06/02/2023 for the evaluation of afib after sustaining a fall from home. CT of head and C-spine negative for acute injury, CT of abdomen/pelvis without contrast showing a mild L1 superior endplate central compression fx w/ slight posterior bowing of the dorsal vertebral cortex but no significant anterior posterior height loss. PMH significant for DM, HTN, HLD, CVA x 2 (2007, 2011) w/ residual Rt hemiparesis, CKD IV    PT Comments  Daughter present for session to observe how her mom was doing in possible preparation for home. Explained that she had stood with PTA the other day with TOT A of 2.  Today her bed mobility to EOB was improved. However, she became dizzy and "woozy" and had to return supine. BP was 108/66 HR 73.  At this time do not feel she is safe to d/c home based.   If plan is discharge home, recommend the following: Two people to help with walking and/or transfers;Assistance with cooking/housework;Assist for transportation;Help with stairs or ramp for entrance;Two people to help with bathing/dressing/bathroom   Can travel by private vehicle     No  Equipment Recommendations  None recommended by PT    Recommendations for Other Services       Precautions / Restrictions Precautions Precautions: Fall Precaution Comments: back, per previous PT note, RN stated TLSO was disccontinued but order still seems active.  Brace does not fit well. Required Braces or Orthoses: Spinal Brace Spinal Brace: Thoracolumbosacral orthotic;Applied in sitting position Restrictions Weight Bearing Restrictions: No     Mobility  Bed Mobility Overal bed mobility: Needs Assistance Bed Mobility: Rolling, Sidelying to Sit Rolling: Mod assist, Used rails Sidelying to sit: Mod assist, Max assist, +2 for physical  assistance   Sit to supine: Max assist, +2 for physical assistance   General bed mobility comments: MOD to MAX A of 1-2 people to get EOB with heavy use of rail.  Once sitting donned brace, but it doesn't fit well.  While adjusting it, she reported feeling dizzy.  PT Tech went to get the dynamap and while she was getting it pt stated she had to lay down. BP 108/66, HR 73. After several minutes 112/61 HR 76.    Transfers                   General transfer comment: Unable to attempt standing due to dizziness and need to return supine.    Ambulation/Gait                   Stairs             Wheelchair Mobility     Tilt Bed    Modified Rankin (Stroke Patients Only)       Balance Overall balance assessment: Needs assistance, History of Falls Sitting-balance support: Bilateral upper extremity supported, Feet unsupported Sitting balance-Leahy Scale: Fair Sitting balance - Comments: head forward                                    Cognition Arousal: Alert Behavior During Therapy: Flat affect  Exercises      General Comments        Pertinent Vitals/Pain Pain Assessment Pain Assessment: Faces Faces Pain Scale: Hurts even more Pain Location: back Pain Descriptors / Indicators: Grimacing Pain Intervention(s): Monitored during session, Repositioned    Home Living                          Prior Function            PT Goals (current goals can now be found in the care plan section) Progress towards PT goals: Progressing toward goals    Frequency    Min 1X/week      PT Plan      Co-evaluation              AM-PAC PT "6 Clicks" Mobility   Outcome Measure  Help needed turning from your back to your side while in a flat bed without using bedrails?: Total Help needed moving from lying on your back to sitting on the side of a flat bed without using  bedrails?: Total Help needed moving to and from a bed to a chair (including a wheelchair)?: Total Help needed standing up from a chair using your arms (e.g., wheelchair or bedside chair)?: Total Help needed to walk in hospital room?: Total Help needed climbing 3-5 steps with a railing? : Total 6 Click Score: 6    End of Session Equipment Utilized During Treatment: Gait belt Activity Tolerance: Treatment limited secondary to medical complications (Comment);Other (comment) (dizziness) Patient left: with bed alarm set;with call bell/phone within reach;with family/visitor present Nurse Communication: Mobility status PT Visit Diagnosis: Muscle weakness (generalized) (M62.81);History of falling (Z91.81);Difficulty in walking, not elsewhere classified (R26.2)     Time: 7564-3329 PT Time Calculation (min) (ACUTE ONLY): 24 min  Charges:    $Therapeutic Activity: 23-37 mins PT General Charges $$ ACUTE PT VISIT: 1 Visit                     Colin Broach., PT Office [phone] Acute Rehab 06/08/2023    Enzo Montgomery 06/08/2023, 1:05 PM

## 2023-06-08 NOTE — Progress Notes (Signed)
PT Cancellation Note  Patient Details Name: Donna Green MRN: [id number] DOB: 06-24-1941   Cancelled Treatment:    Reason Eval/Treat Not Completed: Other (comment). MD requested pt to be seen with family and they would be here after 9:00.  As of now they are not here, so will check back later.  Colin Broach., PT Office [phone] Acute Rehab 06/08/2023    Enzo Montgomery 06/08/2023, 9:45 AM

## 2023-06-08 NOTE — Plan of Care (Signed)
  Problem: Health Behavior/Discharge Planning: Goal: Ability to manage health-related needs will improve Outcome: Progressing   Problem: Elimination: Goal: Will not experience complications related to bowel motility Outcome: Progressing Goal: Will not experience complications related to urinary retention Outcome: Progressing   Problem: Education: Goal: Individualized Educational Video(s) Outcome: Progressing

## 2023-06-08 NOTE — Plan of Care (Signed)
  Problem: Education: Goal: Knowledge of General Education information will improve Description Including pain rating scale, medication(s)/side effects and non-pharmacologic comfort measures Outcome: Progressing   Problem: Clinical Measurements: Goal: Will remain free from infection Outcome: Progressing   Problem: Nutrition: Goal: Adequate nutrition will be maintained Outcome: Progressing   Problem: Coping: Goal: Level of anxiety will decrease Outcome: Progressing   

## 2023-06-09 DIAGNOSIS — I169 Hypertensive crisis, unspecified: Secondary | ICD-10-CM | POA: Diagnosis not present

## 2023-06-09 DIAGNOSIS — W19XXXA Unspecified fall, initial encounter: Secondary | ICD-10-CM | POA: Diagnosis not present

## 2023-06-09 DIAGNOSIS — Z7189 Other specified counseling: Secondary | ICD-10-CM | POA: Diagnosis not present

## 2023-06-09 DIAGNOSIS — Z515 Encounter for palliative care: Secondary | ICD-10-CM | POA: Diagnosis not present

## 2023-06-09 LAB — CULTURE, BLOOD (ROUTINE X 2)
Culture: NO GROWTH
Culture: NO GROWTH
Special Requests: ADEQUATE
Special Requests: ADEQUATE

## 2023-06-09 LAB — GLUCOSE, CAPILLARY
Glucose-Capillary: 187 mg/dL — ABNORMAL HIGH (ref 70–99)
Glucose-Capillary: 268 mg/dL — ABNORMAL HIGH (ref 70–99)
Glucose-Capillary: 199 mg/dL — ABNORMAL HIGH (ref 70–99)
Glucose-Capillary: 188 mg/dL — ABNORMAL HIGH (ref 70–99)

## 2023-06-09 MED ORDER — POLYETHYLENE GLYCOL 3350 17 G PO PACK
17.0000 g | PACK | Freq: Every day | ORAL | Status: DC
  Administered 2023-06-09 – 2023-06-10 (×2): 17 g via ORAL
  Filled 2023-06-09 (×2): qty 1

## 2023-06-09 NOTE — Plan of Care (Signed)
  Problem: Nutrition: Goal: Adequate nutrition will be maintained Outcome: Progressing   Problem: Safety: Goal: Ability to remain free from injury will improve Outcome: Progressing   Problem: Pain Managment: Goal: General experience of comfort will improve Outcome: Progressing   

## 2023-06-09 NOTE — Progress Notes (Signed)
PROGRESS NOTE    Donna Green  OZH:[id number] DOB: 03-Nov-1940 DOA: 06/01/2023 PCP: Renford Dills, MD   Brief Narrative:  82 y.o. 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  On presentation, her blood pressure was elevated to 209/108 and she was found to be in new onset A-fib with RVR.  Troponins were mildly elevated at 100, then 104, then 105, then 92.  Chest x-ray showed no acute cardiopulmonary process but showed a large hiatal hernia.  CT of the head with no acute intracranial abnormality.  CT cervical spine showed no acute fractures.  CT abdomen/pelvis showed mild L1 superior endplate central compression fracture with slight posterior bowing of the dorsal vertebral cortex but no significant anterior posterior height loss; large hiatal hernia with inverted intrathoracic stomach; diverticulosis without diverticulitis.  Left knee x-ray showed no fracture or effusion.  Left femur x-ray with no acute findings.  Cardiology consulted.  Palliative care consulted for goals of care discussion. Hospital course complicated by delirium and unable to walk. Delirium is improving.  She is still very weak and unable to mobilize.  Assessment & Plan:   New onset paroxysmal A-fib with RVR -Presented with a brief rapid A-fib.  Seen by cardiology.  Patient remains in sinus rhythm now.  Echocardiogram with normal ejection fraction. Currently rate controlled on carvedilol, started on Eliquis and tolerating.   She had some orthostatic dizziness, will decrease dose of carvedilol from 6.25 to 3.125 mg twice daily.  Acute metabolic encephalopathy/hospital-acquired delirium. -Patient presented with fall and trauma to head but no loss of consciousness.  CT of the head was negative  for acute intracranial abnormality. -Unclear if the patient has concussion or related to opiates. -Has had intermittent confusion and restlessness: Dilaudid discontinued.  Patient had issues with prolonged confusion in the past after IV narcotics.  Will avoid narcotics.   -Recommend to continue PT OT.  Delirium precautions. Ammonia is normal.  B12 is 196 and borderline low.  Getting B12 injections while in the hospital and recommend oral B12 supplementation on discharge. -Intermittent delirium.  Responded well to Seroquel 12.5 mg in the morning, 25 mg at night.  Mostly improved.  Close compression fracture of L1 vertebra -Continue thoracolumbar corset.  Fall precautions.  PT evaluation.  Pain management.  Large hiatal hernia -Continue PPI.  Outpatient follow-up with general surgery  History of stroke with residual right-sided deficit Hyperlipidemia -Supportive care.  Outpatient follow-up with neurology.  Continue dipyridamole-aspirin on discharge and statin  CKD stage IV -Baseline creatinine around 1.7-2.2.  Creatinine 2.36.  Will need close outpatient monitoring.  Hypertensive crisis in a patient with essential hypertension Blood pressures fluctuate.  Started on carvedilol for rate control.   Now with orthostatic.  Amlodipine discontinued.  Coreg dose reduced. Since high risk of orthostatic symptoms, will have to accept some hypertension.  Hypokalemia/hypomagnesemia. -Improved  Macrocytic anemia -Questionable cause.  Hemoglobin stable.  Supplementing B12 and folic acid.  Anxiety -On as needed Ativan.  Diabetes mellitus type 2 with hyperglycemia -A1c 7.7.  Continue CBGs with SSI.  Continue long-acting insulin  Goals of care -Palliative care following.  Remains full code.  Continues to remain debilitated.  She will continue to work with PT OT, however at this time think she might have to go to a SNF.  DVT prophylaxis:  Lovenox Code Status: Full Family Communication: None  today. Disposition Plan: Skilled nursing facility versus home with home health. Status is: inpatient because: Significant delirium.  Debility.    Consultants: Cardiology.  Palliative care  Procedures: Echo  Antimicrobials: None   Subjective:  Patient seen and examined.  Nurse at the bedside.  Patient keeps changing her story. She asked me what MI giving her for her nerves.  She tells me she intermittently gets these bad dreams. Patient complained of pain on the right flank to the bedside nurse, however she complained about pain on the left side of the flank to me.  Objective: Vitals:   06/09/23 0500 06/09/23 0508 06/09/23 0817 06/09/23 1249  BP:  (!) 182/74 (!) 164/80 (!) 141/77  Pulse:  94 95 89  Resp:  16  17  Temp:  98.5 F (36.9 C)  98.8 F (37.1 C)  TempSrc:  Oral    SpO2:  94%  94%  Weight: 79.9 kg     Height:        Intake/Output Summary (Last 24 hours) at 06/09/2023 1319 Last data filed at 06/09/2023 0921 Gross per 24 hour  Intake 340 ml  Output 900 ml  Net -560 ml   Filed Weights   06/07/23 0320 06/08/23 0500 06/09/23 0500  Weight: 79.9 kg 79.9 kg 79.9 kg    Examination:  General: Frail comfortable.  Alert awake and oriented to herself.  Remains calm and composed today. Flat affect.  She has some weakness on the right side but not very apparent. Cardiovascular: S1-S2 normal.  Regular rate rhythm. Respiratory: Bilateral clear.  Poor inspiratory effort. Gastrointestinal: Soft.  Nontender.  Bowel sound present.  She does have some diffuse tenderness on the right left flank. Ext: No swelling or edema.  No cyanosis. Musculoskeletal: No deformities.      Data Reviewed: I have personally reviewed following labs and imaging studies  CBC: Recent Labs  Lab 06/03/23 0757 06/04/23 0301  WBC 7.8 6.6  NEUTROABS 6.4 4.1  HGB 11.3* 10.5*  HCT 35.9* 33.7*  MCV 107.2* 106.3*  PLT 162 177   Basic Metabolic Panel: Recent Labs  Lab 06/03/23 0757  06/04/23 0301 06/05/23 0254 06/07/23 0806  NA 137 138 136 138  K 3.7 3.5 4.1 4.0  CL 109 110 110 109  CO2 18* 20* 20* 19*  GLUCOSE 204* 201* 170* 152*  BUN 31* 35* 36* 37*  CREATININE 2.09* 2.16* 2.56* 2.36*  CALCIUM 7.9* 7.8* 7.8* 9.1  MG 1.7 1.6* 2.1  --    GFR: Estimated Creatinine Clearance: 18.9 mL/min (A) (by C-G formula based on SCr of 2.36 mg/dL (H)). Liver Function Tests: Recent Labs  Lab 06/04/23 0301  AST 29  ALT 15  ALKPHOS 40  BILITOT 0.7  PROT 6.3*  ALBUMIN 2.9*   No results for input(s): "LIPASE", "AMYLASE" in the last 168 hours. Recent Labs  Lab 06/04/23 0301  AMMONIA 28   Coagulation Profile: No results for input(s): "INR", "PROTIME" in the last 168 hours. Cardiac Enzymes: No results for input(s): "CKTOTAL", "CKMB", "CKMBINDEX", "TROPONINI" in the last 168 hours. BNP (last 3 results) No results for input(s): "PROBNP" in the last 8760 hours. HbA1C: No results for input(s): "HGBA1C" in the last 72 hours.  CBG: Recent Labs  Lab 06/08/23 1137 06/08/23 1601 06/08/23 2113 06/09/23 0743 06/09/23 1158  GLUCAP 259* 225* 238* 187* 268*   Lipid Profile: No results for input(s): "CHOL", "HDL", "LDLCALC", "TRIG", "CHOLHDL", "LDLDIRECT" in the last 72 hours.  Thyroid Function Tests: No results for input(s): "TSH", "T4TOTAL", "FREET4", "T3FREE", "THYROIDAB" in the last 72 hours.  Anemia Panel: No results for input(s): "VITAMINB12", "FOLATE", "FERRITIN", "TIBC", "IRON", "RETICCTPCT" in the last 72 hours.  Sepsis Labs: No results for input(s): "PROCALCITON", "LATICACIDVEN" in the last 168 hours.  Recent Results (from the past 240 hour(s))  MRSA Next Gen by PCR, Nasal     Status: None   Collection Time: 06/02/23  8:54 AM   Specimen: Nasal Mucosa; Nasal Swab  Result Value Ref Range Status   MRSA by PCR Next Gen NOT DETECTED NOT DETECTED Final    Comment: (NOTE) The GeneXpert MRSA Assay (FDA approved for NASAL specimens only), is one component of a  comprehensive MRSA colonization surveillance program. It is not intended to diagnose MRSA infection nor to guide or monitor treatment for MRSA infections. Test performance is not FDA approved in patients less than 51 years old. Performed at [facility], [address] [place], [place] [address]   Culture, blood (Routine X 2) w Reflex to ID Panel     Status: None   Collection Time: 06/04/23  8:20 AM   Specimen: BLOOD  Result Value Ref Range Status   Specimen Description   Final    BLOOD BLOOD LEFT ARM ANAEROBIC BOTTLE ONLY Performed at [facility], [address] [place], [place] [address]    Special Requests   Final    BOTTLES DRAWN AEROBIC AND ANAEROBIC Blood Culture adequate volume Performed at [facility], [address], [place], [place] [address]    Culture   Final    NO GROWTH 5 DAYS Performed at [facility], [address] [place], [place] [address]    Report Status 06/09/2023 FINAL  Final  Culture, blood (Routine X 2) w Reflex to ID Panel     Status: None   Collection Time: 06/04/23  8:20 AM   Specimen: BLOOD  Result Value Ref Range Status   Specimen Description   Final    BLOOD BLOOD LEFT HAND ANAEROBIC BOTTLE ONLY Performed at [facility], [address] [place], [place] [address]    Special Requests   Final    BOTTLES DRAWN AEROBIC AND ANAEROBIC Blood Culture adequate volume Performed at [facility], [address], [place], [place] [address]    Culture   Final    NO GROWTH 5 DAYS Performed at [facility], [address] [place], [place] [address]    Report Status 06/09/2023 FINAL  Final  Urine Culture (for pregnant, neutropenic or urologic patients or patients with an indwelling urinary catheter)     Status: Abnormal   Collection Time: 06/04/23 12:44 PM   Specimen: Urine, Clean Catch  Result Value Ref Range Status   Specimen Description    Final    URINE, CLEAN CATCH Performed at [facility], [address] [place], [place] [address]    Special Requests   Final    NONE Performed at [facility], [address], [place], [place] [address]    Culture (A)  Final    >=100,000 COLONIES/mL KLEBSIELLA AEROGENES >=100,000 COLONIES/mL DIPHTHEROIDS(CORYNEBACTERIUM SPECIES) Standardized susceptibility testing for this organism is not available. Performed at [name], [address], [place], [place] [address]    Report Status 06/08/2023 FINAL  Final   Organism ID, Bacteria KLEBSIELLA AEROGENES (A)  Final      Susceptibility  Klebsiella aerogenes - MIC*    CEFEPIME <=0.12 SENSITIVE Sensitive     CEFTRIAXONE <=0.25 SENSITIVE Sensitive     CIPROFLOXACIN <=0.25 SENSITIVE Sensitive     GENTAMICIN <=1 SENSITIVE Sensitive     IMIPENEM <=0.25 SENSITIVE Sensitive     NITROFURANTOIN 64 INTERMEDIATE Intermediate     TRIMETH/SULFA <=20 SENSITIVE Sensitive     PIP/TAZO <=4 SENSITIVE Sensitive ug/mL    * >=100,000 COLONIES/mL KLEBSIELLA AEROGENES         Radiology Studies: No results found.      Scheduled Meds:  apixaban  2.5 mg Oral BID   carvedilol  6.25 mg Oral BID WC   feeding supplement  237 mL Oral BID BM   folic acid  1 mg Oral Daily   insulin aspart  0-9 Units Subcutaneous TID WC   insulin glargine-yfgn  14 Units Subcutaneous QHS   pantoprazole  40 mg Oral Daily   QUEtiapine  12.5 mg Oral q morning   QUEtiapine  25 mg Oral QHS   rosuvastatin  10 mg Oral Daily   Continuous Infusions:        Dorcas Carrow, MD [facility] 06/09/2023, 1:19 PM

## 2023-06-09 NOTE — Plan of Care (Signed)
  Problem: Health Behavior/Discharge Planning: Goal: Ability to manage health-related needs will improve Outcome: Progressing   Problem: Clinical Measurements: Goal: Ability to maintain clinical measurements within normal limits will improve Outcome: Progressing Goal: Will remain free from infection Outcome: Progressing Goal: Cardiovascular complication will be avoided Outcome: Progressing   Problem: Activity: Goal: Risk for activity intolerance will decrease Outcome: Progressing   Problem: Nutrition: Goal: Adequate nutrition will be maintained Outcome: Progressing   Problem: Coping: Goal: Level of anxiety will decrease Outcome: Progressing   Problem: Elimination: Goal: Will not experience complications related to bowel motility Outcome: Progressing Goal: Will not experience complications related to urinary retention Outcome: Progressing   Problem: Pain Managment: Goal: General experience of comfort will improve Outcome: Progressing   Problem: Safety: Goal: Ability to remain free from injury will improve Outcome: Progressing   Problem: Skin Integrity: Goal: Risk for impaired skin integrity will decrease Outcome: Progressing

## 2023-06-09 NOTE — Progress Notes (Signed)
Mobility Specialist - Progress Note   06/09/23 1329  Mobility  Activity Dangled on edge of bed  Level of Assistance +2 (takes two people)  Activity Response Tolerated well  Mobility Referral No  $Mobility charge 1 Mobility  Mobility Specialist Start Time (ACUTE ONLY) 0115  Mobility Specialist Stop Time (ACUTE ONLY) 0128  Mobility Specialist Time Calculation (min) (ACUTE ONLY) 13 min   Pt received in bed and agreeable to sit EOB for orthostatic vitals. Pt required +2 assistance from supine>sitting. C/o back pain throughout session. No other complaints during session. Pt to bed after session with all needs met.   [facility]

## 2023-06-10 DIAGNOSIS — G8190 Hemiplegia, unspecified affecting unspecified side: Secondary | ICD-10-CM | POA: Diagnosis not present

## 2023-06-10 DIAGNOSIS — E875 Hyperkalemia: Secondary | ICD-10-CM | POA: Diagnosis not present

## 2023-06-10 DIAGNOSIS — G459 Transient cerebral ischemic attack, unspecified: Secondary | ICD-10-CM | POA: Diagnosis not present

## 2023-06-10 DIAGNOSIS — I169 Hypertensive crisis, unspecified: Secondary | ICD-10-CM | POA: Diagnosis not present

## 2023-06-10 DIAGNOSIS — Z515 Encounter for palliative care: Secondary | ICD-10-CM | POA: Diagnosis not present

## 2023-06-10 DIAGNOSIS — Z7189 Other specified counseling: Secondary | ICD-10-CM | POA: Diagnosis not present

## 2023-06-10 DIAGNOSIS — W19XXXA Unspecified fall, initial encounter: Secondary | ICD-10-CM | POA: Diagnosis not present

## 2023-06-10 DIAGNOSIS — N189 Chronic kidney disease, unspecified: Secondary | ICD-10-CM | POA: Diagnosis not present

## 2023-06-10 DIAGNOSIS — S32010D Wedge compression fracture of first lumbar vertebra, subsequent encounter for fracture with routine healing: Secondary | ICD-10-CM | POA: Diagnosis not present

## 2023-06-10 DIAGNOSIS — E1159 Type 2 diabetes mellitus with other circulatory complications: Secondary | ICD-10-CM | POA: Diagnosis not present

## 2023-06-10 DIAGNOSIS — I4891 Unspecified atrial fibrillation: Secondary | ICD-10-CM | POA: Diagnosis not present

## 2023-06-10 DIAGNOSIS — Z7401 Bed confinement status: Secondary | ICD-10-CM | POA: Diagnosis not present

## 2023-06-10 DIAGNOSIS — M6281 Muscle weakness (generalized): Secondary | ICD-10-CM | POA: Diagnosis not present

## 2023-06-10 DIAGNOSIS — R278 Other lack of coordination: Secondary | ICD-10-CM | POA: Diagnosis not present

## 2023-06-10 DIAGNOSIS — R262 Difficulty in walking, not elsewhere classified: Secondary | ICD-10-CM | POA: Diagnosis not present

## 2023-06-10 LAB — GLUCOSE, CAPILLARY
Glucose-Capillary: 145 mg/dL — ABNORMAL HIGH (ref 70–99)
Glucose-Capillary: 300 mg/dL — ABNORMAL HIGH (ref 70–99)

## 2023-06-10 MED ORDER — QUETIAPINE FUMARATE 25 MG PO TABS: 12.5000 mg | ORAL_TABLET | Freq: Every day | ORAL | 0 refills | Status: DC

## 2023-06-10 MED ORDER — POLYETHYLENE GLYCOL 3350 17 G PO PACK: 17.0000 g | PACK | Freq: Every day | ORAL | 0 refills | Status: AC

## 2023-06-10 MED ORDER — CARVEDILOL 6.25 MG PO TABS: 6.2500 mg | ORAL_TABLET | Freq: Two times a day (BID) | ORAL | Status: DC

## 2023-06-10 MED ORDER — ACETAMINOPHEN 325 MG PO TABS: 650.0000 mg | ORAL_TABLET | Freq: Four times a day (QID) | ORAL | Status: DC | PRN

## 2023-06-10 MED ORDER — CEPHALEXIN 500 MG PO CAPS: 500.0000 mg | ORAL_CAPSULE | Freq: Three times a day (TID) | ORAL | Status: AC

## 2023-06-10 MED ORDER — LORAZEPAM 0.5 MG PO TABS: 0.5000 mg | ORAL_TABLET | Freq: Three times a day (TID) | ORAL | 0 refills | Status: DC | PRN

## 2023-06-10 MED ORDER — SODIUM CHLORIDE 0.9 % IV SOLN
1.0000 g | Freq: Once | INTRAVENOUS | Status: AC
  Administered 2023-06-10: 1 g via INTRAVENOUS
  Filled 2023-06-10: qty 10

## 2023-06-10 MED ORDER — LORAZEPAM 0.5 MG PO TABS: 0.5000 mg | ORAL_TABLET | Freq: Three times a day (TID) | ORAL | 0 refills | Status: AC | PRN

## 2023-06-10 MED ORDER — VITAMIN B-12 1000 MCG PO TABS: 1000.0000 ug | ORAL_TABLET | Freq: Every day | ORAL | Status: DC

## 2023-06-10 MED ORDER — APIXABAN 2.5 MG PO TABS: 2.5000 mg | ORAL_TABLET | Freq: Two times a day (BID) | ORAL | 2 refills | Status: AC

## 2023-06-10 MED ORDER — QUETIAPINE FUMARATE 25 MG PO TABS: 12.5000 mg | ORAL_TABLET | Freq: Every day | ORAL | Status: DC

## 2023-06-10 NOTE — Progress Notes (Signed)
[phone] [facility], This nurse Burlon Centrella attempted to give report to this nurse three times no answer, Left voicemail gave number to call back to.

## 2023-06-10 NOTE — Discharge Summary (Signed)
MG tablet Commonly known as: ATIVAN Take 1 tablet (0.5 mg total) by mouth every 8 (eight) hours as needed for anxiety. What changed:  when to take this reasons to take this   NovoLOG FlexPen 100 UNIT/ML FlexPen Generic drug: insulin aspart Inject 8 Units into the skin 3 (three) times daily with meals.   polyethylene glycol 17 g packet Commonly known as: MIRALAX / GLYCOLAX Take 17 g by mouth daily. Start taking on: June 11, 2023   QUEtiapine 25 MG tablet Commonly known as: SEROQUEL Take 0.5 tablets (12.5 mg total) by mouth at bedtime.   rosuvastatin 20 MG tablet Commonly known as: CRESTOR Take 20 mg by mouth daily.         Contact information for follow-up providers     Sharlene Dory, PA-C Follow up.   Specialty: Cardiology Why: Tuesday Jun 25, 2023 Arrive by 8:10 AMAppt at 8:25 AM (25 min) Contact information: [address] [place] [address] [phone]              Contact information for after-discharge care     Destination     [facility] Preferred SNF .   Service: Skilled Nursing Contact information: [address] [place] [address] [phone]                    Allergies  Allergen Reactions   Codeine Other (See Comments)    Paranoid  Hallucinations    Dilaudid [Hydromorphone] Anxiety    Hallucinations, agitation, paranoia    Morphine Anxiety    Hallucinations, paranoia, agitation   Colcrys [Colchicine] Nausea Only   Zyloprim [Allopurinol] Nausea Only   Nsaids Other (See Comments)    Stage 3 kidney disease    Consultations: None   Procedures/Studies: DG CHEST PORT 1 VIEW  Result Date: 06/04/2023 CLINICAL DATA:  Dyspnea. EXAM: PORTABLE CHEST 1 VIEW COMPARISON:  June 02, 2023. FINDINGS: Stable cardiomegaly. Stable large hiatal hernia. Both lungs are clear. The visualized skeletal structures are unremarkable. IMPRESSION: No active disease.  Stable large hiatal hernia. Electronically Signed   By: Lupita Raider M.D.   On: 06/04/2023 12:24   ECHOCARDIOGRAM COMPLETE  Result Date: 06/02/2023    ECHOCARDIOGRAM REPORT   Patient Name:   Donna Green Date of Exam: 06/02/2023 Medical Rec #:  [id number]      Height:       65.0 in Accession #:    [id number]     Weight:       166.7 lb Date of Birth:  01-01-1941      BSA:          1.831 m Patient Age:    82 years       BP:           158/74 mmHg Patient Gender: F              HR:           82 bpm. Exam Location:  Inpatient Procedure: 2D Echo, Cardiac Doppler, Color Doppler and Strain Analysis Indications:    Atrial Fibrillation I48.91  History:         Patient has no prior history of Echocardiogram examinations.                 Stroke; Risk Factors:Diabetes and Hypertension.  Sonographer:    Darlys Gales Referring Phys: [id number] DAVID MANUEL ORTIZ  Sonographer Comments: Global longitudinal strain was attempted. IMPRESSIONS  1. Difficult strain  MG tablet Commonly known as: ATIVAN Take 1 tablet (0.5 mg total) by mouth every 8 (eight) hours as needed for anxiety. What changed:  when to take this reasons to take this   NovoLOG FlexPen 100 UNIT/ML FlexPen Generic drug: insulin aspart Inject 8 Units into the skin 3 (three) times daily with meals.   polyethylene glycol 17 g packet Commonly known as: MIRALAX / GLYCOLAX Take 17 g by mouth daily. Start taking on: June 11, 2023   QUEtiapine 25 MG tablet Commonly known as: SEROQUEL Take 0.5 tablets (12.5 mg total) by mouth at bedtime.   rosuvastatin 20 MG tablet Commonly known as: CRESTOR Take 20 mg by mouth daily.         Contact information for follow-up providers     Sharlene Dory, PA-C Follow up.   Specialty: Cardiology Why: Tuesday Jun 25, 2023 Arrive by 8:10 AMAppt at 8:25 AM (25 min) Contact information: [address] [place] [address] [phone]              Contact information for after-discharge care     Destination     [facility] Preferred SNF .   Service: Skilled Nursing Contact information: [address] [place] [address] [phone]                    Allergies  Allergen Reactions   Codeine Other (See Comments)    Paranoid  Hallucinations    Dilaudid [Hydromorphone] Anxiety    Hallucinations, agitation, paranoia    Morphine Anxiety    Hallucinations, paranoia, agitation   Colcrys [Colchicine] Nausea Only   Zyloprim [Allopurinol] Nausea Only   Nsaids Other (See Comments)    Stage 3 kidney disease    Consultations: None   Procedures/Studies: DG CHEST PORT 1 VIEW  Result Date: 06/04/2023 CLINICAL DATA:  Dyspnea. EXAM: PORTABLE CHEST 1 VIEW COMPARISON:  June 02, 2023. FINDINGS: Stable cardiomegaly. Stable large hiatal hernia. Both lungs are clear. The visualized skeletal structures are unremarkable. IMPRESSION: No active disease.  Stable large hiatal hernia. Electronically Signed   By: Lupita Raider M.D.   On: 06/04/2023 12:24   ECHOCARDIOGRAM COMPLETE  Result Date: 06/02/2023    ECHOCARDIOGRAM REPORT   Patient Name:   Donna Green Date of Exam: 06/02/2023 Medical Rec #:  [id number]      Height:       65.0 in Accession #:    [id number]     Weight:       166.7 lb Date of Birth:  01-01-1941      BSA:          1.831 m Patient Age:    82 years       BP:           158/74 mmHg Patient Gender: F              HR:           82 bpm. Exam Location:  Inpatient Procedure: 2D Echo, Cardiac Doppler, Color Doppler and Strain Analysis Indications:    Atrial Fibrillation I48.91  History:         Patient has no prior history of Echocardiogram examinations.                 Stroke; Risk Factors:Diabetes and Hypertension.  Sonographer:    Darlys Gales Referring Phys: [id number] DAVID MANUEL ORTIZ  Sonographer Comments: Global longitudinal strain was attempted. IMPRESSIONS  1. Difficult strain  Physician Discharge Summary  [name]:[id number] DOB: [birth year] DOA: 06/01/2023  PCP: Renford Dills, MD  Admit date: 06/01/2023 Discharge date: 06/10/2023  Admitted From: Home Disposition: Skilled nursing facility  Recommendations for Outpatient Follow-up:  Follow up with PCP in 1-2 weeks Please obtain BMP/CBC/magnesium in one week   Discharge Condition: Fair CODE STATUS: Full code Diet recommendation: Low-salt and low-carb diet  Discharge summary: 82 y.o. 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  On presentation, her blood pressure was elevated to 209/108 and she was found to be in new onset A-fib with RVR.  Troponins were mildly elevated at 100, then 104, then 105, then 92.  Chest x-ray showed no acute cardiopulmonary process but showed a large hiatal hernia.  CT of the head with no acute intracranial abnormality.  CT cervical spine showed no acute fractures.  CT abdomen/pelvis showed mild L1 superior endplate central compression fracture with slight posterior bowing of the dorsal vertebral cortex but no significant anterior posterior height loss; large hiatal hernia with inverted intrathoracic stomach; diverticulosis without diverticulitis.  Left knee x-ray showed no fracture or effusion.  Left femur x-ray with no acute findings.  Cardiology consulted.  Palliative care consulted for goals of care discussion. Hospital course complicated by delirium and unable to walk. Delirium is improving.  She is still very weak and debilitated.  Difficult to walk.  Going to a SNF today.   Assessment & plan of care:   New onset paroxysmal A-fib with RVR -Presented with a brief rapid A-fib.  Seen by cardiology.  Patient remains in sinus rhythm now.   Echocardiogram with normal ejection fraction. Currently rate controlled on carvedilol, started on Eliquis and tolerating. Discharging with carvedilol 6.25 mg twice daily to avoid orthostatic hypotension.   Acute metabolic encephalopathy/hospital-acquired delirium. Thought to be multifactorial.  Multiple investigations negative. CT head negative.  Some response after stopping narcotics. Ammonia was normal. B12 was 196 and borderline low.  She was given 5 doses of injectable B12 injection in the hospital, will give her oral supplementation on discharge. Had developed intermittent delirium, responded well to Seroquel.  Will put patient on Seroquel 12.5 mg at bedtime.  Once her clinical status improves she can come off of this. All-time fall precautions and delirium precautions. Urinalysis was relatively normal. 10/22, urine culture was positive for more than 100,000 colonies of Klebsiella and diphtheroids.  Since patient was symptomatic, will treat with 1 dose of Rocephin today and continue 6 more days of Keflex by mouth.   Close compression fracture of L1 vertebra -Continue thoracolumbar corset.  Fall precautions.  PT evaluation.  Pain management.  Avoid narcotics.  Local therapies including heating pad, lidocaine, Tylenol.   Large hiatal hernia -Continue PPI.  Outpatient follow-up with general surgery   History of stroke with residual right-sided deficit Hyperlipidemia -Supportive care.  Outpatient follow-up with neurology.  Starting on Eliquis.  Discontinue Aggrenox.  Continue statin.    CKD stage IV -Baseline creatinine around 1.7-2.2.  Creatinine 2.36.  Will need close outpatient monitoring.   Hypertensive crisis in a patient with essential hypertension Blood pressures fluctuate.  Started on carvedilol for rate control.   Develops orthostatic dizziness.  Will discontinue all other antihypertensives.  Keep on Coreg.  With further improvement in mobility, she may need to go back  Physician Discharge Summary  [name]:[id number] DOB: [birth year] DOA: 06/01/2023  PCP: Renford Dills, MD  Admit date: 06/01/2023 Discharge date: 06/10/2023  Admitted From: Home Disposition: Skilled nursing facility  Recommendations for Outpatient Follow-up:  Follow up with PCP in 1-2 weeks Please obtain BMP/CBC/magnesium in one week   Discharge Condition: Fair CODE STATUS: Full code Diet recommendation: Low-salt and low-carb diet  Discharge summary: 82 y.o. 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  On presentation, her blood pressure was elevated to 209/108 and she was found to be in new onset A-fib with RVR.  Troponins were mildly elevated at 100, then 104, then 105, then 92.  Chest x-ray showed no acute cardiopulmonary process but showed a large hiatal hernia.  CT of the head with no acute intracranial abnormality.  CT cervical spine showed no acute fractures.  CT abdomen/pelvis showed mild L1 superior endplate central compression fracture with slight posterior bowing of the dorsal vertebral cortex but no significant anterior posterior height loss; large hiatal hernia with inverted intrathoracic stomach; diverticulosis without diverticulitis.  Left knee x-ray showed no fracture or effusion.  Left femur x-ray with no acute findings.  Cardiology consulted.  Palliative care consulted for goals of care discussion. Hospital course complicated by delirium and unable to walk. Delirium is improving.  She is still very weak and debilitated.  Difficult to walk.  Going to a SNF today.   Assessment & plan of care:   New onset paroxysmal A-fib with RVR -Presented with a brief rapid A-fib.  Seen by cardiology.  Patient remains in sinus rhythm now.   Echocardiogram with normal ejection fraction. Currently rate controlled on carvedilol, started on Eliquis and tolerating. Discharging with carvedilol 6.25 mg twice daily to avoid orthostatic hypotension.   Acute metabolic encephalopathy/hospital-acquired delirium. Thought to be multifactorial.  Multiple investigations negative. CT head negative.  Some response after stopping narcotics. Ammonia was normal. B12 was 196 and borderline low.  She was given 5 doses of injectable B12 injection in the hospital, will give her oral supplementation on discharge. Had developed intermittent delirium, responded well to Seroquel.  Will put patient on Seroquel 12.5 mg at bedtime.  Once her clinical status improves she can come off of this. All-time fall precautions and delirium precautions. Urinalysis was relatively normal. 10/22, urine culture was positive for more than 100,000 colonies of Klebsiella and diphtheroids.  Since patient was symptomatic, will treat with 1 dose of Rocephin today and continue 6 more days of Keflex by mouth.   Close compression fracture of L1 vertebra -Continue thoracolumbar corset.  Fall precautions.  PT evaluation.  Pain management.  Avoid narcotics.  Local therapies including heating pad, lidocaine, Tylenol.   Large hiatal hernia -Continue PPI.  Outpatient follow-up with general surgery   History of stroke with residual right-sided deficit Hyperlipidemia -Supportive care.  Outpatient follow-up with neurology.  Starting on Eliquis.  Discontinue Aggrenox.  Continue statin.    CKD stage IV -Baseline creatinine around 1.7-2.2.  Creatinine 2.36.  Will need close outpatient monitoring.   Hypertensive crisis in a patient with essential hypertension Blood pressures fluctuate.  Started on carvedilol for rate control.   Develops orthostatic dizziness.  Will discontinue all other antihypertensives.  Keep on Coreg.  With further improvement in mobility, she may need to go back  MG tablet Commonly known as: ATIVAN Take 1 tablet (0.5 mg total) by mouth every 8 (eight) hours as needed for anxiety. What changed:  when to take this reasons to take this   NovoLOG FlexPen 100 UNIT/ML FlexPen Generic drug: insulin aspart Inject 8 Units into the skin 3 (three) times daily with meals.   polyethylene glycol 17 g packet Commonly known as: MIRALAX / GLYCOLAX Take 17 g by mouth daily. Start taking on: June 11, 2023   QUEtiapine 25 MG tablet Commonly known as: SEROQUEL Take 0.5 tablets (12.5 mg total) by mouth at bedtime.   rosuvastatin 20 MG tablet Commonly known as: CRESTOR Take 20 mg by mouth daily.         Contact information for follow-up providers     Sharlene Dory, PA-C Follow up.   Specialty: Cardiology Why: Tuesday Jun 25, 2023 Arrive by 8:10 AMAppt at 8:25 AM (25 min) Contact information: [address] [place] [address] [phone]              Contact information for after-discharge care     Destination     [facility] Preferred SNF .   Service: Skilled Nursing Contact information: [address] [place] [address] [phone]                    Allergies  Allergen Reactions   Codeine Other (See Comments)    Paranoid  Hallucinations    Dilaudid [Hydromorphone] Anxiety    Hallucinations, agitation, paranoia    Morphine Anxiety    Hallucinations, paranoia, agitation   Colcrys [Colchicine] Nausea Only   Zyloprim [Allopurinol] Nausea Only   Nsaids Other (See Comments)    Stage 3 kidney disease    Consultations: None   Procedures/Studies: DG CHEST PORT 1 VIEW  Result Date: 06/04/2023 CLINICAL DATA:  Dyspnea. EXAM: PORTABLE CHEST 1 VIEW COMPARISON:  June 02, 2023. FINDINGS: Stable cardiomegaly. Stable large hiatal hernia. Both lungs are clear. The visualized skeletal structures are unremarkable. IMPRESSION: No active disease.  Stable large hiatal hernia. Electronically Signed   By: Lupita Raider M.D.   On: 06/04/2023 12:24   ECHOCARDIOGRAM COMPLETE  Result Date: 06/02/2023    ECHOCARDIOGRAM REPORT   Patient Name:   Donna Green Date of Exam: 06/02/2023 Medical Rec #:  [id number]      Height:       65.0 in Accession #:    [id number]     Weight:       166.7 lb Date of Birth:  01-01-1941      BSA:          1.831 m Patient Age:    82 years       BP:           158/74 mmHg Patient Gender: F              HR:           82 bpm. Exam Location:  Inpatient Procedure: 2D Echo, Cardiac Doppler, Color Doppler and Strain Analysis Indications:    Atrial Fibrillation I48.91  History:         Patient has no prior history of Echocardiogram examinations.                 Stroke; Risk Factors:Diabetes and Hypertension.  Sonographer:    Darlys Gales Referring Phys: [id number] DAVID MANUEL ORTIZ  Sonographer Comments: Global longitudinal strain was attempted. IMPRESSIONS  1. Difficult strain  Physician Discharge Summary  [name]:[id number] DOB: [birth year] DOA: 06/01/2023  PCP: Renford Dills, MD  Admit date: 06/01/2023 Discharge date: 06/10/2023  Admitted From: Home Disposition: Skilled nursing facility  Recommendations for Outpatient Follow-up:  Follow up with PCP in 1-2 weeks Please obtain BMP/CBC/magnesium in one week   Discharge Condition: Fair CODE STATUS: Full code Diet recommendation: Low-salt and low-carb diet  Discharge summary: 82 y.o. 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  On presentation, her blood pressure was elevated to 209/108 and she was found to be in new onset A-fib with RVR.  Troponins were mildly elevated at 100, then 104, then 105, then 92.  Chest x-ray showed no acute cardiopulmonary process but showed a large hiatal hernia.  CT of the head with no acute intracranial abnormality.  CT cervical spine showed no acute fractures.  CT abdomen/pelvis showed mild L1 superior endplate central compression fracture with slight posterior bowing of the dorsal vertebral cortex but no significant anterior posterior height loss; large hiatal hernia with inverted intrathoracic stomach; diverticulosis without diverticulitis.  Left knee x-ray showed no fracture or effusion.  Left femur x-ray with no acute findings.  Cardiology consulted.  Palliative care consulted for goals of care discussion. Hospital course complicated by delirium and unable to walk. Delirium is improving.  She is still very weak and debilitated.  Difficult to walk.  Going to a SNF today.   Assessment & plan of care:   New onset paroxysmal A-fib with RVR -Presented with a brief rapid A-fib.  Seen by cardiology.  Patient remains in sinus rhythm now.   Echocardiogram with normal ejection fraction. Currently rate controlled on carvedilol, started on Eliquis and tolerating. Discharging with carvedilol 6.25 mg twice daily to avoid orthostatic hypotension.   Acute metabolic encephalopathy/hospital-acquired delirium. Thought to be multifactorial.  Multiple investigations negative. CT head negative.  Some response after stopping narcotics. Ammonia was normal. B12 was 196 and borderline low.  She was given 5 doses of injectable B12 injection in the hospital, will give her oral supplementation on discharge. Had developed intermittent delirium, responded well to Seroquel.  Will put patient on Seroquel 12.5 mg at bedtime.  Once her clinical status improves she can come off of this. All-time fall precautions and delirium precautions. Urinalysis was relatively normal. 10/22, urine culture was positive for more than 100,000 colonies of Klebsiella and diphtheroids.  Since patient was symptomatic, will treat with 1 dose of Rocephin today and continue 6 more days of Keflex by mouth.   Close compression fracture of L1 vertebra -Continue thoracolumbar corset.  Fall precautions.  PT evaluation.  Pain management.  Avoid narcotics.  Local therapies including heating pad, lidocaine, Tylenol.   Large hiatal hernia -Continue PPI.  Outpatient follow-up with general surgery   History of stroke with residual right-sided deficit Hyperlipidemia -Supportive care.  Outpatient follow-up with neurology.  Starting on Eliquis.  Discontinue Aggrenox.  Continue statin.    CKD stage IV -Baseline creatinine around 1.7-2.2.  Creatinine 2.36.  Will need close outpatient monitoring.   Hypertensive crisis in a patient with essential hypertension Blood pressures fluctuate.  Started on carvedilol for rate control.   Develops orthostatic dizziness.  Will discontinue all other antihypertensives.  Keep on Coreg.  With further improvement in mobility, she may need to go back  MG tablet Commonly known as: ATIVAN Take 1 tablet (0.5 mg total) by mouth every 8 (eight) hours as needed for anxiety. What changed:  when to take this reasons to take this   NovoLOG FlexPen 100 UNIT/ML FlexPen Generic drug: insulin aspart Inject 8 Units into the skin 3 (three) times daily with meals.   polyethylene glycol 17 g packet Commonly known as: MIRALAX / GLYCOLAX Take 17 g by mouth daily. Start taking on: June 11, 2023   QUEtiapine 25 MG tablet Commonly known as: SEROQUEL Take 0.5 tablets (12.5 mg total) by mouth at bedtime.   rosuvastatin 20 MG tablet Commonly known as: CRESTOR Take 20 mg by mouth daily.         Contact information for follow-up providers     Sharlene Dory, PA-C Follow up.   Specialty: Cardiology Why: Tuesday Jun 25, 2023 Arrive by 8:10 AMAppt at 8:25 AM (25 min) Contact information: [address] [place] [address] [phone]              Contact information for after-discharge care     Destination     [facility] Preferred SNF .   Service: Skilled Nursing Contact information: [address] [place] [address] [phone]                    Allergies  Allergen Reactions   Codeine Other (See Comments)    Paranoid  Hallucinations    Dilaudid [Hydromorphone] Anxiety    Hallucinations, agitation, paranoia    Morphine Anxiety    Hallucinations, paranoia, agitation   Colcrys [Colchicine] Nausea Only   Zyloprim [Allopurinol] Nausea Only   Nsaids Other (See Comments)    Stage 3 kidney disease    Consultations: None   Procedures/Studies: DG CHEST PORT 1 VIEW  Result Date: 06/04/2023 CLINICAL DATA:  Dyspnea. EXAM: PORTABLE CHEST 1 VIEW COMPARISON:  June 02, 2023. FINDINGS: Stable cardiomegaly. Stable large hiatal hernia. Both lungs are clear. The visualized skeletal structures are unremarkable. IMPRESSION: No active disease.  Stable large hiatal hernia. Electronically Signed   By: Lupita Raider M.D.   On: 06/04/2023 12:24   ECHOCARDIOGRAM COMPLETE  Result Date: 06/02/2023    ECHOCARDIOGRAM REPORT   Patient Name:   Donna Green Date of Exam: 06/02/2023 Medical Rec #:  [id number]      Height:       65.0 in Accession #:    [id number]     Weight:       166.7 lb Date of Birth:  01-01-1941      BSA:          1.831 m Patient Age:    82 years       BP:           158/74 mmHg Patient Gender: F              HR:           82 bpm. Exam Location:  Inpatient Procedure: 2D Echo, Cardiac Doppler, Color Doppler and Strain Analysis Indications:    Atrial Fibrillation I48.91  History:         Patient has no prior history of Echocardiogram examinations.                 Stroke; Risk Factors:Diabetes and Hypertension.  Sonographer:    Darlys Gales Referring Phys: [id number] DAVID MANUEL ORTIZ  Sonographer Comments: Global longitudinal strain was attempted. IMPRESSIONS  1. Difficult strain  MG tablet Commonly known as: ATIVAN Take 1 tablet (0.5 mg total) by mouth every 8 (eight) hours as needed for anxiety. What changed:  when to take this reasons to take this   NovoLOG FlexPen 100 UNIT/ML FlexPen Generic drug: insulin aspart Inject 8 Units into the skin 3 (three) times daily with meals.   polyethylene glycol 17 g packet Commonly known as: MIRALAX / GLYCOLAX Take 17 g by mouth daily. Start taking on: June 11, 2023   QUEtiapine 25 MG tablet Commonly known as: SEROQUEL Take 0.5 tablets (12.5 mg total) by mouth at bedtime.   rosuvastatin 20 MG tablet Commonly known as: CRESTOR Take 20 mg by mouth daily.         Contact information for follow-up providers     Sharlene Dory, PA-C Follow up.   Specialty: Cardiology Why: Tuesday Jun 25, 2023 Arrive by 8:10 AMAppt at 8:25 AM (25 min) Contact information: [address] [place] [address] [phone]              Contact information for after-discharge care     Destination     [facility] Preferred SNF .   Service: Skilled Nursing Contact information: [address] [place] [address] [phone]                    Allergies  Allergen Reactions   Codeine Other (See Comments)    Paranoid  Hallucinations    Dilaudid [Hydromorphone] Anxiety    Hallucinations, agitation, paranoia    Morphine Anxiety    Hallucinations, paranoia, agitation   Colcrys [Colchicine] Nausea Only   Zyloprim [Allopurinol] Nausea Only   Nsaids Other (See Comments)    Stage 3 kidney disease    Consultations: None   Procedures/Studies: DG CHEST PORT 1 VIEW  Result Date: 06/04/2023 CLINICAL DATA:  Dyspnea. EXAM: PORTABLE CHEST 1 VIEW COMPARISON:  June 02, 2023. FINDINGS: Stable cardiomegaly. Stable large hiatal hernia. Both lungs are clear. The visualized skeletal structures are unremarkable. IMPRESSION: No active disease.  Stable large hiatal hernia. Electronically Signed   By: Lupita Raider M.D.   On: 06/04/2023 12:24   ECHOCARDIOGRAM COMPLETE  Result Date: 06/02/2023    ECHOCARDIOGRAM REPORT   Patient Name:   Donna Green Date of Exam: 06/02/2023 Medical Rec #:  [id number]      Height:       65.0 in Accession #:    [id number]     Weight:       166.7 lb Date of Birth:  01-01-1941      BSA:          1.831 m Patient Age:    82 years       BP:           158/74 mmHg Patient Gender: F              HR:           82 bpm. Exam Location:  Inpatient Procedure: 2D Echo, Cardiac Doppler, Color Doppler and Strain Analysis Indications:    Atrial Fibrillation I48.91  History:         Patient has no prior history of Echocardiogram examinations.                 Stroke; Risk Factors:Diabetes and Hypertension.  Sonographer:    Darlys Gales Referring Phys: [id number] DAVID MANUEL ORTIZ  Sonographer Comments: Global longitudinal strain was attempted. IMPRESSIONS  1. Difficult strain  Physician Discharge Summary  [name]:[id number] DOB: [birth year] DOA: 06/01/2023  PCP: Renford Dills, MD  Admit date: 06/01/2023 Discharge date: 06/10/2023  Admitted From: Home Disposition: Skilled nursing facility  Recommendations for Outpatient Follow-up:  Follow up with PCP in 1-2 weeks Please obtain BMP/CBC/magnesium in one week   Discharge Condition: Fair CODE STATUS: Full code Diet recommendation: Low-salt and low-carb diet  Discharge summary: 82 y.o. 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  On presentation, her blood pressure was elevated to 209/108 and she was found to be in new onset A-fib with RVR.  Troponins were mildly elevated at 100, then 104, then 105, then 92.  Chest x-ray showed no acute cardiopulmonary process but showed a large hiatal hernia.  CT of the head with no acute intracranial abnormality.  CT cervical spine showed no acute fractures.  CT abdomen/pelvis showed mild L1 superior endplate central compression fracture with slight posterior bowing of the dorsal vertebral cortex but no significant anterior posterior height loss; large hiatal hernia with inverted intrathoracic stomach; diverticulosis without diverticulitis.  Left knee x-ray showed no fracture or effusion.  Left femur x-ray with no acute findings.  Cardiology consulted.  Palliative care consulted for goals of care discussion. Hospital course complicated by delirium and unable to walk. Delirium is improving.  She is still very weak and debilitated.  Difficult to walk.  Going to a SNF today.   Assessment & plan of care:   New onset paroxysmal A-fib with RVR -Presented with a brief rapid A-fib.  Seen by cardiology.  Patient remains in sinus rhythm now.   Echocardiogram with normal ejection fraction. Currently rate controlled on carvedilol, started on Eliquis and tolerating. Discharging with carvedilol 6.25 mg twice daily to avoid orthostatic hypotension.   Acute metabolic encephalopathy/hospital-acquired delirium. Thought to be multifactorial.  Multiple investigations negative. CT head negative.  Some response after stopping narcotics. Ammonia was normal. B12 was 196 and borderline low.  She was given 5 doses of injectable B12 injection in the hospital, will give her oral supplementation on discharge. Had developed intermittent delirium, responded well to Seroquel.  Will put patient on Seroquel 12.5 mg at bedtime.  Once her clinical status improves she can come off of this. All-time fall precautions and delirium precautions. Urinalysis was relatively normal. 10/22, urine culture was positive for more than 100,000 colonies of Klebsiella and diphtheroids.  Since patient was symptomatic, will treat with 1 dose of Rocephin today and continue 6 more days of Keflex by mouth.   Close compression fracture of L1 vertebra -Continue thoracolumbar corset.  Fall precautions.  PT evaluation.  Pain management.  Avoid narcotics.  Local therapies including heating pad, lidocaine, Tylenol.   Large hiatal hernia -Continue PPI.  Outpatient follow-up with general surgery   History of stroke with residual right-sided deficit Hyperlipidemia -Supportive care.  Outpatient follow-up with neurology.  Starting on Eliquis.  Discontinue Aggrenox.  Continue statin.    CKD stage IV -Baseline creatinine around 1.7-2.2.  Creatinine 2.36.  Will need close outpatient monitoring.   Hypertensive crisis in a patient with essential hypertension Blood pressures fluctuate.  Started on carvedilol for rate control.   Develops orthostatic dizziness.  Will discontinue all other antihypertensives.  Keep on Coreg.  With further improvement in mobility, she may need to go back  MG tablet Commonly known as: ATIVAN Take 1 tablet (0.5 mg total) by mouth every 8 (eight) hours as needed for anxiety. What changed:  when to take this reasons to take this   NovoLOG FlexPen 100 UNIT/ML FlexPen Generic drug: insulin aspart Inject 8 Units into the skin 3 (three) times daily with meals.   polyethylene glycol 17 g packet Commonly known as: MIRALAX / GLYCOLAX Take 17 g by mouth daily. Start taking on: June 11, 2023   QUEtiapine 25 MG tablet Commonly known as: SEROQUEL Take 0.5 tablets (12.5 mg total) by mouth at bedtime.   rosuvastatin 20 MG tablet Commonly known as: CRESTOR Take 20 mg by mouth daily.         Contact information for follow-up providers     Sharlene Dory, PA-C Follow up.   Specialty: Cardiology Why: Tuesday Jun 25, 2023 Arrive by 8:10 AMAppt at 8:25 AM (25 min) Contact information: [address] [place] [address] [phone]              Contact information for after-discharge care     Destination     [facility] Preferred SNF .   Service: Skilled Nursing Contact information: [address] [place] [address] [phone]                    Allergies  Allergen Reactions   Codeine Other (See Comments)    Paranoid  Hallucinations    Dilaudid [Hydromorphone] Anxiety    Hallucinations, agitation, paranoia    Morphine Anxiety    Hallucinations, paranoia, agitation   Colcrys [Colchicine] Nausea Only   Zyloprim [Allopurinol] Nausea Only   Nsaids Other (See Comments)    Stage 3 kidney disease    Consultations: None   Procedures/Studies: DG CHEST PORT 1 VIEW  Result Date: 06/04/2023 CLINICAL DATA:  Dyspnea. EXAM: PORTABLE CHEST 1 VIEW COMPARISON:  June 02, 2023. FINDINGS: Stable cardiomegaly. Stable large hiatal hernia. Both lungs are clear. The visualized skeletal structures are unremarkable. IMPRESSION: No active disease.  Stable large hiatal hernia. Electronically Signed   By: Lupita Raider M.D.   On: 06/04/2023 12:24   ECHOCARDIOGRAM COMPLETE  Result Date: 06/02/2023    ECHOCARDIOGRAM REPORT   Patient Name:   Donna Green Date of Exam: 06/02/2023 Medical Rec #:  [id number]      Height:       65.0 in Accession #:    [id number]     Weight:       166.7 lb Date of Birth:  01-01-1941      BSA:          1.831 m Patient Age:    82 years       BP:           158/74 mmHg Patient Gender: F              HR:           82 bpm. Exam Location:  Inpatient Procedure: 2D Echo, Cardiac Doppler, Color Doppler and Strain Analysis Indications:    Atrial Fibrillation I48.91  History:         Patient has no prior history of Echocardiogram examinations.                 Stroke; Risk Factors:Diabetes and Hypertension.  Sonographer:    Darlys Gales Referring Phys: [id number] DAVID MANUEL ORTIZ  Sonographer Comments: Global longitudinal strain was attempted. IMPRESSIONS  1. Difficult strain  MG tablet Commonly known as: ATIVAN Take 1 tablet (0.5 mg total) by mouth every 8 (eight) hours as needed for anxiety. What changed:  when to take this reasons to take this   NovoLOG FlexPen 100 UNIT/ML FlexPen Generic drug: insulin aspart Inject 8 Units into the skin 3 (three) times daily with meals.   polyethylene glycol 17 g packet Commonly known as: MIRALAX / GLYCOLAX Take 17 g by mouth daily. Start taking on: June 11, 2023   QUEtiapine 25 MG tablet Commonly known as: SEROQUEL Take 0.5 tablets (12.5 mg total) by mouth at bedtime.   rosuvastatin 20 MG tablet Commonly known as: CRESTOR Take 20 mg by mouth daily.         Contact information for follow-up providers     Sharlene Dory, PA-C Follow up.   Specialty: Cardiology Why: Tuesday Jun 25, 2023 Arrive by 8:10 AMAppt at 8:25 AM (25 min) Contact information: [address] [place] [address] [phone]              Contact information for after-discharge care     Destination     [facility] Preferred SNF .   Service: Skilled Nursing Contact information: [address] [place] [address] [phone]                    Allergies  Allergen Reactions   Codeine Other (See Comments)    Paranoid  Hallucinations    Dilaudid [Hydromorphone] Anxiety    Hallucinations, agitation, paranoia    Morphine Anxiety    Hallucinations, paranoia, agitation   Colcrys [Colchicine] Nausea Only   Zyloprim [Allopurinol] Nausea Only   Nsaids Other (See Comments)    Stage 3 kidney disease    Consultations: None   Procedures/Studies: DG CHEST PORT 1 VIEW  Result Date: 06/04/2023 CLINICAL DATA:  Dyspnea. EXAM: PORTABLE CHEST 1 VIEW COMPARISON:  June 02, 2023. FINDINGS: Stable cardiomegaly. Stable large hiatal hernia. Both lungs are clear. The visualized skeletal structures are unremarkable. IMPRESSION: No active disease.  Stable large hiatal hernia. Electronically Signed   By: Lupita Raider M.D.   On: 06/04/2023 12:24   ECHOCARDIOGRAM COMPLETE  Result Date: 06/02/2023    ECHOCARDIOGRAM REPORT   Patient Name:   Donna Green Date of Exam: 06/02/2023 Medical Rec #:  [id number]      Height:       65.0 in Accession #:    [id number]     Weight:       166.7 lb Date of Birth:  01-01-1941      BSA:          1.831 m Patient Age:    82 years       BP:           158/74 mmHg Patient Gender: F              HR:           82 bpm. Exam Location:  Inpatient Procedure: 2D Echo, Cardiac Doppler, Color Doppler and Strain Analysis Indications:    Atrial Fibrillation I48.91  History:         Patient has no prior history of Echocardiogram examinations.                 Stroke; Risk Factors:Diabetes and Hypertension.  Sonographer:    Darlys Gales Referring Phys: [id number] DAVID MANUEL ORTIZ  Sonographer Comments: Global longitudinal strain was attempted. IMPRESSIONS  1. Difficult strain  Physician Discharge Summary  [name]:[id number] DOB: [birth year] DOA: 06/01/2023  PCP: Renford Dills, MD  Admit date: 06/01/2023 Discharge date: 06/10/2023  Admitted From: Home Disposition: Skilled nursing facility  Recommendations for Outpatient Follow-up:  Follow up with PCP in 1-2 weeks Please obtain BMP/CBC/magnesium in one week   Discharge Condition: Fair CODE STATUS: Full code Diet recommendation: Low-salt and low-carb diet  Discharge summary: 82 y.o. 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  On presentation, her blood pressure was elevated to 209/108 and she was found to be in new onset A-fib with RVR.  Troponins were mildly elevated at 100, then 104, then 105, then 92.  Chest x-ray showed no acute cardiopulmonary process but showed a large hiatal hernia.  CT of the head with no acute intracranial abnormality.  CT cervical spine showed no acute fractures.  CT abdomen/pelvis showed mild L1 superior endplate central compression fracture with slight posterior bowing of the dorsal vertebral cortex but no significant anterior posterior height loss; large hiatal hernia with inverted intrathoracic stomach; diverticulosis without diverticulitis.  Left knee x-ray showed no fracture or effusion.  Left femur x-ray with no acute findings.  Cardiology consulted.  Palliative care consulted for goals of care discussion. Hospital course complicated by delirium and unable to walk. Delirium is improving.  She is still very weak and debilitated.  Difficult to walk.  Going to a SNF today.   Assessment & plan of care:   New onset paroxysmal A-fib with RVR -Presented with a brief rapid A-fib.  Seen by cardiology.  Patient remains in sinus rhythm now.   Echocardiogram with normal ejection fraction. Currently rate controlled on carvedilol, started on Eliquis and tolerating. Discharging with carvedilol 6.25 mg twice daily to avoid orthostatic hypotension.   Acute metabolic encephalopathy/hospital-acquired delirium. Thought to be multifactorial.  Multiple investigations negative. CT head negative.  Some response after stopping narcotics. Ammonia was normal. B12 was 196 and borderline low.  She was given 5 doses of injectable B12 injection in the hospital, will give her oral supplementation on discharge. Had developed intermittent delirium, responded well to Seroquel.  Will put patient on Seroquel 12.5 mg at bedtime.  Once her clinical status improves she can come off of this. All-time fall precautions and delirium precautions. Urinalysis was relatively normal. 10/22, urine culture was positive for more than 100,000 colonies of Klebsiella and diphtheroids.  Since patient was symptomatic, will treat with 1 dose of Rocephin today and continue 6 more days of Keflex by mouth.   Close compression fracture of L1 vertebra -Continue thoracolumbar corset.  Fall precautions.  PT evaluation.  Pain management.  Avoid narcotics.  Local therapies including heating pad, lidocaine, Tylenol.   Large hiatal hernia -Continue PPI.  Outpatient follow-up with general surgery   History of stroke with residual right-sided deficit Hyperlipidemia -Supportive care.  Outpatient follow-up with neurology.  Starting on Eliquis.  Discontinue Aggrenox.  Continue statin.    CKD stage IV -Baseline creatinine around 1.7-2.2.  Creatinine 2.36.  Will need close outpatient monitoring.   Hypertensive crisis in a patient with essential hypertension Blood pressures fluctuate.  Started on carvedilol for rate control.   Develops orthostatic dizziness.  Will discontinue all other antihypertensives.  Keep on Coreg.  With further improvement in mobility, she may need to go back

## 2023-06-10 NOTE — Plan of Care (Signed)
  Problem: Health Behavior/Discharge Planning: Goal: Ability to manage health-related needs will improve Outcome: Progressing   Problem: Elimination: Goal: Will not experience complications related to bowel motility Outcome: Progressing Goal: Will not experience complications related to urinary retention Outcome: Progressing   Problem: Safety: Goal: Ability to remain free from injury will improve Outcome: Progressing

## 2023-06-10 NOTE — Discharge Instructions (Signed)

## 2023-06-10 NOTE — TOC Transition Note (Addendum)
Transition of Care [facility]) - CM/SW Discharge Note   Patient Details  Name: Donna Green MRN: [id number] Date of Birth: 01-04-1941  [facility] CM/SW Contact:  Otelia Santee, LCSW Phone Number: 06/10/2023, 12:14 PM   Clinical Narrative:    Spoke with daughter and confirmed plan for pt to transfer to [facility] for SNF placement. Pt will be going to room 3243. RN to call report to [phone]. DC packet placed at RN station. [facility] to be called at 1pm for transportation.    Final next level of care: Skilled Nursing Facility Barriers to Discharge: Barriers Resolved   Patient Goals and CMS Choice CMS Medicare.gov Compare Post Acute Care list provided to:: Patient Represenative (must comment) Choice offered to / list presented to : Adult Children  Discharge Placement PASRR number recieved: 06/04/23 PASRR number recieved: 06/04/23            Patient chooses bed at: [facility] Patient to be transferred to facility by: [facility] Name of family member notified: Daphne Patient and family notified of of transfer: 06/10/23  Discharge Plan and Services Additional resources added to the After Visit Summary for   In-house Referral: Clinical Social Work   Post Acute Care Choice: Skilled Nursing Facility          DME Arranged: N/A DME Agency: NA                  Social Determinants of Health (SDOH) Interventions SDOH Screenings   Food Insecurity: No Food Insecurity (06/03/2023)  Housing: Low Risk  (06/03/2023)  Transportation Needs: No Transportation Needs (06/03/2023)  Utilities: Not At Risk (06/03/2023)  Depression (PHQ2-9): Low Risk  (10/13/2022)  Tobacco Use: Low Risk  (06/01/2023)     Readmission Risk Interventions    06/10/2023   12:13 PM 03/26/2022    2:08 PM  Readmission Risk Prevention Plan  Transportation Screening Complete Complete  PCP or Specialist Appt within 5-7 Days Complete Complete  Home Care Screening Complete Complete   Medication Review (RN CM) Complete Complete

## 2023-06-11 DIAGNOSIS — N189 Chronic kidney disease, unspecified: Secondary | ICD-10-CM | POA: Diagnosis not present

## 2023-06-11 DIAGNOSIS — I4891 Unspecified atrial fibrillation: Secondary | ICD-10-CM | POA: Diagnosis not present

## 2023-06-11 DIAGNOSIS — E1159 Type 2 diabetes mellitus with other circulatory complications: Secondary | ICD-10-CM | POA: Diagnosis not present

## 2023-06-11 DIAGNOSIS — S32010D Wedge compression fracture of first lumbar vertebra, subsequent encounter for fracture with routine healing: Secondary | ICD-10-CM | POA: Diagnosis not present

## 2023-06-11 DIAGNOSIS — E875 Hyperkalemia: Secondary | ICD-10-CM | POA: Diagnosis not present

## 2023-06-12 DIAGNOSIS — E875 Hyperkalemia: Secondary | ICD-10-CM | POA: Diagnosis not present

## 2023-06-12 DIAGNOSIS — N189 Chronic kidney disease, unspecified: Secondary | ICD-10-CM | POA: Diagnosis not present

## 2023-06-12 DIAGNOSIS — E1159 Type 2 diabetes mellitus with other circulatory complications: Secondary | ICD-10-CM | POA: Diagnosis not present

## 2023-06-12 DIAGNOSIS — S32010D Wedge compression fracture of first lumbar vertebra, subsequent encounter for fracture with routine healing: Secondary | ICD-10-CM | POA: Diagnosis not present

## 2023-06-12 DIAGNOSIS — I4891 Unspecified atrial fibrillation: Secondary | ICD-10-CM | POA: Diagnosis not present

## 2023-06-13 DIAGNOSIS — I4891 Unspecified atrial fibrillation: Secondary | ICD-10-CM | POA: Diagnosis not present

## 2023-06-13 DIAGNOSIS — E875 Hyperkalemia: Secondary | ICD-10-CM | POA: Diagnosis not present

## 2023-06-13 DIAGNOSIS — N189 Chronic kidney disease, unspecified: Secondary | ICD-10-CM | POA: Diagnosis not present

## 2023-06-13 DIAGNOSIS — S32010D Wedge compression fracture of first lumbar vertebra, subsequent encounter for fracture with routine healing: Secondary | ICD-10-CM | POA: Diagnosis not present

## 2023-06-13 DIAGNOSIS — E1159 Type 2 diabetes mellitus with other circulatory complications: Secondary | ICD-10-CM | POA: Diagnosis not present

## 2023-06-14 DIAGNOSIS — I4891 Unspecified atrial fibrillation: Secondary | ICD-10-CM | POA: Diagnosis not present

## 2023-06-16 DIAGNOSIS — I69351 Hemiplegia and hemiparesis following cerebral infarction affecting right dominant side: Secondary | ICD-10-CM | POA: Diagnosis not present

## 2023-06-16 DIAGNOSIS — N184 Chronic kidney disease, stage 4 (severe): Secondary | ICD-10-CM | POA: Diagnosis not present

## 2023-06-16 DIAGNOSIS — I129 Hypertensive chronic kidney disease with stage 1 through stage 4 chronic kidney disease, or unspecified chronic kidney disease: Secondary | ICD-10-CM | POA: Diagnosis not present

## 2023-06-16 DIAGNOSIS — E1122 Type 2 diabetes mellitus with diabetic chronic kidney disease: Secondary | ICD-10-CM | POA: Diagnosis not present

## 2023-06-16 DIAGNOSIS — E1165 Type 2 diabetes mellitus with hyperglycemia: Secondary | ICD-10-CM | POA: Diagnosis not present

## 2023-06-16 DIAGNOSIS — S32010D Wedge compression fracture of first lumbar vertebra, subsequent encounter for fracture with routine healing: Secondary | ICD-10-CM | POA: Diagnosis not present

## 2023-06-16 DIAGNOSIS — N390 Urinary tract infection, site not specified: Secondary | ICD-10-CM | POA: Diagnosis not present

## 2023-06-16 DIAGNOSIS — B961 Klebsiella pneumoniae [K. pneumoniae] as the cause of diseases classified elsewhere: Secondary | ICD-10-CM | POA: Diagnosis not present

## 2023-06-16 DIAGNOSIS — E1159 Type 2 diabetes mellitus with other circulatory complications: Secondary | ICD-10-CM | POA: Diagnosis not present

## 2023-06-20 DIAGNOSIS — S32010D Wedge compression fracture of first lumbar vertebra, subsequent encounter for fracture with routine healing: Secondary | ICD-10-CM | POA: Diagnosis not present

## 2023-06-20 DIAGNOSIS — N390 Urinary tract infection, site not specified: Secondary | ICD-10-CM | POA: Diagnosis not present

## 2023-06-20 DIAGNOSIS — E1122 Type 2 diabetes mellitus with diabetic chronic kidney disease: Secondary | ICD-10-CM | POA: Diagnosis not present

## 2023-06-20 DIAGNOSIS — I69351 Hemiplegia and hemiparesis following cerebral infarction affecting right dominant side: Secondary | ICD-10-CM | POA: Diagnosis not present

## 2023-06-20 DIAGNOSIS — N184 Chronic kidney disease, stage 4 (severe): Secondary | ICD-10-CM | POA: Diagnosis not present

## 2023-06-20 DIAGNOSIS — I129 Hypertensive chronic kidney disease with stage 1 through stage 4 chronic kidney disease, or unspecified chronic kidney disease: Secondary | ICD-10-CM | POA: Diagnosis not present

## 2023-06-20 DIAGNOSIS — B961 Klebsiella pneumoniae [K. pneumoniae] as the cause of diseases classified elsewhere: Secondary | ICD-10-CM | POA: Diagnosis not present

## 2023-06-20 DIAGNOSIS — E1165 Type 2 diabetes mellitus with hyperglycemia: Secondary | ICD-10-CM | POA: Diagnosis not present

## 2023-06-20 DIAGNOSIS — E1159 Type 2 diabetes mellitus with other circulatory complications: Secondary | ICD-10-CM | POA: Diagnosis not present

## 2023-06-22 DIAGNOSIS — B961 Klebsiella pneumoniae [K. pneumoniae] as the cause of diseases classified elsewhere: Secondary | ICD-10-CM | POA: Diagnosis not present

## 2023-06-22 DIAGNOSIS — S32010D Wedge compression fracture of first lumbar vertebra, subsequent encounter for fracture with routine healing: Secondary | ICD-10-CM | POA: Diagnosis not present

## 2023-06-22 DIAGNOSIS — E1159 Type 2 diabetes mellitus with other circulatory complications: Secondary | ICD-10-CM | POA: Diagnosis not present

## 2023-06-22 DIAGNOSIS — I129 Hypertensive chronic kidney disease with stage 1 through stage 4 chronic kidney disease, or unspecified chronic kidney disease: Secondary | ICD-10-CM | POA: Diagnosis not present

## 2023-06-22 DIAGNOSIS — E1165 Type 2 diabetes mellitus with hyperglycemia: Secondary | ICD-10-CM | POA: Diagnosis not present

## 2023-06-22 DIAGNOSIS — N184 Chronic kidney disease, stage 4 (severe): Secondary | ICD-10-CM | POA: Diagnosis not present

## 2023-06-22 DIAGNOSIS — N390 Urinary tract infection, site not specified: Secondary | ICD-10-CM | POA: Diagnosis not present

## 2023-06-22 DIAGNOSIS — E1122 Type 2 diabetes mellitus with diabetic chronic kidney disease: Secondary | ICD-10-CM | POA: Diagnosis not present

## 2023-06-22 DIAGNOSIS — I69351 Hemiplegia and hemiparesis following cerebral infarction affecting right dominant side: Secondary | ICD-10-CM | POA: Diagnosis not present

## 2023-06-24 ENCOUNTER — Emergency Department (HOSPITAL_COMMUNITY): Payer: Medicare PPO

## 2023-06-24 ENCOUNTER — Encounter (HOSPITAL_COMMUNITY): Payer: Self-pay | Admitting: Internal Medicine

## 2023-06-24 ENCOUNTER — Observation Stay (HOSPITAL_COMMUNITY)
Admission: EM | Admit: 2023-06-24 | Discharge: 2023-06-26 | Disposition: A | Discharge status: Home / Self Care | Payer: Medicare PPO | Attending: Family Medicine | Admitting: Family Medicine

## 2023-06-24 ENCOUNTER — Other Ambulatory Visit: Payer: Self-pay

## 2023-06-24 DIAGNOSIS — Z7901 Long term (current) use of anticoagulants: Secondary | ICD-10-CM | POA: Diagnosis not present

## 2023-06-24 DIAGNOSIS — R410 Disorientation, unspecified: Secondary | ICD-10-CM | POA: Insufficient documentation

## 2023-06-24 DIAGNOSIS — I5032 Chronic diastolic (congestive) heart failure: Secondary | ICD-10-CM | POA: Insufficient documentation

## 2023-06-24 DIAGNOSIS — R29810 Facial weakness: Secondary | ICD-10-CM

## 2023-06-24 DIAGNOSIS — R41841 Cognitive communication deficit: Secondary | ICD-10-CM | POA: Insufficient documentation

## 2023-06-24 DIAGNOSIS — E1122 Type 2 diabetes mellitus with diabetic chronic kidney disease: Secondary | ICD-10-CM | POA: Insufficient documentation

## 2023-06-24 DIAGNOSIS — I130 Hypertensive heart and chronic kidney disease with heart failure and stage 1 through stage 4 chronic kidney disease, or unspecified chronic kidney disease: Secondary | ICD-10-CM | POA: Diagnosis not present

## 2023-06-24 DIAGNOSIS — E7849 Other hyperlipidemia: Secondary | ICD-10-CM

## 2023-06-24 DIAGNOSIS — E785 Hyperlipidemia, unspecified: Secondary | ICD-10-CM | POA: Diagnosis present

## 2023-06-24 DIAGNOSIS — N1832 Chronic kidney disease, stage 3b: Secondary | ICD-10-CM | POA: Insufficient documentation

## 2023-06-24 DIAGNOSIS — Z794 Long term (current) use of insulin: Secondary | ICD-10-CM | POA: Insufficient documentation

## 2023-06-24 DIAGNOSIS — Z8673 Personal history of transient ischemic attack (TIA), and cerebral infarction without residual deficits: Secondary | ICD-10-CM | POA: Insufficient documentation

## 2023-06-24 DIAGNOSIS — D472 Monoclonal gammopathy: Secondary | ICD-10-CM

## 2023-06-24 DIAGNOSIS — F411 Generalized anxiety disorder: Secondary | ICD-10-CM | POA: Insufficient documentation

## 2023-06-24 DIAGNOSIS — I6782 Cerebral ischemia: Secondary | ICD-10-CM | POA: Diagnosis not present

## 2023-06-24 DIAGNOSIS — W19XXXA Unspecified fall, initial encounter: Secondary | ICD-10-CM | POA: Diagnosis not present

## 2023-06-24 DIAGNOSIS — I480 Paroxysmal atrial fibrillation: Secondary | ICD-10-CM | POA: Diagnosis not present

## 2023-06-24 DIAGNOSIS — Z79899 Other long term (current) drug therapy: Secondary | ICD-10-CM | POA: Insufficient documentation

## 2023-06-24 DIAGNOSIS — K449 Diaphragmatic hernia without obstruction or gangrene: Secondary | ICD-10-CM

## 2023-06-24 DIAGNOSIS — G9341 Metabolic encephalopathy: Secondary | ICD-10-CM | POA: Diagnosis not present

## 2023-06-24 DIAGNOSIS — R000 Tachycardia, unspecified: Secondary | ICD-10-CM | POA: Diagnosis not present

## 2023-06-24 DIAGNOSIS — I639 Cerebral infarction, unspecified: Secondary | ICD-10-CM | POA: Diagnosis not present

## 2023-06-24 DIAGNOSIS — I161 Hypertensive emergency: Secondary | ICD-10-CM | POA: Diagnosis not present

## 2023-06-24 DIAGNOSIS — R29818 Other symptoms and signs involving the nervous system: Secondary | ICD-10-CM | POA: Diagnosis present

## 2023-06-24 DIAGNOSIS — I10 Essential (primary) hypertension: Secondary | ICD-10-CM | POA: Diagnosis not present

## 2023-06-24 LAB — DIFFERENTIAL
Abs Immature Granulocytes: 0.04 10*3/uL (ref 0.00–0.07)
Basophils Absolute: 0 10*3/uL (ref 0.0–0.1)
Basophils Relative: 0 %
Eosinophils Absolute: 0.1 10*3/uL (ref 0.0–0.5)
Eosinophils Relative: 1 %
Immature Granulocytes: 0 %
Lymphocytes Relative: 25 %
Lymphs Abs: 2.2 10*3/uL (ref 0.7–4.0)
Monocytes Absolute: 1.2 10*3/uL — ABNORMAL HIGH (ref 0.1–1.0)
Monocytes Relative: 13 %
Neutro Abs: 5.4 10*3/uL (ref 1.7–7.7)
Neutrophils Relative %: 61 %

## 2023-06-24 LAB — COMPREHENSIVE METABOLIC PANEL
ALT: 14 U/L (ref 0–44)
AST: 21 U/L (ref 15–41)
Albumin: 2.4 g/dL — ABNORMAL LOW (ref 3.5–5.0)
Alkaline Phosphatase: 77 U/L (ref 38–126)
Anion gap: 9 (ref 5–15)
BUN: 14 mg/dL (ref 8–23)
CO2: 24 mmol/L (ref 22–32)
Calcium: 8.6 mg/dL — ABNORMAL LOW (ref 8.9–10.3)
Chloride: 103 mmol/L (ref 98–111)
Creatinine, Ser: 1.68 mg/dL — ABNORMAL HIGH (ref 0.44–1.00)
GFR, Estimated: 30 mL/min — ABNORMAL LOW (ref >60)
Glucose, Bld: 136 mg/dL — ABNORMAL HIGH (ref 70–99)
Potassium: 3.5 mmol/L (ref 3.5–5.1)
Sodium: 136 mmol/L (ref 135–145)
Total Bilirubin: 0.6 mg/dL (ref <1.2)
Total Protein: 7.3 g/dL (ref 6.5–8.1)

## 2023-06-24 LAB — I-STAT CHEM 8, ED
BUN: 16 mg/dL (ref 8–23)
Calcium, Ion: 1.03 mmol/L — ABNORMAL LOW (ref 1.15–1.40)
Chloride: 103 mmol/L (ref 98–111)
Creatinine, Ser: 1.7 mg/dL — ABNORMAL HIGH (ref 0.44–1.00)
Glucose, Bld: 130 mg/dL — ABNORMAL HIGH (ref 70–99)
HCT: 35 % — ABNORMAL LOW (ref 36.0–46.0)
Hemoglobin: 11.9 g/dL — ABNORMAL LOW (ref 12.0–15.0)
Potassium: 3.6 mmol/L (ref 3.5–5.1)
Sodium: 140 mmol/L (ref 135–145)
TCO2: 25 mmol/L (ref 22–32)

## 2023-06-24 LAB — CBC
HCT: 33.7 % — ABNORMAL LOW (ref 36.0–46.0)
Hemoglobin: 10.5 g/dL — ABNORMAL LOW (ref 12.0–15.0)
MCH: 32.4 pg (ref 26.0–34.0)
MCHC: 31.2 g/dL (ref 30.0–36.0)
MCV: 104 fL — ABNORMAL HIGH (ref 80.0–100.0)
Platelets: 320 10*3/uL (ref 150–400)
RBC: 3.24 MIL/uL — ABNORMAL LOW (ref 3.87–5.11)
RDW: 13.5 % (ref 11.5–15.5)
WBC: 8.9 10*3/uL (ref 4.0–10.5)
nRBC: 0 % (ref 0.0–0.2)

## 2023-06-24 LAB — PROTIME-INR
INR: 1.4 — ABNORMAL HIGH (ref 0.8–1.2)
Prothrombin Time: 17.7 s — ABNORMAL HIGH (ref 11.4–15.2)

## 2023-06-24 LAB — ETHANOL: Alcohol, Ethyl (B): <10 mg/dL (ref <10)

## 2023-06-24 LAB — APTT: aPTT: 42 s — ABNORMAL HIGH (ref 24–36)

## 2023-06-24 MED ORDER — HYDRALAZINE HCL 20 MG/ML IJ SOLN
10.0000 mg | Freq: Three times a day (TID) | INTRAMUSCULAR | Status: DC | PRN
  Filled 2023-06-24: qty 1

## 2023-06-24 MED ORDER — POLYETHYLENE GLYCOL 3350 17 G PO PACK
17.0000 g | PACK | Freq: Every day | ORAL | Status: DC
  Administered 2023-06-26: 17 g via ORAL
  Filled 2023-06-24 (×2): qty 1

## 2023-06-24 MED ORDER — LORAZEPAM 0.5 MG PO TABS
0.5000 mg | ORAL_TABLET | Freq: Three times a day (TID) | ORAL | Status: DC | PRN
  Administered 2023-06-25: 0.5 mg via ORAL
  Filled 2023-06-24: qty 1

## 2023-06-24 MED ORDER — SODIUM CHLORIDE 0.9 % IV SOLN: 250.0000 mL | INTRAVENOUS | Status: AC | PRN

## 2023-06-24 MED ORDER — ACETAMINOPHEN 650 MG RE SUPP: 650.0000 mg | Freq: Four times a day (QID) | RECTAL | Status: DC | PRN

## 2023-06-24 MED ORDER — LABETALOL HCL 5 MG/ML IV SOLN: 10.0000 mg | Freq: Once | INTRAVENOUS | Status: DC

## 2023-06-24 MED ORDER — ROSUVASTATIN CALCIUM 20 MG PO TABS
20.0000 mg | ORAL_TABLET | Freq: Every day | ORAL | Status: DC
  Administered 2023-06-25 (×2): 20 mg via ORAL
  Filled 2023-06-24 (×2): qty 1

## 2023-06-24 MED ORDER — INSULIN ASPART 100 UNIT/ML IJ SOLN
0.0000 [IU] | Freq: Three times a day (TID) | INTRAMUSCULAR | Status: DC
  Administered 2023-06-26: 1 [IU] via SUBCUTANEOUS

## 2023-06-24 MED ORDER — BISACODYL 5 MG PO TBEC: 5.0000 mg | DELAYED_RELEASE_TABLET | Freq: Every day | ORAL | Status: DC | PRN

## 2023-06-24 MED ORDER — CARVEDILOL 3.125 MG PO TABS
6.2500 mg | ORAL_TABLET | Freq: Two times a day (BID) | ORAL | Status: DC
Start: 2023-06-25 — End: 2023-06-25

## 2023-06-24 MED ORDER — LABETALOL HCL 5 MG/ML IV SOLN
10.0000 mg | Freq: Once | INTRAVENOUS | Status: AC
  Administered 2023-06-24: 10 mg via INTRAVENOUS
  Filled 2023-06-24: qty 4

## 2023-06-24 MED ORDER — HYDRALAZINE HCL 20 MG/ML IJ SOLN: 10.0000 mg | Freq: Four times a day (QID) | INTRAMUSCULAR | Status: DC | PRN

## 2023-06-24 MED ORDER — HYDRALAZINE HCL 20 MG/ML IJ SOLN
10.0000 mg | Freq: Once | INTRAMUSCULAR | Status: AC
Start: 2023-06-24 — End: 2023-06-24
  Administered 2023-06-24: 10 mg via INTRAVENOUS
  Filled 2023-06-24: qty 1

## 2023-06-24 MED ORDER — INSULIN ASPART 100 UNIT/ML IJ SOLN
8.0000 [IU] | Freq: Three times a day (TID) | INTRAMUSCULAR | Status: DC
  Administered 2023-06-25 – 2023-06-26 (×6): 8 [IU] via SUBCUTANEOUS

## 2023-06-24 MED ORDER — SODIUM CHLORIDE 0.9% FLUSH: 3.0000 mL | INTRAVENOUS | Status: DC | PRN

## 2023-06-24 MED ORDER — SODIUM CHLORIDE 0.9% FLUSH
3.0000 mL | Freq: Two times a day (BID) | INTRAVENOUS | Status: DC
  Administered 2023-06-25 – 2023-06-26 (×4): 3 mL via INTRAVENOUS

## 2023-06-24 MED ORDER — ACETAMINOPHEN 325 MG PO TABS: 650.0000 mg | ORAL_TABLET | Freq: Four times a day (QID) | ORAL | Status: DC | PRN

## 2023-06-24 MED ORDER — INSULIN GLARGINE-YFGN 100 UNIT/ML ~~LOC~~ SOLN
10.0000 [IU] | Freq: Every day | SUBCUTANEOUS | Status: DC
  Administered 2023-06-25 – 2023-06-26 (×2): 10 [IU] via SUBCUTANEOUS
  Filled 2023-06-24 (×3): qty 0.1

## 2023-06-24 NOTE — H&P (Signed)
History and Physical    Donna Green ZOX:[id number] DOB: [birth year] DOA: 06/24/2023  PCP: Renford Dills, MD   Patient coming from: Home   Chief Complaint:  Chief Complaint  Patient presents with   Code Stroke   ED TRIAGE note:  HPI:  Donna Green is a 82 y.o. female with medical history significant of CKD stage 3b, essential hypertension, recent diagnosis of atrial fibrillation RVR on Eliquis,, hyperlipidemia, DM type II, generalized anxiety disorder, large hiatal hernia, history of CVA with residual right-sided weakness, closed compression fracture of the lumbar vertebr and chronic headache presented to emergency department by EMS for code stroke.  EMS reported patient has a facial droop and difficulty speaking as well as confusion.  Reported patient is hour and appropriately normal but has a facial droop that happened around 8:15 PM 06/24/2023. Per chart review patient's daughter reported that last known well at 8:15 PM when she gave her food and when she went to check back on the patient found to have slurred speech and right-sided facial droop.  Patient also found confused.  EMS found blood pressure elevated 230 on presentation.  Reported Eliquis last dose today.    Patient denies any headache, blurry vision, chest pain, palpitation, shortness of breath, abdominal pain, nausea, constipation diarrhea.  However patient is a poor historian and not very reliable.  History obtained speaking with patient's daughter and per chart review of ED physician and neurology note.  In the ED patient has been evaluated by neurology.  Per neurology evaluation patient either had hypertensive emergency versus toxic metabolic encephalopathy versus stroke versus TIA.  Recommended obtain MRI and pursue full stroke workup.  Recommended to check UA.  If patient has UTI or pneumonia need to treat.  If MRI does not show any stroke recommended to treat for hypertensive emergency and decrease blood pressure down  20% each day and recommended to keep blood pressure below 180 at this point.   ED Course:  At presentation to ED blood pressure was 255/105 and heart rate 100.  Blood pressure has been improved to 177/89. CMP showing blood glucose 136, creatinine 1.6 renal function improved as compared to 2 weeks ago and at baseline. CBC stable H&H.  No leukocytosis.  Normal platelet count.  Elevated APTT pro time INR.  Blood alcohol of less than 10.  Pending UA and UDS.  EKG showing sinus tachycardia heart rate 101 with premature ventricular complex. CT head no acute intracranial abnormality.  Age-related cerebral atrophy with moderate chronic microvascular ischemic disease.  MRI brain normal.  EDP Dr. Rubin Payor reported that neurology said if MRI shows any evidence of a stroke need to admitted for workup for stroke.  However if it does not showed stroke no need to pursue stroke workup at this moment.  Either way recommended to continue control blood pressure less than 180 now and decrease down 20% each day.  If MRI negative recommended consider EEG.  In the ED patient received hydralazine 10 mg and labetalol 10 mg.  Blood pressure has been improved to 177/89.  Hospitalist has been contacted for further management of hypertensive urgency, and follow-up with MRI.  Review of Systems:  ROS  Past Medical History:  Diagnosis Date   Anxiety 03/23/2022   Chronic kidney disease    Diabetes mellitus without complication (HCC)    Headache    Hyperlipidemia    Hypertension    Right hemiparesis (HCC)    Small bowel obstruction (HCC)    Stroke (HCC)  x2    Past Surgical History:  Procedure Laterality Date   BLADDER SURGERY     BOWEL RESECTION     EYE SURGERY     TUBAL LIGATION       reports that she has never smoked. She has never used smokeless tobacco. She reports that she does not drink alcohol and does not use drugs.  Allergies  Allergen Reactions   Codeine Other (See Comments)    Paranoid   Hallucinations    Dilaudid [Hydromorphone] Anxiety    Hallucinations, agitation, paranoia    Morphine Anxiety    Hallucinations, paranoia, agitation   Colcrys [Colchicine] Nausea Only   Zyloprim [Allopurinol] Nausea Only   Nsaids Other (See Comments)    Stage 3 kidney disease    Family History  Problem Relation Age of Onset   Diabetes Mother    Heart disease Mother    Parkinsonism Father    Diabetes Sister    Diabetes Brother    Cancer Brother    Diabetes Maternal Grandmother     Prior to Admission medications   Medication Sig Start Date End Date Taking? Authorizing Provider  acetaminophen (TYLENOL) 325 MG tablet Take 650 mg by mouth 2 (two) times daily as needed for mild pain (pain score 1-3) or moderate pain (pain score 4-6).   Yes [provider]  apixaban (ELIQUIS) 2.5 MG TABS tablet Take 1 tablet (2.5 mg total) by mouth 2 (two) times daily. 06/10/23  Yes Dorcas Carrow, MD  calcium carbonate (TUMS EX) 750 MG chewable tablet Chew 1 tablet by mouth daily as needed for heartburn.   Yes [provider]  carvedilol (COREG) 6.25 MG tablet Take 1 tablet (6.25 mg total) by mouth 2 (two) times daily with a meal. 06/10/23 07/10/23 Yes Ghimire, Lyndel Safe, MD  insulin aspart (NOVOLOG FLEXPEN) 100 UNIT/ML FlexPen Inject 8 Units into the skin 3 (three) times daily with meals.   Yes [provider]  insulin glargine (LANTUS) 100 UNIT/ML injection Inject 20 Units into the skin at bedtime.   Yes [provider]  LORazepam (ATIVAN) 0.5 MG tablet Take 1 tablet (0.5 mg total) by mouth every 8 (eight) hours as needed for anxiety. Patient taking differently: Take 0.5 mg by mouth in the morning and at bedtime. 06/10/23  Yes Ghimire, Lyndel Safe, MD  polyethylene glycol (MIRALAX / GLYCOLAX) 17 g packet Take 17 g by mouth daily. Patient taking differently: Take 17 g by mouth daily as needed for mild constipation or moderate constipation. 06/11/23  Yes Dorcas Carrow, MD   rosuvastatin (CRESTOR) 20 MG tablet Take 20 mg by mouth at bedtime.   Yes [provider]     Physical Exam: Vitals:   06/24/23 2122 06/24/23 2127 06/24/23 2215  BP:  (!) 205/105 (!) 177/89  Pulse:  100 (!) 105  Resp:  18 18  Temp: 99.6 F (37.6 C)    TempSrc: Oral    SpO2:  100% 100%    Physical Exam   Labs on Admission: I have personally reviewed following labs and imaging studies  CBC: Recent Labs  Lab 06/24/23 2109 06/24/23 2112  WBC 8.9  --   NEUTROABS 5.4  --   HGB 10.5* 11.9*  HCT 33.7* 35.0*  MCV 104.0*  --   PLT 320  --    Basic Metabolic Panel: Recent Labs  Lab 06/24/23 2109 06/24/23 2112  NA 136 140  K 3.5 3.6  CL 103 103  CO2 24  --   GLUCOSE  136* 130*  BUN 14 16  CREATININE 1.68* 1.70*  CALCIUM 8.6*  --    GFR: CrCl cannot be calculated (Unknown ideal weight.). Liver Function Tests: Recent Labs  Lab 06/24/23 2109  AST 21  ALT 14  ALKPHOS 77  BILITOT 0.6  PROT 7.3  ALBUMIN 2.4*   No results for input(s): "LIPASE", "AMYLASE" in the last 168 hours. No results for input(s): "AMMONIA" in the last 168 hours. Coagulation Profile: Recent Labs  Lab 06/24/23 2109  INR 1.4*   Cardiac Enzymes: No results for input(s): "CKTOTAL", "CKMB", "CKMBINDEX", "TROPONINI", "TROPONINIHS" in the last 168 hours. BNP (last 3 results) No results for input(s): "BNP" in the last 8760 hours. HbA1C: No results for input(s): "HGBA1C" in the last 72 hours. CBG: No results for input(s): "GLUCAP" in the last 168 hours. Lipid Profile: No results for input(s): "CHOL", "HDL", "LDLCALC", "TRIG", "CHOLHDL", "LDLDIRECT" in the last 72 hours. Thyroid Function Tests: No results for input(s): "TSH", "T4TOTAL", "FREET4", "T3FREE", "THYROIDAB" in the last 72 hours. Anemia Panel: No results for input(s): "VITAMINB12", "FOLATE", "FERRITIN", "TIBC", "IRON", "RETICCTPCT" in the last 72 hours. Urine analysis:    Component Value Date/Time   COLORURINE YELLOW  06/04/2023 1244   APPEARANCEUR CLEAR 06/04/2023 1244   LABSPEC 1.019 06/04/2023 1244   PHURINE 5.0 06/04/2023 1244   GLUCOSEU 50 (A) 06/04/2023 1244   HGBUR MODERATE (A) 06/04/2023 1244   BILIRUBINUR NEGATIVE 06/04/2023 1244   KETONESUR NEGATIVE 06/04/2023 1244   PROTEINUR >=300 (A) 06/04/2023 1244   UROBILINOGEN 1.0 04/30/2010 1504   NITRITE NEGATIVE 06/04/2023 1244   LEUKOCYTESUR SMALL (A) 06/04/2023 1244    Radiological Exams on Admission: I have personally reviewed images CT HEAD CODE STROKE WO CONTRAST  Result Date: 06/24/2023 CLINICAL DATA:  Code stroke. Initial evaluation for altered mental status, right facial droop. EXAM: CT HEAD WITHOUT CONTRAST TECHNIQUE: Contiguous axial images were obtained from the base of the skull through the vertex without intravenous contrast. RADIATION DOSE REDUCTION: This exam was performed according to the departmental dose-optimization program which includes automated exposure control, adjustment of the mA and/or kV according to patient size and/or use of iterative reconstruction technique. COMPARISON:  Prior study from 06/02/2023. FINDINGS: Brain: Age-related cerebral atrophy with moderately advanced chronic microvascular ischemic disease. Remote lacunar infarct at the posterior limb of the right internal capsule. No acute intracranial hemorrhage. No acute large vessel territory infarct. No mass lesion or midline shift. No hydrocephalus or extra-axial fluid collection. Vascular: No abnormal hyperdense vessel. Calcified atherosclerosis present at the skull base. Skull: Scalp soft tissues and calvarium within normal limits. Sinuses/Orbits: Globes and orbital soft tissues within normal limits. Paranasal sinuses are largely clear. Few small osteomas noted about the left ethmoidal air cells. Mastoid air cells are clear. Other: None. ASPECTS [facility] Stroke Program Early CT Score) - Ganglionic level infarction (caudate, lentiform nuclei, internal capsule, insula,  M1-M3 cortex): 7 - Supraganglionic infarction (M4-M6 cortex): 3 Total score (0-10 with 10 being normal): 10 IMPRESSION: 1. No acute intracranial abnormality. 2. ASPECTS is 10. 3. Age-related cerebral atrophy with moderate chronic microvascular ischemic disease. These results were communicated to Dr. Wilford Corner at 9:21 pm on 06/24/2023 by text page via the [facility] messaging system. Electronically Signed   By: Rise Mu M.D.   On: 06/24/2023 21:24    EKG: My personal interpretation of EKG shows: Sinus tachycardia heart rate 101.  Premature ventricular complex.  There is no ST and T wave abnormality    Assessment/Plan: Principal Problem:   Acute  metabolic encephalopathy Active Problems:   Essential hypertension   Paroxysmal atrial fibrillation (HCC)   Hypertensive emergency   Facial droop-resolved   Confusion   CKD stage 3b, GFR 30-44 ml/min (HCC)   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   Hyperlipidemia   Large hiatal hernia   GAD (generalized anxiety disorder)    Assessment and Plan: Acute metabolic encephalopathy-resolved (secondary to hypertensive emergency) Facial droop-resolved Confusion-resolved > Patient brought to ED by EMS for code stroke.  Daughter reported patient developed facial droop, difficulty speaking and confusion around 8:15 PM 06/24/2023. - At presentation to ED facial droop, confusion and speech difficulty has been resolved.  However patient has baseline facial tremor and delayed speech.   -Head CT ruled out stroke - MRI pending.  Holding Eliquis until MRI results will be available - Neurology has been consulted recommended if MRI shows any evidence of a stroke in that case need to pursue further stroke workup otherwise if MRI is normal patient will need an EEG.  Regardless recommended to control blood pressure keep the systolic blood pressure below 161 and reduced 20% every 24 hour -Normal ammonia.  Blood alcohol level less  than 10.  CBC and CMP unremarkable.  Pending UA and UDS. -Continue to check every 4 hours - Continue aspiration precaution, fall precaution, and keep head of the bed bed elevated above 30 degree angle - Will do bedside swallow screen before starting oral diet and medications.  Consulting speech for evaluation. . -Continue cardiac monitoring.  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 Hypertensive emergency -Patient presenting with complaint of facial droop and confusion.  At presentation to ED blood pressure 205/105.  Heart rate 105.  EKG showed sinus tachycardia 101 and premature ventricular complex. -Given patient has confusion/acute metabolic encephalopathy patient meets criteria for hypertensive emergency. -Patient is euvolemic on physical exam.  No evidence of JVD and lower extremities edema. -In the ED patient received labetalol 10 mg and hydralazine 10 mg as well.  Blood pressure has been improved to upper 170s range from 200/ -Continue hydralazine as needed - Continue Coreg twice daily. -Per chart review patient is recently has been admitted in October 2024 with similar picture and found to have new onset of A-fib RVR.  Currently patient is on Coreg 6.25 mg twice daily.  -Plan to decrease blood pressure 20% every 24 hours. - Continue hydralazine as needed 10 mg every 8 as needed for systolic blood pressure above 096 or diastolic blood pressure above 045. -Continue cardiac monitoring. -Deferring echocardiogram at this time as patient just have an echocardiogram on 06/02/2023 which showed diastolic parameters are intermediate and EF 65 to 70%.  History of CVA with residual right-sided weakness - Currently on Eliquis and Crestor at home.  Eliquis on hold until MRI rules out any intracranial abnormality. - Physical exam showed patient has lip tremor and delayed speech.  Daughter verified that which is patient's baseline.   Paroxysmal atrial fibrillation - Holding Eliquis  until MRI will be available.  Continue Coreg twice daily.  Insulin-dependent DM type II -Resuming home insulin regimen.  Continue heart healthy carb modified diet.  Continue aspart 10 units 3 times daily, Semglee 10 unit in the a.m. and low sliding scale insulin as needed coverage.  Hyperlipidemia -Continue Crestor   Large hiatal hernia -Patient denies any abdominal pain.  Generalized anxiety disorder -Continue lorazepam 0.5 mg 3 times daily as needed.  Continue fall precaution.  DVT prophylaxis:  SCDs.  Holding any pharmacological prophylaxis at this time until MRI will be resulted Code Status:  Full Code Diet: Heart healthy carb modified diet Family Communication: Patient's daughter available on phone. Disposition Plan: Pending MRI.  Continue monitor improvement of blood pressure.  Consults: Neurology, speech therapist Admission status:   Inpatient, Telemetry bed  Severity of Illness: The appropriate patient status for this patient is INPATIENT. Inpatient status is judged to be reasonable and necessary in order to provide the required intensity of service to ensure the patient's safety. The patient's presenting symptoms, physical exam findings, and initial radiographic and laboratory data in the context of their chronic comorbidities is felt to place them at high risk for further clinical deterioration. Furthermore, it is not anticipated that the patient will be medically stable for discharge from the hospital within 2 midnights of admission.   * I certify that at the point of admission it is my clinical judgment that the patient will require inpatient hospital care spanning beyond 2 midnights from the point of admission due to high intensity of service, high risk for further deterioration and high frequency of surveillance required.Marland Kitchen    Tereasa Coop, MD [facility]  How to contact the [facility] Attending or Consulting provider 7A - 7P or covering provider during after hours 7P  -7A, for this patient.  Check the care team in [facility] and look for a) attending/consulting TRH provider listed and b) the [facility] team listed Log into [email or web address] and use [facility]'s universal password to access. If you do not have the password, please contact the hospital operator. Locate the [facility] provider you are looking for under [facility] and page to a number that you can be directly reached. If you still have difficulty reaching the provider, please page the [facility] (Director on Call) for the Hospitalists listed on amion for assistance.  06/24/2023, 11:37 PM

## 2023-06-24 NOTE — ED Provider Notes (Signed)
[place] EMERGENCY DEPARTMENT AT [facility] Provider Note   CSN: [id number] Arrival date & time: 06/24/23  2103     History  Chief Complaint  Patient presents with   Code Stroke    Donna Green is a 82 y.o. female.  The history is provided by the patient and the EMS personnel.  Patient brought in as a code stroke.  Reportedly had facial droop and difficulty speaking.  Had confusion.  Reportedly awake and appropriate normally but had facial droop with last normal around 815.  She is on Eliquis.    Past Medical History:  Diagnosis Date   Anxiety 03/23/2022   Chronic kidney disease    Diabetes mellitus without complication (HCC)    Headache    Hyperlipidemia    Hypertension    Right hemiparesis (HCC)    Small bowel obstruction (HCC)    Stroke (HCC)    x2    Home Medications Prior to Admission medications   Medication Sig Start Date End Date Taking? Authorizing Provider  acetaminophen (TYLENOL) 325 MG tablet Take 650 mg by mouth 2 (two) times daily as needed for mild pain (pain score 1-3) or moderate pain (pain score 4-6).   Yes [provider]  apixaban (ELIQUIS) 2.5 MG TABS tablet Take 1 tablet (2.5 mg total) by mouth 2 (two) times daily. 06/10/23  Yes Dorcas Carrow, MD  calcium carbonate (TUMS EX) 750 MG chewable tablet Chew 1 tablet by mouth daily as needed for heartburn.   Yes [provider]  carvedilol (COREG) 6.25 MG tablet Take 1 tablet (6.25 mg total) by mouth 2 (two) times daily with a meal. 06/10/23 07/10/23 Yes Ghimire, Lyndel Safe, MD  insulin aspart (NOVOLOG FLEXPEN) 100 UNIT/ML FlexPen Inject 8 Units into the skin 3 (three) times daily with meals.   Yes [provider]  insulin glargine (LANTUS) 100 UNIT/ML injection Inject 20 Units into the skin at bedtime.   Yes [provider]  LORazepam (ATIVAN) 0.5 MG tablet Take 1 tablet (0.5 mg total) by mouth every 8 (eight) hours as needed for anxiety. Patient taking  differently: Take 0.5 mg by mouth in the morning and at bedtime. 06/10/23  Yes Ghimire, Lyndel Safe, MD  polyethylene glycol (MIRALAX / GLYCOLAX) 17 g packet Take 17 g by mouth daily. Patient taking differently: Take 17 g by mouth daily as needed for mild constipation or moderate constipation. 06/11/23  Yes Dorcas Carrow, MD  rosuvastatin (CRESTOR) 20 MG tablet Take 20 mg by mouth at bedtime.   Yes [provider]      Allergies    Codeine, Dilaudid [hydromorphone], Morphine, Colcrys [colchicine], Zyloprim [allopurinol], and Nsaids    Review of Systems   Review of Systems  Physical Exam Updated Vital Signs BP (!) 205/105 (BP Location: Left Arm)   Pulse 100   Temp 99.6 F (37.6 C) (Oral)   Resp 18   SpO2 100%  Physical Exam Vitals and nursing note reviewed.  HENT:     Head: Normocephalic.  Eyes:     Extraocular Movements: Extraocular movements intact.  Skin:    General: Skin is warm.  Neurological:     Mental Status: She is alert.     Comments: Awake and pleasant.  Able to give her name but some confusion.  Not able to tell the year.  Able to identify 3 things but not tell where she is.  Face symmetric.  Moving all extremities.  Symmetric strength.     ED Results /  Procedures / Treatments   Labs (all labs ordered are listed, but only abnormal results are displayed) Labs Reviewed  PROTIME-INR - Abnormal; Notable for the following components:      Result Value   Prothrombin Time 17.7 (*)    INR 1.4 (*)    All other components within normal limits  APTT - Abnormal; Notable for the following components:   aPTT 42 (*)    All other components within normal limits  CBC - Abnormal; Notable for the following components:   RBC 3.24 (*)    Hemoglobin 10.5 (*)    HCT 33.7 (*)    MCV 104.0 (*)    All other components within normal limits  DIFFERENTIAL - Abnormal; Notable for the following components:   Monocytes Absolute 1.2 (*)    All other components within normal limits   COMPREHENSIVE METABOLIC PANEL - Abnormal; Notable for the following components:   Glucose, Bld 136 (*)    Creatinine, Ser 1.68 (*)    Calcium 8.6 (*)    Albumin 2.4 (*)    GFR, Estimated 30 (*)    All other components within normal limits  I-STAT CHEM 8, ED - Abnormal; Notable for the following components:   Creatinine, Ser 1.70 (*)    Glucose, Bld 130 (*)    Calcium, Ion 1.03 (*)    Hemoglobin 11.9 (*)    HCT 35.0 (*)    All other components within normal limits  ETHANOL  RAPID URINE DRUG SCREEN, HOSP PERFORMED  URINALYSIS, ROUTINE W REFLEX MICROSCOPIC    EKG None  Radiology CT HEAD CODE STROKE WO CONTRAST  Result Date: 06/24/2023 CLINICAL DATA:  Code stroke. Initial evaluation for altered mental status, right facial droop. EXAM: CT HEAD WITHOUT CONTRAST TECHNIQUE: Contiguous axial images were obtained from the base of the skull through the vertex without intravenous contrast. RADIATION DOSE REDUCTION: This exam was performed according to the departmental dose-optimization program which includes automated exposure control, adjustment of the mA and/or kV according to patient size and/or use of iterative reconstruction technique. COMPARISON:  Prior study from 06/02/2023. FINDINGS: Brain: Age-related cerebral atrophy with moderately advanced chronic microvascular ischemic disease. Remote lacunar infarct at the posterior limb of the right internal capsule. No acute intracranial hemorrhage. No acute large vessel territory infarct. No mass lesion or midline shift. No hydrocephalus or extra-axial fluid collection. Vascular: No abnormal hyperdense vessel. Calcified atherosclerosis present at the skull base. Skull: Scalp soft tissues and calvarium within normal limits. Sinuses/Orbits: Globes and orbital soft tissues within normal limits. Paranasal sinuses are largely clear. Few small osteomas noted about the left ethmoidal air cells. Mastoid air cells are clear. Other: None. ASPECTS [facility]  Stroke Program Early CT Score) - Ganglionic level infarction (caudate, lentiform nuclei, internal capsule, insula, M1-M3 cortex): 7 - Supraganglionic infarction (M4-M6 cortex): 3 Total score (0-10 with 10 being normal): 10 IMPRESSION: 1. No acute intracranial abnormality. 2. ASPECTS is 10. 3. Age-related cerebral atrophy with moderate chronic microvascular ischemic disease. These results were communicated to Dr. Wilford Corner at 9:21 pm on 06/24/2023 by text page via the [facility] messaging system. Electronically Signed   By: Rise Mu M.D.   On: 06/24/2023 21:24    Procedures Procedures    Medications Ordered in ED Medications  labetalol (NORMODYNE) injection 10 mg (has no administration in time range)  labetalol (NORMODYNE) injection 10 mg (10 mg Intravenous Given 06/24/23 2135)  hydrALAZINE (APRESOLINE) injection 10 mg (10 mg Intravenous Given 06/24/23 2133)    ED Course/  Medical Decision Making/ A&P                                 Medical Decision Making Amount and/or Complexity of Data Reviewed Labs: ordered. Radiology: ordered.  Risk Prescription drug management.   Patient came in as a code stroke.  Facial droop that resolved and some confusion.  Hypertensive.  Reportedly pressure of 230 for  EMS. Met at the bridge by myself and Dr. Jerrell Belfast from neurology.  Nonfocal exam but does have confusion.  Initial head CT independently interpreted and shows no bleed.  Reassuring.  Blood pressure still elevated.  Has been given IV antihypertensives.  Thought that this may be hypertensive emergency.  MRI has been ordered.  Blood work done and reassuring.  White count normal and afebrile.  Infection felt less likely.  Will give another dose of labetalol but will admit.  CRITICAL CARE Performed by: Benjiman Core Total critical care time: 30 minutes Critical care time was exclusive of separately billable procedures and treating other patients. Critical care was necessary to treat or  prevent imminent or life-threatening deterioration. Critical care was time spent personally by me on the following activities: development of treatment plan with patient and/or surrogate as well as nursing, discussions with consultants, evaluation of patient's response to treatment, examination of patient, obtaining history from patient or surrogate, ordering and performing treatments and interventions, ordering and review of laboratory studies, ordering and review of radiographic studies, pulse oximetry and re-evaluation of patient's condition.         Final Clinical Impression(s) / ED Diagnoses Final diagnoses:  Hypertensive emergency    Rx / DC Orders ED Discharge Orders     None         Benjiman Core, MD 06/24/23 2223

## 2023-06-24 NOTE — Consult Note (Signed)
NEUROLOGY CONSULT NOTE   Date of service: June 24, 2023 Patient Name: Donna Green MRN:  [id number] DOB:  23-Oct-1940 Chief Complaint: "Transient right facial droop, altered mental status" Requesting Provider: Benjiman Core, MD  History of Present Illness  Donna Green is a 82 y.o. female  has a past medical history of Anxiety (03/23/2022), Chronic kidney disease, Diabetes mellitus without complication (HCC), Headache, Hyperlipidemia, Hypertension, Right hemiparesis (HCC), Small bowel obstruction (HCC), and Stroke (HCC).  Recent diagnosis of paroxysmal A-fib with RVR started on Eliquis, who is usually oriented x 4 at baseline per daughter who I spoke with over the phone, who presents with complaints of transient right facial weakness and altered mental status. Daughter reports last known well time little before 8:15 PM when she had given him food and when she went to check back on him at around 8:15 PM, she had slurred speech and the right side of the face was a little droopy.  She also appeared a little confused.  EMS was called.  Her systolic blood pressure was in the 230 range.  Her being on Eliquis, last dose taken consistently as ordered today, EMS activated a code stroke for concern for strokelike symptoms of sudden onset. Patient is unable to provide reliable history. Daughter was contacted over the phone for the history.  Recent admission to the hospital in October for new onset paroxysmal A-fib with RVR as well as acute metabolic encephalopathy likely secondary to UTI and polypharmacy along with hypertensive crisis.  She had a stroke in the 90s and then again in the 2000's with some residual right hemiparesis.  Is able to walk independently.  Lives with her daughter and granddaughter.   LKW: Little before 8:15 PM Modified rankin score: 2-Slight disability-UNABLE to perform all activities but does not need assistance IV Thrombolysis: On Eliquis-last dose within 24 hours EVT:  Clinical exam not consistent with LVO   ROS  Comprehensive ROS performed and pertinent positives documented in HPI   Past History   Past Medical History:  Diagnosis Date   Anxiety 03/23/2022   Chronic kidney disease    Diabetes mellitus without complication (HCC)    Headache    Hyperlipidemia    Hypertension    Right hemiparesis (HCC)    Small bowel obstruction (HCC)    Stroke (HCC)    x2    Past Surgical History:  Procedure Laterality Date   BLADDER SURGERY     BOWEL RESECTION     EYE SURGERY     TUBAL LIGATION      Family History: Family History  Problem Relation Age of Onset   Diabetes Mother    Heart disease Mother    Parkinsonism Father    Diabetes Sister    Diabetes Brother    Cancer Brother    Diabetes Maternal Grandmother     Social History  reports that she has never smoked. She has never used smokeless tobacco. She reports that she does not drink alcohol and does not use drugs.  Allergies  Allergen Reactions   Codeine Other (See Comments)    Paranoid  Hallucinations    Dilaudid [Hydromorphone] Anxiety    Hallucinations, agitation, paranoia    Morphine Anxiety    Hallucinations, paranoia, agitation   Colcrys [Colchicine] Nausea Only   Zyloprim [Allopurinol] Nausea Only   Nsaids Other (See Comments)    Stage 3 kidney disease    Medications   Current Facility-Administered Medications:    hydrALAZINE (APRESOLINE) injection 10 mg,  10 mg, Intravenous, Once, Milon Dikes, MD   labetalol (NORMODYNE) injection 10 mg, 10 mg, Intravenous, Once, Milon Dikes, MD  Current Outpatient Medications:    acetaminophen (TYLENOL) 325 MG tablet, Take 650 mg by mouth 2 (two) times daily., Disp: , Rfl:    acetaminophen (TYLENOL) 325 MG tablet, Take 2 tablets (650 mg total) by mouth every 6 (six) hours as needed for mild pain (pain score 1-3) (or Fever >/= 101)., Disp: , Rfl:    apixaban (ELIQUIS) 2.5 MG TABS tablet, Take 1 tablet (2.5 mg total) by mouth 2 (two)  times daily., Disp: 60 tablet, Rfl: 2   carvedilol (COREG) 6.25 MG tablet, Take 1 tablet (6.25 mg total) by mouth 2 (two) times daily with a meal., Disp: , Rfl:    cyanocobalamin (VITAMIN B12) 1000 MCG tablet, Take 1 tablet (1,000 mcg total) by mouth daily., Disp: , Rfl:    insulin aspart (NOVOLOG FLEXPEN) 100 UNIT/ML FlexPen, Inject 8 Units into the skin 3 (three) times daily with meals., Disp: , Rfl:    insulin glargine (LANTUS) 100 UNIT/ML injection, Inject 14 Units into the skin at bedtime., Disp: , Rfl:    LORazepam (ATIVAN) 0.5 MG tablet, Take 1 tablet (0.5 mg total) by mouth every 8 (eight) hours as needed for anxiety., Disp: 30 tablet, Rfl: 0   polyethylene glycol (MIRALAX / GLYCOLAX) 17 g packet, Take 17 g by mouth daily., Disp: 14 each, Rfl: 0   QUEtiapine (SEROQUEL) 25 MG tablet, Take 0.5 tablets (12.5 mg total) by mouth at bedtime., Disp: 15 tablet, Rfl: 0   rosuvastatin (CRESTOR) 20 MG tablet, Take 20 mg by mouth daily., Disp: , Rfl:   Vitals  There were no vitals filed for this visit.  There is no height or weight on file to calculate BMI.  Physical Exam   Constitutional: Appears well-developed and well-nourished.  Psych: Affect appropriate to situation.  Eyes: No scleral injection.  HENT: No OP obstruction.  Head: Normocephalic.  Cardiovascular: Normal rate and regular rhythm.  Respiratory: Effort normal, non-labored breathing.  GI: Soft.  No distension. There is no tenderness.  Skin: WDI.  Neurological exam: Awake alert oriented to self, could not tell me where she is, told me the correct month and age but could not tell me the correct year. No dysarthria No evidence of aphasia-naming, comprehension and repetition are intact. Cranial nerves: 2-12 intact Motor examination with no drift in any of the 4 extremities Sensation intact light touch without extinction Coordination examination reveals no dysmetria. NIH stroke scale 1a Level of Conscious.: 0 1b LOC Questions:  0 1c LOC Commands: 0 2 Best Gaze: 0 3 Visual: 0 4 Facial Palsy: 0 5a Motor Arm - left: 0 5b Motor Arm - Right: 0 6a Motor Leg - Left: 0 6b Motor Leg - Right: 0 7 Limb Ataxia: 0 8 Sensory: 0 9 Best Language: 0 10 Dysarthria: 0 11 Extinct. and Inatten.: 0 TOTAL: 0  Labs/Imaging/Neurodiagnostic studies   CBC:  Recent Labs  Lab 07/21/2023 2109 Jul 21, 2023 2112  WBC 8.9  --   NEUTROABS 5.4  --   HGB 10.5* 11.9*  HCT 33.7* 35.0*  MCV 104.0*  --   PLT 320  --     Basic Metabolic Panel:  Lab Results  Component Value Date   NA 140 Jul 21, 2023   K 3.6 2023-07-21   CO2 19 (L) 06/07/2023   GLUCOSE 130 (H) 21-Jul-2023   BUN 16 Jul 21, 2023   CREATININE 1.70 (H) 2023/07/21  CALCIUM 9.1 06/07/2023   GFRNONAA 20 (L) 06/07/2023   GFRAA 24 (L) 03/18/2019    Lipid Panel:  Lab Results  Component Value Date   LDLCALC 25 03/07/2015    HgbA1c:  Lab Results  Component Value Date   HGBA1C 7.7 (H) 06/03/2023   Alcohol Level     Component Value Date/Time   ETH <5 12/16/2014 1951    INR  Lab Results  Component Value Date   INR 0.98 12/16/2014    APTT  Lab Results  Component Value Date   APTT 32 12/16/2014    CT Head without contrast(Personally reviewed): Aspects 10.  No bleed   2D echocardiogram done on 06/02/2023-LVEF 65 to 70%, small pericardial effusion, degenerative mitral valve with trivial regurgitation.  Aortic valve not well-visualized-moderate calcification of the aortic valve.  Left atrium normal size.  Impression  Donna Green is a 82 y.o. female  has a past medical history of Anxiety (03/23/2022), Chronic kidney disease, Diabetes mellitus without complication (HCC), Headache, Hyperlipidemia, Hypertension, Right hemiparesis (HCC), Small bowel obstruction (HCC), and Stroke (HCC).  Recent diagnosis of paroxysmal A-fib with RVR started on Eliquis, who is usually oriented x 4 at baseline per daughter who I spoke with over the phone, who presents with complaints of  transient right facial weakness and altered mental status. On my examination, she could not tell me the correct year but otherwise had no focal deficits.  NIH stroke scale 0. Prior admissions for hypertensive emergency, A-fib with RVR as well as UTI causing metabolic encephalopathy. I suspect that at this time, the differentials should include stroke/TIA given her transient focal right facial weakness and atrial fibrillation but more likely is again hypertensive emergency versus an infectious cause such as a UTI  Impression Hypertensive emergency versus toxic metabolic encephalopathy Lower on differentials include stroke/TIA  Recommendations  MRI brain without contrast-pursue a full stroke/TIA workup only if the MRI shows a stroke.  She has had an echocardiogram as well as A1c etc. checked very recently last month. I will check a UA, chest x-ray.  Treat if she has a UTI or pneumonia per primary team. I would also control her blood pressure better-given that she is on Eliquis, I would at this point keep her systolic blood pressures less than 180. If the MRI does not show any stroke, I would treat this as a hypertensive emergency and bring the blood pressure down by 20% each day. If she is not back to baseline and everything else remains unremarkable, consider an EEG at that time.  Neurology will follow with you and make that determination if needed. Plan was discussed with Dr. Rubin Payor in the ED  ______________________________________________________________________    Signed,  Milon Dikes [facility]  CRITICAL CARE ATTESTATION Performed by: Milon Dikes, MD Total critical care time: 31 minutes Critical care time was exclusive of separately billable procedures and treating other patients and/or supervising APPs/Residents/Students Critical care was necessary to treat or prevent imminent or life-threatening deterioration. This patient is critically ill and at significant  risk for neurological worsening and/or death and care requires constant monitoring. Critical care was time spent personally by me on the following activities: development of treatment plan with patient and/or surrogate as well as nursing, discussions with consultants, evaluation of patient's response to treatment, examination of patient, obtaining history from patient or surrogate, ordering and performing treatments and interventions, ordering and review of laboratory studies, ordering and review of radiographic studies, pulse oximetry, re-evaluation of patient's condition, participation in  multidisciplinary rounds and medical decision making of high complexity in the care of this patient.

## 2023-06-24 NOTE — ED Notes (Signed)
Patient transported to MRI 

## 2023-06-24 NOTE — Progress Notes (Deleted)
Cardiology Office Note:  .   Date:  06/24/2023  ID:  Donna Green, DOB 03-Dec-1940, MRN [id number] PCP: Renford Dills, MD  [facility] Providers Cardiologist:  Christell Constant, MD {  History of Present Illness: .   Donna Green is a 82 y.o. 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  On presentation, her blood pressure was elevated at 2 9/108 and she was found to be in new onset atrial fibrillation with RVR.  Troponins were mildly elevated at 100, then 104, then 105 then 92.  Chest x-ray showed no acute cardiopulmonary process but did show a large hiatal hernia.  CT of the head with no acute intracranial abnormality.  CT cervical spine showed no acute fractures.  CT abdomen pelvis showed mild L1 superior endplate central compression fracture with slightly posterior bowing of the dorsal vertebral cortex but no significant anterior posterior height loss, large hiatal hernia with inverted intrathoracic stomach, diverticulosis without diverticulitis.  Luckily, no fracture or effusion on left knee x-ray and left femur x-ray with no acute findings.  Cardiology was consulted and palliative care was consulted for goals of care.  Delirium was improving but she was still weak and debilitated so she was discharged to a SNF.  She was discharged on Eliquis and carvedilol 6.25 mg twice daily and was in normal sinus rhythm on discharge.  Follow-up with cardiology was arranged.  Today, she***  ROS: Pertinent ROS in HPI  Studies Reviewed: .       Echocardiogram 06/02/2023 IMPRESSIONS     1. Difficult strain acquisition; consider future PYP testing or CMR  testing. Left ventricular ejection fraction, by estimation, is 65 to  70%.  The left ventricle has normal function. The left ventricle has no regional  wall motion abnormalities. There is  severe asymmetric left ventricular hypertrophy of the basal-septal  segment. Left ventricular diastolic parameters are indeterminate.   2. Right ventricular systolic function is normal. The right ventricular  size is normal. Moderately increased right ventricular wall thickness.  There is mildly elevated pulmonary artery systolic pressure. The estimated  right ventricular systolic pressure   is 38.0 mmHg.   3. A small pericardial effusion is present. The pericardial effusion is  circumferential.   4. The mitral valve is degenerative. Trivial mitral valve regurgitation.  The mean mitral valve gradient is 3.7 mmHg with average heart rate of 83  bpm. Moderate mitral annular calcification.   5. Tricuspid valve regurgitation is mild to moderate.   6. The aortic valve was not well visualized. There is moderate  calcification of the aortic valve. Aortic valve regurgitation is not  visualized. Aortic valve sclerosis is present, with no evidence of aortic  valve stenosis.   7. The inferior vena cava is normal in size with greater than 50%  respiratory variability, suggesting right atrial pressure of 3 mmHg.   FINDINGS   Left Ventricle: Difficult strain acquisition; consider future PYP testing  or CMR testing. Left ventricular ejection fraction, by estimation, is 65  to 70%. The left ventricle has normal function. The left ventricle has no  regional wall motion  abnormalities. Global longitudinal strain performed but not reported based  on interpreter judgement due to suboptimal tracking. The left ventricular  internal cavity  size was normal in size. There is severe asymmetric left  ventricular hypertrophy of the  basal-septal segment. Left ventricular diastolic parameters are  indeterminate.   Right Ventricle: The right ventricular size is normal. Moderately  increased  right ventricular wall thickness. Right ventricular systolic  function is normal. There is mildly elevated pulmonary artery systolic  pressure. The tricuspid regurgitant velocity  is 2.96 m/s, and with an assumed right atrial pressure of 3 mmHg, the  estimated right ventricular systolic pressure is 38.0 mmHg.   Left Atrium: Left atrial size was normal in size.   Right Atrium: Right atrial size was normal in size.   Pericardium: A small pericardial effusion is present. The pericardial  effusion is circumferential.   Mitral Valve: The mitral valve is degenerative in appearance. Moderate  mitral annular calcification. Trivial mitral valve regurgitation. MV peak  gradient, 6.7 mmHg. The mean mitral valve gradient is 3.7 mmHg with  average heart rate of 83 bpm.   Tricuspid Valve: The tricuspid valve is grossly normal. Tricuspid valve  regurgitation is mild to moderate. No evidence of tricuspid stenosis.   Aortic Valve: The aortic valve was not well visualized. There is moderate  calcification of the aortic valve. Aortic valve regurgitation is not  visualized. Aortic valve sclerosis is present, with no evidence of aortic  valve stenosis. Aortic valve mean  gradient measures 4.0 mmHg. Aortic valve peak gradient measures 6.2 mmHg.  Aortic valve area, by VTI measures 1.67 cm.   Pulmonic Valve: The pulmonic valve was not well visualized. Pulmonic valve  regurgitation is not visualized. No evidence of pulmonic stenosis.   Aorta: The aortic root is normal in size and structure.   Venous: The inferior vena cava is normal in size with greater than 50%  respiratory variability, suggesting right atrial pressure of 3 mmHg.   IAS/Shunts: No atrial level shunt detected by color flow Doppler.      Risk Assessment/Calculations:   {Does this patient have ATRIAL FIBRILLATION?:[phone]} No BP recorded.  {Refresh Note OR Click here to enter BP  :1}***       Physical Exam:   VS:  There were no  vitals taken for this visit.   Wt Readings from Last 3 Encounters:  06/10/23 169 lb 8.5 oz (76.9 kg)  03/22/22 182 lb 5.1 oz (82.7 kg)  03/07/15 182 lb 6.4 oz (82.7 kg)    GEN: Well nourished, well developed in no acute distress NECK: No JVD; No carotid bruits CARDIAC: ***RRR, no murmurs, rubs, gallops RESPIRATORY:  Clear to auscultation without rales, wheezing or rhonchi  ABDOMEN: Soft, non-tender, non-distended EXTREMITIES:  No edema; No deformity   ASSESSMENT AND PLAN: .   Hypertensive crisis New onset atrial fibrillation CVA x 2 CKD stage IV    {Are you ordering a CV Procedure (e.g. stress test, cath, DCCV, TEE, etc)?   Press F2        :[id number]}  Dispo: ***  Signed, Sharlene Dory, PA-C

## 2023-06-25 ENCOUNTER — Inpatient Hospital Stay (HOSPITAL_BASED_OUTPATIENT_CLINIC_OR_DEPARTMENT_OTHER): Payer: Medicare PPO

## 2023-06-25 ENCOUNTER — Ambulatory Visit: Payer: Medicare PPO | Attending: Physician Assistant | Admitting: Physician Assistant

## 2023-06-25 ENCOUNTER — Inpatient Hospital Stay (HOSPITAL_COMMUNITY): Payer: Medicare PPO

## 2023-06-25 DIAGNOSIS — I639 Cerebral infarction, unspecified: Secondary | ICD-10-CM

## 2023-06-25 DIAGNOSIS — I4891 Unspecified atrial fibrillation: Secondary | ICD-10-CM

## 2023-06-25 DIAGNOSIS — N184 Chronic kidney disease, stage 4 (severe): Secondary | ICD-10-CM

## 2023-06-25 DIAGNOSIS — G9341 Metabolic encephalopathy: Secondary | ICD-10-CM | POA: Diagnosis not present

## 2023-06-25 DIAGNOSIS — I6340 Cerebral infarction due to embolism of unspecified cerebral artery: Secondary | ICD-10-CM | POA: Diagnosis not present

## 2023-06-25 DIAGNOSIS — I169 Hypertensive crisis, unspecified: Secondary | ICD-10-CM

## 2023-06-25 LAB — COMPREHENSIVE METABOLIC PANEL
ALT: 12 U/L (ref 0–44)
AST: 19 U/L (ref 15–41)
Albumin: 2.2 g/dL — ABNORMAL LOW (ref 3.5–5.0)
Alkaline Phosphatase: 70 U/L (ref 38–126)
Anion gap: 11 (ref 5–15)
BUN: 15 mg/dL (ref 8–23)
CO2: 23 mmol/L (ref 22–32)
Calcium: 8.3 mg/dL — ABNORMAL LOW (ref 8.9–10.3)
Chloride: 105 mmol/L (ref 98–111)
Creatinine, Ser: 1.46 mg/dL — ABNORMAL HIGH (ref 0.44–1.00)
GFR, Estimated: 35 mL/min — ABNORMAL LOW (ref >60)
Glucose, Bld: 157 mg/dL — ABNORMAL HIGH (ref 70–99)
Potassium: 3.6 mmol/L (ref 3.5–5.1)
Sodium: 139 mmol/L (ref 135–145)
Total Bilirubin: 0.7 mg/dL (ref <1.2)
Total Protein: 6.6 g/dL (ref 6.5–8.1)

## 2023-06-25 LAB — CBC
HCT: 31.5 % — ABNORMAL LOW (ref 36.0–46.0)
Hemoglobin: 10 g/dL — ABNORMAL LOW (ref 12.0–15.0)
MCH: 32.5 pg (ref 26.0–34.0)
MCHC: 31.7 g/dL (ref 30.0–36.0)
MCV: 102.3 fL — ABNORMAL HIGH (ref 80.0–100.0)
Platelets: 321 10*3/uL (ref 150–400)
RBC: 3.08 MIL/uL — ABNORMAL LOW (ref 3.87–5.11)
RDW: 13.6 % (ref 11.5–15.5)
WBC: 8.5 10*3/uL (ref 4.0–10.5)
nRBC: 0 % (ref 0.0–0.2)

## 2023-06-25 LAB — URINALYSIS, ROUTINE W REFLEX MICROSCOPIC
Bilirubin Urine: NEGATIVE
Glucose, UA: 50 mg/dL — AB
Hgb urine dipstick: NEGATIVE
Ketones, ur: 5 mg/dL — AB
Leukocytes,Ua: NEGATIVE
Nitrite: NEGATIVE
Protein, ur: >=300 mg/dL — AB
Specific Gravity, Urine: 1.017 (ref 1.005–1.030)
pH: 6 (ref 5.0–8.0)

## 2023-06-25 LAB — RAPID URINE DRUG SCREEN, HOSP PERFORMED
Amphetamines: NOT DETECTED
Barbiturates: NOT DETECTED
Benzodiazepines: NOT DETECTED
Cocaine: NOT DETECTED
Opiates: NOT DETECTED
Tetrahydrocannabinol: NOT DETECTED

## 2023-06-25 LAB — LIPID PANEL
Cholesterol: 84 mg/dL (ref 0–200)
HDL: 38 mg/dL — ABNORMAL LOW (ref >40)
LDL Cholesterol: 33 mg/dL (ref 0–99)
Total CHOL/HDL Ratio: 2.2 {ratio}
Triglycerides: 64 mg/dL (ref <150)
VLDL: 13 mg/dL (ref 0–40)

## 2023-06-25 LAB — GLUCOSE, CAPILLARY
Glucose-Capillary: 105 mg/dL — ABNORMAL HIGH (ref 70–99)
Glucose-Capillary: 154 mg/dL — ABNORMAL HIGH (ref 70–99)

## 2023-06-25 LAB — CBG MONITORING, ED
Glucose-Capillary: 122 mg/dL — ABNORMAL HIGH (ref 70–99)
Glucose-Capillary: 144 mg/dL — ABNORMAL HIGH (ref 70–99)

## 2023-06-25 MED ORDER — APIXABAN 2.5 MG PO TABS
2.5000 mg | ORAL_TABLET | Freq: Two times a day (BID) | ORAL | Status: DC
  Administered 2023-06-25 – 2023-06-26 (×3): 2.5 mg via ORAL
  Filled 2023-06-25 (×3): qty 1

## 2023-06-25 MED ORDER — CARVEDILOL 12.5 MG PO TABS
12.5000 mg | ORAL_TABLET | Freq: Two times a day (BID) | ORAL | Status: DC
  Administered 2023-06-25 – 2023-06-26 (×4): 12.5 mg via ORAL
  Filled 2023-06-25 (×4): qty 1

## 2023-06-25 MED ORDER — LABETALOL HCL 5 MG/ML IV SOLN
10.0000 mg | INTRAVENOUS | Status: AC
  Administered 2023-06-25: 10 mg via INTRAVENOUS
  Filled 2023-06-25: qty 4

## 2023-06-25 MED ORDER — ENSURE ENLIVE PO LIQD
237.0000 mL | Freq: Two times a day (BID) | ORAL | Status: DC
  Administered 2023-06-25 – 2023-06-26 (×3): 237 mL via ORAL
  Filled 2023-06-25: qty 237

## 2023-06-25 MED ORDER — STROKE: EARLY STAGES OF RECOVERY BOOK
Freq: Once | Status: AC
  Filled 2023-06-25: qty 1

## 2023-06-25 NOTE — ED Notes (Signed)
MD Sundil made aware of HR and BP, order for Labetalol placed.

## 2023-06-25 NOTE — Care Management CC44 (Signed)
Condition Code 44 Documentation Completed  Patient Details  Name: Donna Green MRN: [id number] Date of Birth: 05-01-1941   Condition Code 44 given:  Yes Patient signature on Condition Code 44 notice:  Yes Documentation of 2 MD's agreement:  Yes Code 44 added to claim:  Yes    Oletta Cohn, RN 06/25/2023, 12:48 PM

## 2023-06-25 NOTE — Progress Notes (Addendum)
Acute ischemic stroke of right frontal and left frontoparietal region: -MRI of the brain showed acute to early subacute small vessel type ischemic infarcts involving the subcortical right frontal lobe and left frontoparietal region. No associated hemorrhage or mass effect. -Informed neurology Dr. Wilford Corner made recommendation concern for acute ischemic stroke versus cardioembolic source. -Continue neurocheck every 4 hours, continue cardiac monitoring, continue Eliquis 5 mg twice daily.  Given patient is on Eliquis neurology recommended continue permissive hypertension for 24 hours but treat if systolic blood pressure above 914. -Pending lipid panel.  Continue Crestor. -Full A1c below 7.  Continue to treat for DM type as mentioned below. - Defer echocardiogram at this time as patient has echo in 06/02/2023.  No need for repeat echo. -Consulted inpatient PT and OT. - Requested bedside nurse for swallow screen.  After passing swallow screen will continue oral diet and medications. -Stroke team will continue to follow. -Appreciate neurology input and recommendation.   Tereasa Coop, MD [facility] 06/25/2023, 3:38 AM

## 2023-06-25 NOTE — Progress Notes (Signed)
Carotid arterial duplex completed. Please see CV Procedures for preliminary results.  Shona Simpson, RVT 06/25/23 1:12 PM

## 2023-06-25 NOTE — Evaluation (Addendum)
Physical Therapy Evaluation Patient Details Name: Donna Green MRN: [id number] DOB: Jul 20, 1941 Today's Date: 06/25/2023  History of Present Illness  Pt is 82 yo female who presents on 06/24/23 with AMS, trouble speaking and facial droop.  Found to have BP 255/105 and MRI showing acute to early subacute small vessel ischemic infarcts R frontal and L frontoparietal.  PMH: recent fall with L1 comp fx, DM, HLD, HTN, CKD, HOH, PAF on Eliquis, large HH, GAD and CVA x2 with residual R hemiparesis.  Clinical Impression  Patient presents with mobility close to baseline per her daughter.  Struggled with bed mobility with cues for technique due to spinal precautions, but allowed her to get into bed and less assist needed, though daughter reports has to help her position for safety as gets in close to EOB.  Patient able to ambulate with RW with A due to postural deficits and veering R at times.  Patient with previous R side weakness from stroke and per daughter stands straighter when wearing her back brace which is at home.  Feel stable for home from therapy standpoint with continued HHPT at d/c.  Will follow along while in the acute setting.         If plan is discharge home, recommend the following: A little help with walking and/or transfers;A little help with bathing/dressing/bathroom;Help with stairs or ramp for entrance;Assist for transportation;Assistance with cooking/housework   Can travel by private vehicle   Yes    Equipment Recommendations None recommended by PT  Recommendations for Other Services       Functional Status Assessment Patient has had a recent decline in their functional status and demonstrates the ability to make significant improvements in function in a reasonable and predictable amount of time.     Precautions / Restrictions Precautions Precautions: Fall;Back Spinal Brace: Thoracolumbosacral orthotic (daughter reports brace at home)      Mobility  Bed  Mobility Overal bed mobility: Needs Assistance Bed Mobility: Rolling, Sidelying to Sit Rolling: Min assist Sidelying to sit: Mod assist Supine to sit: Min assist     General bed mobility comments: cues for technique and assisting to lift trunk and guide legs off edge of stretcher, to supine allowed her to do her normal technique, assist to guide legs over EOB and cues for positioning    Transfers Overall transfer level: Needs assistance Equipment used: Rolling walker (2 wheels) Transfers: Sit to/from Stand Sit to Stand: Supervision, Contact guard assist           General transfer comment: up from tall stretcher in ED to RW and A for balance    Ambulation/Gait Ambulation/Gait assistance: Contact guard assist Gait Distance (Feet): 110 Feet Assistive device: Rolling walker (2 wheels) Gait Pattern/deviations: Step-through pattern, Decreased stride length, Trunk flexed       General Gait Details: increased proximity to RW and A with cues for posture (daughter reports her normal tendency to get behind walker, though with back brace usually stands straighter)  Stairs            Wheelchair Mobility     Tilt Bed    Modified Rankin (Stroke Patients Only) Modified Rankin (Stroke Patients Only) Pre-Morbid Rankin Score: Moderately severe disability Modified Rankin: Moderately severe disability     Balance Overall balance assessment: Needs assistance   Sitting balance-Leahy Scale: Fair     Standing balance support: Bilateral upper extremity supported Standing balance-Leahy Scale: Poor Standing balance comment: static balance with S with RW; A for dynamic mobility  Pertinent Vitals/Pain Pain Assessment Faces Pain Scale: Hurts little more Pain Location: generalized with side to sit Pain Descriptors / Indicators: Discomfort, Grimacing, Guarding Pain Intervention(s): Monitored during session    Home Living Family/patient  expects to be discharged to:: Private residence Living Arrangements: Alone (daughter) Available Help at Discharge: Available 24 hours/day Type of Home: House Home Access: Stairs to enter Entrance Stairs-Rails: None Entrance Stairs-Number of Steps: 2 Alternate Level Stairs-Number of Steps: 3 Home Layout: Multi-level Home Equipment: Agricultural consultant (2 wheels);BSC/3in1;Hand held shower head      Prior Function Prior Level of Function : Needs assist             Mobility Comments: walking with walker, some help at times ADLs Comments: daughter helps get into shower and pt showers     Extremity/Trunk Assessment   Upper Extremity Assessment Upper Extremity Assessment: Overall WFL for tasks assessed    Lower Extremity Assessment Lower Extremity Assessment: Generalized weakness RLE Deficits / Details: initially difficulty flexing knee then reported pain in thigh though WFL ROM and strength 3/5 or greater LLE Deficits / Details: no issues noted, though daughter reports since back fracture has had difficulty lifting L leg over tub for showering    Cervical / Trunk Assessment Cervical / Trunk Assessment: Kyphotic  Communication   Communication Communication: Hearing impairment  Cognition Arousal: Lethargic Behavior During Therapy: Flat affect Overall Cognitive Status: History of cognitive impairments - at baseline Area of Impairment: Attention, Following commands, Problem solving                   Current Attention Level: Sustained   Following Commands: Follows one step commands consistently, Follows one step commands with increased time     Problem Solving: Slow processing General Comments: some issues early in the morning or when lack of sleep (dtr reports did not sleep overnight as nervous being in hospital)        General Comments General comments (skin integrity, edema, etc.): VSS, pt on RA throughout; daughter present and reports has HHPT, discussed options  for showering and car transfers and to keep notes of difficult tasks to review with HHPT.    Exercises     Assessment/Plan    PT Assessment Patient needs continued PT services  PT Problem List Decreased balance;Decreased mobility;Decreased safety awareness;Decreased knowledge of use of DME;Pain       PT Treatment Interventions DME instruction;Gait training;Stair training;Functional mobility training;Therapeutic activities;Therapeutic exercise;Balance training;Patient/family education    PT Goals (Current goals can be found in the Care Plan section)  Acute Rehab PT Goals Patient Stated Goal: return home PT Goal Formulation: With patient/family Time For Goal Achievement: 07/09/23 Potential to Achieve Goals: Good    Frequency Min 1X/week     Co-evaluation               AM-PAC PT "6 Clicks" Mobility  Outcome Measure Help needed turning from your back to your side while in a flat bed without using bedrails?: A Little Help needed moving from lying on your back to sitting on the side of a flat bed without using bedrails?: A Lot Help needed moving to and from a bed to a chair (including a wheelchair)?: A Little Help needed standing up from a chair using your arms (e.g., wheelchair or bedside chair)?: A Little Help needed to walk in hospital room?: A Little Help needed climbing 3-5 steps with a railing? : Total 6 Click Score: 15    End of Session Equipment Utilized  During Treatment: Gait belt Activity Tolerance: Patient tolerated treatment well Patient left: in bed;with family/visitor present (transport arrived to take to ultrasound)   PT Visit Diagnosis: Other abnormalities of gait and mobility (R26.89);History of falling (Z91.81);Other symptoms and signs involving the nervous system (R29.898)    Time: 3086-5784 PT Time Calculation (min) (ACUTE ONLY): 19 min   Charges:   PT Evaluation $PT Eval Low Complexity: 1 Low   PT General Charges $$ ACUTE PT VISIT: 1 Visit          Sheran Lawless, PT Acute Rehabilitation Services Office:[phone] 06/25/2023   Donna Green 06/25/2023, 1:39 PM

## 2023-06-25 NOTE — Code Documentation (Signed)
Stroke Response Nurse Documentation Code Documentation  OAKLI ZECH is a 82 y.o. female arriving to [facility]  via [facility] on 11/11 with past medical hx of stroke, HTN, HLD, DM, CKD, afib. On Eliquis (apixaban) daily. Code stroke was activated by EMS.   Patient from SNF where she was LKW at 2015 and now complaining of right facial droop, AMS .  Stroke team at the bedside on patient arrival. Labs drawn and patient cleared for CT by Dr. Rubin Payor. Patient to CT with team. NIHSS 0, see documentation for details and code stroke times. Patient with no deficits on exam. The following imaging was completed:  CT Head and MRI. Patient is not a candidate for IV Thrombolytic due to Eliquis. Patient is not a candidate for IR due to low suspicion for LVO.    Bedside handoff with ED RN Paden.    Rose Fillers  Rapid Response RN

## 2023-06-25 NOTE — ED Notes (Signed)
Assumed care of patient, NAD noted at this time, pt resting in bed, respirations even and unlabored, skin warm and dry, bed in low position, call light within reach. Comfort measures offered. Will continue to monitor.

## 2023-06-25 NOTE — ED Notes (Signed)
Pt going to MRI

## 2023-06-25 NOTE — ED Notes (Signed)
Pt ambulated to the bathroom with the help of her daughter. Pt ambulates at home with a walker. Pt's daughter states she refuses to use a bedpan and says she was using the purewick with the last admission, but came home with a UTI.

## 2023-06-25 NOTE — Progress Notes (Signed)
PT Cancellation Note  Patient Details Name: Donna Green MRN: [id number] DOB: May 08, 1941   Cancelled Treatment:    Reason Eval/Treat Not Completed: Patient at procedure or test/unavailable; noted pt in MRI, will attempt later.    Elray Mcgregor 06/25/2023, 9:36 AM Sheran Lawless, PT Acute Rehabilitation Services Office:[phone] 06/25/2023

## 2023-06-25 NOTE — ED Notes (Signed)
ED TO INPATIENT HANDOFF REPORT  ED Nurse Name and Phone #: Cammy Copa  S Name/Age/Gender [name] 82 y.o. female Room/Bed: 040C/040C  Code Status   Code Status: Full Code  Home/SNF/Other Home Patient oriented to: self and place Is this baseline? Yes   Triage Complete: Triage complete  Chief Complaint Hypertensive emergency [I16.1] Stroke (cerebrum) (HCC) [I63.9]  Triage Note No notes on file   Allergies Allergies  Allergen Reactions   Codeine Other (See Comments)    Paranoid  Hallucinations    Dilaudid [Hydromorphone] Anxiety    Hallucinations, agitation, paranoia    Morphine Anxiety    Hallucinations, paranoia, agitation   Colcrys [Colchicine] Nausea Only   Zyloprim [Allopurinol] Nausea Only   Nsaids Other (See Comments)    Stage 3 kidney disease    Level of Care/Admitting Diagnosis ED Disposition     ED Disposition  Admit   Condition  --   Comment  Hospital Area: [facility] [[id number]]  Level of Care: Telemetry Medical [104]  May place patient in observation at [facility] or [facility] if equivalent level of care is available:: No  Covid Evaluation: Asymptomatic - no recent exposure (last 10 days) testing not required  Diagnosis: Stroke (cerebrum) [facility]) [[id number]]  Admitting Physician: Leatha Gilding [phone]  Attending Physician: Leatha Gilding [phone]          B Medical/Surgery History Past Medical History:  Diagnosis Date   Anxiety 03/23/2022   Chronic kidney disease    Diabetes mellitus without complication (HCC)    Headache    Hyperlipidemia    Hypertension    Right hemiparesis (HCC)    Small bowel obstruction (HCC)    Stroke (HCC)    x2   Past Surgical History:  Procedure Laterality Date   BLADDER SURGERY     BOWEL RESECTION     EYE SURGERY     TUBAL LIGATION       A IV Location/Drains/Wounds Patient Lines/Drains/Airways Status     Active Line/Drains/Airways     Name Placement date Placement  time Site Days   Peripheral IV 06/24/23 20 G Right Antecubital 06/24/23  2122  Antecubital  1            Intake/Output Last 24 hours No intake or output data in the 24 hours ending 06/25/23 1258  Labs/Imaging Results for orders placed or performed during the hospital encounter of 06/24/23 (from the past 48 hour(s))  Ethanol     Status: None   Collection Time: 06/24/23  9:09 PM  Result Value Ref Range   Alcohol, Ethyl (B) <10 <10 mg/dL    Comment: (NOTE) Lowest detectable limit for serum alcohol is 10 mg/dL.  For medical purposes only. Performed at [facility], [address] [place], [place] [address]   Protime-INR     Status: Abnormal   Collection Time: 06/24/23  9:09 PM  Result Value Ref Range   Prothrombin Time 17.7 (H) 11.4 - 15.2 seconds   INR 1.4 (H) 0.8 - 1.2    Comment: (NOTE) INR goal varies based on device and disease states. Performed at [facility], [address] [place], [place] [address]   APTT     Status: Abnormal   Collection Time: 06/24/23  9:09 PM  Result Value Ref Range   aPTT 42 (H) 24 - 36 seconds    Comment:        IF BASELINE aPTT IS ELEVATED, SUGGEST PATIENT  RISK ASSESSMENT BE USED TO DETERMINE APPROPRIATE ANTICOAGULANT THERAPY. Performed at [facility], [address] [place], [place] [address]   CBC     Status: Abnormal   Collection Time: 06/24/23  9:09 PM  Result Value Ref Range   WBC 8.9 4.0 - 10.5 K/uL   RBC 3.24 (L) 3.87 - 5.11 MIL/uL   Hemoglobin 10.5 (L) 12.0 - 15.0 g/dL   HCT 60.4 (L) 54.0 - 98.1 %   MCV 104.0 (H) 80.0 - 100.0 fL   MCH 32.4 26.0 - 34.0 pg   MCHC 31.2 30.0 - 36.0 g/dL   RDW 19.1 47.8 - 29.5 %   Platelets 320 150 - 400 K/uL   nRBC 0.0 0.0 - 0.2 %    Comment: Performed at [facility], [address], [place], [place] [address]  Differential     Status: Abnormal   Collection Time: 06/24/23  9:09 PM  Result Value Ref Range   Neutrophils Relative % 61 %   Neutro Abs 5.4 1.7 -  7.7 K/uL   Lymphocytes Relative 25 %   Lymphs Abs 2.2 0.7 - 4.0 K/uL   Monocytes Relative 13 %   Monocytes Absolute 1.2 (H) 0.1 - 1.0 K/uL   Eosinophils Relative 1 %   Eosinophils Absolute 0.1 0.0 - 0.5 K/uL   Basophils Relative 0 %   Basophils Absolute 0.0 0.0 - 0.1 K/uL   Immature Granulocytes 0 %   Abs Immature Granulocytes 0.04 0.00 - 0.07 K/uL    Comment: Performed at [facility], [address] [place], [place] [address]  Comprehensive metabolic panel     Status: Abnormal   Collection Time: 06/24/23  9:09 PM  Result Value Ref Range   Sodium 136 135 - 145 mmol/L   Potassium 3.5 3.5 - 5.1 mmol/L   Chloride 103 98 - 111 mmol/L   CO2 24 22 - 32 mmol/L   Glucose, Bld 136 (H) 70 - 99 mg/dL    Comment: Glucose reference range applies only to samples taken after fasting for at least 8 hours.   BUN 14 8 - 23 mg/dL   Creatinine, Ser 4.69 (H) 0.44 - 1.00 mg/dL   Calcium 8.6 (L) 8.9 - 10.3 mg/dL   Total Protein 7.3 6.5 - 8.1 g/dL   Albumin 2.4 (L) 3.5 - 5.0 g/dL   AST 21 15 - 41 U/L   ALT 14 0 - 44 U/L   Alkaline Phosphatase 77 38 - 126 U/L   Total Bilirubin 0.6 <1.2 mg/dL   GFR, Estimated 30 (L) >60 mL/min    Comment: (NOTE) Calculated using the CKD-EPI Creatinine Equation (2021)    Anion gap 9 5 - 15    Comment: Performed at [facility], [address] [place], [place] [address]  I-stat chem 8, ED     Status: Abnormal   Collection Time: 06/24/23  9:12 PM  Result Value Ref Range   Sodium 140 135 - 145 mmol/L   Potassium 3.6 3.5 - 5.1 mmol/L   Chloride 103 98 - 111 mmol/L   BUN 16 8 - 23 mg/dL   Creatinine, Ser 8.41 (H) 0.44 - 1.00 mg/dL   Glucose, Bld 324 (H) 70 - 99 mg/dL    Comment: Glucose reference range applies only to samples taken after fasting for at least 8 hours.   Calcium, Ion 1.03 (L) 1.15 - 1.40 mmol/L   TCO2 25 22 - 32 mmol/L   Hemoglobin  11.9 (L) 12.0 - 15.0 g/dL   HCT 04.5 (L) 40.9 - 81.1 %  Comprehensive metabolic panel     Status:  Abnormal   Collection Time: 06/25/23  3:35 AM  Result Value Ref Range   Sodium 139 135 - 145 mmol/L   Potassium 3.6 3.5 - 5.1 mmol/L   Chloride 105 98 - 111 mmol/L   CO2 23 22 - 32 mmol/L   Glucose, Bld 157 (H) 70 - 99 mg/dL    Comment: Glucose reference range applies only to samples taken after fasting for at least 8 hours.   BUN 15 8 - 23 mg/dL   Creatinine, Ser 9.14 (H) 0.44 - 1.00 mg/dL   Calcium 8.3 (L) 8.9 - 10.3 mg/dL   Total Protein 6.6 6.5 - 8.1 g/dL   Albumin 2.2 (L) 3.5 - 5.0 g/dL   AST 19 15 - 41 U/L   ALT 12 0 - 44 U/L   Alkaline Phosphatase 70 38 - 126 U/L   Total Bilirubin 0.7 <1.2 mg/dL   GFR, Estimated 35 (L) >60 mL/min    Comment: (NOTE) Calculated using the CKD-EPI Creatinine Equation (2021)    Anion gap 11 5 - 15    Comment: Performed at [facility], [address], [place], [place] [address]  CBC     Status: Abnormal   Collection Time: 06/25/23  3:35 AM  Result Value Ref Range   WBC 8.5 4.0 - 10.5 K/uL   RBC 3.08 (L) 3.87 - 5.11 MIL/uL   Hemoglobin 10.0 (L) 12.0 - 15.0 g/dL   HCT 62.1 (L) 30.8 - 65.7 %   MCV 102.3 (H) 80.0 - 100.0 fL   MCH 32.5 26.0 - 34.0 pg   MCHC 31.7 30.0 - 36.0 g/dL   RDW 84.6 96.2 - 95.2 %   Platelets 321 150 - 400 K/uL   nRBC 0.0 0.0 - 0.2 %    Comment: Performed at [facility], [address] [place], [place] [address]  Lipid panel     Status: Abnormal   Collection Time: 06/25/23  3:35 AM  Result Value Ref Range   Cholesterol 84 0 - 200 mg/dL   Triglycerides 64 <440 mg/dL   HDL 38 (L) >10 mg/dL   Total CHOL/HDL Ratio 2.2 RATIO   VLDL 13 0 - 40 mg/dL   LDL Cholesterol 33 0 - 99 mg/dL    Comment:        Total Cholesterol/HDL:CHD Risk Coronary Heart Disease Risk Table                     Men   Women  1/2 Average Risk   3.4   3.3  Average Risk       5.0   4.4  2 X Average Risk   9.6   7.1  3 X Average Risk  23.4   11.0        Use the calculated Patient Ratio above and the CHD Risk Table to determine the  patient's CHD Risk.        ATP III CLASSIFICATION (LDL):  <100     mg/dL   Optimal  272-536  mg/dL   Near or Above                    Optimal  130-159  mg/dL   Borderline  644-034  mg/dL   High  >742     mg/dL   Very High Performed  at [facility], [address] [place], [place] [address]   CBG monitoring, ED     Status: Abnormal   Collection Time: 06/25/23  7:59 AM  Result Value Ref Range   Glucose-Capillary 122 (H) 70 - 99 mg/dL    Comment: Glucose reference range applies only to samples taken after fasting for at least 8 hours.   Comment 1 Notify RN   Urine rapid drug screen (hosp performed)     Status: None   Collection Time: 06/25/23 11:05 AM  Result Value Ref Range   Opiates NONE DETECTED NONE DETECTED   Cocaine NONE DETECTED NONE DETECTED   Benzodiazepines NONE DETECTED NONE DETECTED   Amphetamines NONE DETECTED NONE DETECTED   Tetrahydrocannabinol NONE DETECTED NONE DETECTED   Barbiturates NONE DETECTED NONE DETECTED    Comment: (NOTE) DRUG SCREEN FOR MEDICAL PURPOSES ONLY.  IF CONFIRMATION IS NEEDED FOR ANY PURPOSE, NOTIFY LAB WITHIN 5 DAYS.  LOWEST DETECTABLE LIMITS FOR URINE DRUG SCREEN Drug Class                     Cutoff (ng/mL) Amphetamine and metabolites    1000 Barbiturate and metabolites    200 Benzodiazepine                 200 Opiates and metabolites        300 Cocaine and metabolites        300 THC                            50 Performed at [facility], [address] [place], [place] [address]   Urinalysis, Routine w reflex microscopic -Urine, Clean Catch     Status: Abnormal   Collection Time: 06/25/23 11:05 AM  Result Value Ref Range   Color, Urine YELLOW YELLOW   APPearance HAZY (A) CLEAR   Specific Gravity, Urine 1.017 1.005 - 1.030   pH 6.0 5.0 - 8.0   Glucose, UA 50 (A) NEGATIVE mg/dL   Hgb urine dipstick NEGATIVE NEGATIVE   Bilirubin Urine NEGATIVE NEGATIVE   Ketones, ur 5 (A) NEGATIVE mg/dL   Protein, ur >=347 (A)  NEGATIVE mg/dL   Nitrite NEGATIVE NEGATIVE   Leukocytes,Ua NEGATIVE NEGATIVE   RBC / HPF 0-5 0 - 5 RBC/hpf   WBC, UA 0-5 0 - 5 WBC/hpf   Bacteria, UA RARE (A) NONE SEEN   Squamous Epithelial / HPF 11-20 0 - 5 /HPF   Mucus PRESENT     Comment: Performed at [facility], [address] [place], [place] [address]  CBG monitoring, ED     Status: Abnormal   Collection Time: 06/25/23 11:35 AM  Result Value Ref Range   Glucose-Capillary 144 (H) 70 - 99 mg/dL    Comment: Glucose reference range applies only to samples taken after fasting for at least 8 hours.   MR ANGIO HEAD WO CONTRAST  Result Date: 06/25/2023 CLINICAL DATA:  Follow-up stroke. Chronic kidney disease. Hypertension. Atrial fibrillation. EXAM: MRA HEAD WITHOUT CONTRAST TECHNIQUE: Angiographic images of the Circle of Willis were acquired using MRA technique without intravenous contrast. COMPARISON:  Brain MRI yesterday. FINDINGS: Anterior circulation: Both internal carotid arteries are patent through the skull base and siphon regions. There is atherosclerotic narrowing and irregularity of the siphon regions, more on the right than the left. 50% stenosis in the right siphon. No measurable stenosis in the left siphon. On the  left, the ICA supplies the middle cerebral artery and both anterior cerebral arteries. On the right, the ICA supplies only the middle cerebral artery. No evidence of large vessel occlusion or proximal circle-of-Willis stenosis. No aneurysm or vascular malformation. Posterior circulation: Both vertebral arteries widely patent to the basilar artery. No basilar stenosis. Posterior circulation branch vessels are patent. There is moderate atherosclerotic narrowing and irregularity of the proximal posterior cerebral arteries, slightly more on the left than the right. Poor flow in the right superior cerebellar artery compared to the left. Anatomic variants: None other significant. Other: None. IMPRESSION: 1. No evidence of  large vessel occlusion or proximal Circle of Willis stenosis. 2. Atherosclerotic narrowing and irregularity of the siphon regions, more on the right than the left. 50% stenosis in the right siphon. 3. Moderate atherosclerotic narrowing and irregularity of the proximal posterior cerebral arteries, slightly more on the left than the right. Poor flow in the right superior cerebellar artery compared to the left. Electronically Signed   By: Paulina Fusi M.D.   On: 06/25/2023 10:29   MR BRAIN WO CONTRAST  Result Date: 06/25/2023 CLINICAL DATA:  Initial evaluation for neuro deficit, stroke suspected. EXAM: MRI HEAD WITHOUT CONTRAST TECHNIQUE: Multiplanar, multiecho pulse sequences of the brain and surrounding structures were obtained without intravenous contrast. COMPARISON:  Prior CT from earlier the same day. FINDINGS: Brain: Examination moderately degraded by motion artifact. Cerebral volume within normal limits. Patchy T2/FLAIR hyperintensity involving the periventricular white matter, most characteristic of chronic microvascular ischemic disease, mild for age. Two distinct punctate foci of diffusion signal abnormality are seen involving the subcortical right frontal lobe (series 2, image 40) as well as the subcortical left frontoparietal region (series 2, image 42). No associated hemorrhage or mass effect. Findings consistent with small acute to early subacute small vessel type ischemic infarcts. No associated hemorrhage or mass effect. No other evidence for acute or subacute ischemia. No acute intracranial hemorrhage. Single punctate chronic microhemorrhage noted at the left occipital lobe, of doubtful significance in isolation. No mass lesion, midline shift or mass effect. No hydrocephalus or extra-axial fluid collection. Pituitary gland suprasellar region within normal limits. Vascular: Major intracranial vascular flow voids are maintained. Skull and upper cervical spine: Craniocervical junction within normal  limits. Bone marrow signal intensity normal. No scalp soft tissue abnormality. Sinuses/Orbits: Prior ocular lens replacement on the right. Paranasal sinuses are largely clear. No mastoid effusion. Other: None. IMPRESSION: 1. Two punctate acute to early subacute small vessel type ischemic infarcts involving the subcortical right frontal lobe and left frontoparietal region. No associated hemorrhage or mass effect. 2. Underlying chronic microvascular ischemic disease, mild for age. Electronically Signed   By: Rise Mu M.D.   On: 06/25/2023 02:34   CT HEAD CODE STROKE WO CONTRAST  Result Date: 06/24/2023 CLINICAL DATA:  Code stroke. Initial evaluation for altered mental status, right facial droop. EXAM: CT HEAD WITHOUT CONTRAST TECHNIQUE: Contiguous axial images were obtained from the base of the skull through the vertex without intravenous contrast. RADIATION DOSE REDUCTION: This exam was performed according to the departmental dose-optimization program which includes automated exposure control, adjustment of the mA and/or kV according to patient size and/or use of iterative reconstruction technique. COMPARISON:  Prior study from 06/02/2023. FINDINGS: Brain: Age-related cerebral atrophy with moderately advanced chronic microvascular ischemic disease. Remote lacunar infarct at the posterior limb of the right internal capsule. No acute intracranial hemorrhage. No acute large vessel territory infarct. No mass lesion or midline shift. No hydrocephalus or extra-axial  fluid collection. Vascular: No abnormal hyperdense vessel. Calcified atherosclerosis present at the skull base. Skull: Scalp soft tissues and calvarium within normal limits. Sinuses/Orbits: Globes and orbital soft tissues within normal limits. Paranasal sinuses are largely clear. Few small osteomas noted about the left ethmoidal air cells. Mastoid air cells are clear. Other: None. ASPECTS [facility] Stroke Program Early CT Score) - Ganglionic  level infarction (caudate, lentiform nuclei, internal capsule, insula, M1-M3 cortex): 7 - Supraganglionic infarction (M4-M6 cortex): 3 Total score (0-10 with 10 being normal): 10 IMPRESSION: 1. No acute intracranial abnormality. 2. ASPECTS is 10. 3. Age-related cerebral atrophy with moderate chronic microvascular ischemic disease. These results were communicated to Dr. Wilford Corner at 9:21 pm on 06/24/2023 by text page via the [facility] messaging system. Electronically Signed   By: Rise Mu M.D.   On: 06/24/2023 21:24    Pending Labs Unresulted Labs (From admission, onward)     Start     Ordered   06/25/23 0500  Lipid panel  Tomorrow morning,   R        06/25/23 0335            Vitals/Pain Today's Vitals   06/25/23 1040 06/25/23 1100 06/25/23 1230 06/25/23 1246  BP: 132/66 107/70 127/81   Pulse: 86 87 92   Resp: 17 19 15    Temp:    98.6 F (37 C)  TempSrc:    Oral  SpO2: 100% 98% 98%   Weight:      Height:      PainSc:        Isolation Precautions No active isolations  Medications Medications  rosuvastatin (CRESTOR) tablet 20 mg (20 mg Oral Given 06/25/23 0029)  LORazepam (ATIVAN) tablet 0.5 mg (0.5 mg Oral Given 06/25/23 0835)  insulin aspart (novoLOG) injection 8 Units (8 Units Subcutaneous Given 06/25/23 1246)  polyethylene glycol (MIRALAX / GLYCOLAX) packet 17 g (17 g Oral Not Given 06/25/23 1034)  insulin glargine-yfgn (SEMGLEE) injection 10 Units (10 Units Subcutaneous Given 06/25/23 1033)  sodium chloride flush (NS) 0.9 % injection 3 mL (3 mLs Intravenous Given 06/25/23 1033)  sodium chloride flush (NS) 0.9 % injection 3 mL (has no administration in time range)  0.9 %  sodium chloride infusion (has no administration in time range)  acetaminophen (TYLENOL) tablet 650 mg (has no administration in time range)    Or  acetaminophen (TYLENOL) suppository 650 mg (has no administration in time range)  bisacodyl (DULCOLAX) EC tablet 5 mg (has no administration in time  range)  insulin aspart (novoLOG) injection 0-6 Units ( Subcutaneous Not Given 06/25/23 1139)  hydrALAZINE (APRESOLINE) injection 10 mg (has no administration in time range)   stroke: early stages of recovery book (has no administration in time range)  apixaban (ELIQUIS) tablet 2.5 mg (2.5 mg Oral Given 06/25/23 1033)  carvedilol (COREG) tablet 12.5 mg (12.5 mg Oral Given 06/25/23 0835)  feeding supplement (ENSURE ENLIVE / ENSURE PLUS) liquid 237 mL (237 mLs Oral Given 06/25/23 1047)  labetalol (NORMODYNE) injection 10 mg (10 mg Intravenous Given 06/24/23 2135)  hydrALAZINE (APRESOLINE) injection 10 mg (10 mg Intravenous Given 06/24/23 2133)  labetalol (NORMODYNE) injection 10 mg (10 mg Intravenous Given 06/25/23 0030)    Mobility walks with device-walker     Focused Assessments Neuro Assessment Handoff:  Swallow screen pass? Yes    NIH Stroke Scale  Dizziness Present: No Headache Present: No Interval: Other (Comment) Level of Consciousness (1a.)   : Alert, keenly responsive LOC Questions (1b. )   :  Answers both questions correctly LOC Commands (1c. )   : Performs both tasks correctly Best Gaze (2. )  : Normal Visual (3. )  : No visual loss Facial Palsy (4. )    : Normal symmetrical movements Motor Arm, Left (5a. )   : No drift Motor Arm, Right (5b. ) : No drift Motor Leg, Left (6a. )  : No drift Motor Leg, Right (6b. ) : No drift Limb Ataxia (7. ): Absent Sensory (8. )  : Normal, no sensory loss Best Language (9. )  : No aphasia Dysarthria (10. ): Normal Extinction/Inattention (11.)   : No Abnormality Complete NIHSS TOTAL: 0     Neuro Assessment: Exceptions to WDL Neuro Checks:   Initial (06/24/23 2122)  Has TPA been given? No If patient is a Neuro Trauma and patient is going to OR before floor call report to 4N Charge nurse: [phone] or [phone]   R Recommendations: See Admitting Provider Note  Report given to:   Additional Notes: Continent

## 2023-06-25 NOTE — Progress Notes (Signed)
OT Cancellation Note  Patient Details Name: Donna Green MRN: [id number] DOB: 19-Dec-1940   Cancelled Treatment:    Reason Eval/Treat Not Completed: Patient at procedure or test/ unavailable- per RN pt at MRI. Will follow and see as able.   Barry Brunner, OT Acute Rehabilitation Services Office [phone]   Chancy Milroy 06/25/2023, 9:32 AM

## 2023-06-25 NOTE — Progress Notes (Signed)
PROGRESS NOTE  MATTILYNN WHITCOMB WCB:[id number] DOB: Jul 30, 1941 DOA: 06/24/2023 PCP: Renford Dills, MD   LOS: 1 day   Brief Narrative / Interim history: 82 year old female with CKD 3B, HTN, recent A-fib on Eliquis, HTN, HLD, DM2, prior CVA comes into the hospital as a code stroke with facial droop and difficulty speaking, as well as confusion. Neurology consulted  Subjective / 24h Interval events: Doing well this morning, denies any further deficits.  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  Neurology was consulted and followed patient while hospitalized.  On MRI was without LVO.  2D echo was not done during this hospitalization as it was just completed in the last 3 weeks on 06/02/2023, showing LVEF 65-70% without WMA.  RV was normal.  LDL was 33.  A1c was done few weeks ago and it was 7.7.  PT recommends home health.  Neurology recommends to continue Eliquis.  Vascular ultrasound of the carotids without significant plaque  Active problems PAF-rate controlled on carvedilol, dose increased by neurology this morning due to hypertension.  Continue Eliquis, pharmacy recommends 2.5 mg twice daily and due to her renal function  Essential hypertension -patient was recently admitted at the end of October with a syncopal episode, found to have AF with RVR as  well as significant orthostatic hypotension.  Before that hospitalization she was amlodipine 10 mg, losartan 25 and Lopressor 100.  She was discharged only on Coreg.  During this admission she presented with significant significant blood pressure elevation, and neurology just increase her Coreg.  Blood pressure this afternoon fluctuates from 100 systolic to 170 systolic, will favor to continue to observe, recheck orthostatics tomorrow morning on the new Coreg dose and potentially home if stable  CKD 3B -baseline creatinine 1.7-2.1 for most part of 2024, currently better than baseline  Chronic diastolic CHF-euvolemic  IDDM-Home medications  Hyperlipidemia-continue statin  Hiatal hernia -asymptomatic, outpatient follow-up  GAD-continue home medications  Scheduled Meds:  [START ON 06/26/2023]  stroke: early stages of recovery book   Does not apply Once   apixaban  2.5 mg Oral BID   carvedilol  12.5 mg Oral BID WC   feeding supplement  237 mL Oral BID BM   insulin aspart  0-6 Units Subcutaneous TID WC   insulin aspart  8 Units Subcutaneous TID WC   insulin glargine-yfgn  10 Units Subcutaneous Daily   polyethylene glycol  17 g Oral Daily   rosuvastatin  20 mg Oral QHS   sodium chloride flush  3 mL Intravenous Q12H   Continuous Infusions:  sodium chloride     PRN Meds:.sodium chloride, acetaminophen **OR** acetaminophen, bisacodyl, hydrALAZINE, LORazepam, sodium chloride flush  Current Outpatient Medications  Medication Instructions   acetaminophen (TYLENOL) 650 mg, Oral, 2 times daily PRN   apixaban (ELIQUIS) 2.5 mg, Oral, 2 times daily   calcium carbonate (TUMS EX) 750 MG chewable  tablet 1 tablet, Oral, Daily PRN   carvedilol (COREG) 6.25 mg, Oral, 2 times daily with meals   insulin aspart (NOVOLOG FLEXPEN) 8 Units, Subcutaneous, 3 times daily with meals   insulin glargine (LANTUS) 20 Units, Subcutaneous, Daily at bedtime   LORazepam (ATIVAN) 0.5 mg, Oral, Every 8 hours PRN    polyethylene glycol (MIRALAX / GLYCOLAX) 17 g, Oral, Daily   rosuvastatin (CRESTOR) 20 mg, Oral, Daily at bedtime    Diet Orders (From admission, onward)     Start     Ordered   06/25/23 0333  Diet heart healthy/carb modified Fluid consistency: Thin; Fluid restriction: 2000 mL Fluid  Diet effective now       Comments: Please do bedside swallow screen first before initiating oral diet.  Question Answer Comment  Fluid consistency: Thin   Fluid restriction: 2000 mL Fluid      06/25/23 0333            DVT prophylaxis: apixaban (ELIQUIS) tablet 2.5 mg Start: 06/25/23 1000 SCDs Start: 06/24/23 2317 apixaban (ELIQUIS) tablet 2.5 mg   Lab Results  Component Value Date   PLT 321 06/25/2023      Code Status: Full Code  Family Communication: daughter at bedside  Status is: Observation  Level of care: Telemetry Medical  Consultants:  Neurology   Objective: Vitals:   06/25/23 1040 06/25/23 1100 06/25/23 1230 06/25/23 1246  BP: 132/66 107/70 127/81   Pulse: 86 87 92   Resp: 17 19 15    Temp:    98.6 F (37 C)  TempSrc:    Oral  SpO2: 100% 98% 98%   Weight:      Height:       No intake or output data in the 24 hours ending 06/25/23 1307 Wt Readings from Last 3 Encounters:  06/25/23 76.4 kg  06/10/23 76.9 kg  03/22/22 82.7 kg    Examination:  Constitutional: NAD Eyes: no scleral icterus ENMT: Mucous membranes are moist.  Neck: normal, supple Respiratory: clear to auscultation bilaterally, no wheezing, no crackles. Normal respiratory effort. No accessory muscle use.  Cardiovascular: Regular rate and rhythm, no murmurs / rubs / gallops. No LE edema.  Abdomen: non distended, no tenderness. Bowel sounds positive.  Musculoskeletal: no clubbing / cyanosis.   Data Reviewed: I have independently reviewed following labs and imaging studies   CBC Recent Labs  Lab 06/24/23 2109 06/24/23 2112 06/25/23 0335  WBC 8.9  --  8.5  HGB 10.5* 11.9* 10.0*  HCT 33.7* 35.0*  31.5*  PLT 320  --  321  MCV 104.0*  --  102.3*  MCH 32.4  --  32.5  MCHC 31.2  --  31.7  RDW 13.5  --  13.6  LYMPHSABS 2.2  --   --   MONOABS 1.2*  --   --   EOSABS 0.1  --   --   BASOSABS 0.0  --   --     Recent Labs  Lab 06/24/23 2109 06/24/23 2112 06/25/23 0335  NA 136 140 139  K 3.5 3.6 3.6  CL 103 103 105  CO2 24  --  23  GLUCOSE 136* 130* 157*  BUN 14 16 15   CREATININE 1.68* 1.70* 1.46*  CALCIUM 8.6*  --  8.3*  AST 21  --  19  ALT 14  --  12  ALKPHOS 77  --  70  BILITOT 0.6  --  0.7  ALBUMIN 2.4*  --  2.2*  INR 1.4*  --   --     ------------------------------------------------------------------------------------------------------------------  Recent Labs    06/25/23 0335  CHOL 84  HDL 38*  LDLCALC 33  TRIG 64  CHOLHDL 2.2    Lab Results  Component Value Date   HGBA1C 7.7 (H) 06/03/2023   ------------------------------------------------------------------------------------------------------------------ No results for input(s): "TSH", "T4TOTAL", "T3FREE", "THYROIDAB" in the last 72 hours.  Invalid input(s): "FREET3"  Cardiac Enzymes No results for input(s): "CKMB", "TROPONINI", "MYOGLOBIN" in the last 168 hours.  Invalid input(s): "CK" ------------------------------------------------------------------------------------------------------------------ No results found for: "BNP"  CBG: Recent Labs  Lab 06/25/23 0759 06/25/23 1135  GLUCAP 122* 144*    No results found for this or any previous visit (from the past 240 hour(s)).   Radiology Studies: MR ANGIO HEAD WO CONTRAST  Result Date: 06/25/2023 CLINICAL DATA:  Follow-up stroke. Chronic kidney disease. Hypertension. Atrial fibrillation. EXAM: MRA HEAD WITHOUT CONTRAST TECHNIQUE: Angiographic images of the Circle of Willis were acquired using MRA technique without intravenous contrast. COMPARISON:  Brain MRI yesterday. FINDINGS: Anterior circulation: Both internal carotid arteries are patent  through the skull base and siphon regions. There is atherosclerotic narrowing and irregularity of the siphon regions, more on the right than the left. 50% stenosis in the right siphon. No measurable stenosis in the left siphon. On the left, the ICA supplies the middle cerebral artery and both anterior cerebral arteries. On the right, the ICA supplies only the middle cerebral artery. No evidence of large vessel occlusion or proximal circle-of-Willis stenosis. No aneurysm or vascular malformation. Posterior circulation: Both vertebral arteries widely patent to the basilar artery. No basilar stenosis. Posterior circulation branch vessels are patent. There is moderate atherosclerotic narrowing and irregularity of the proximal posterior cerebral arteries, slightly more on the left than the right. Poor flow in the right superior cerebellar artery compared to the left. Anatomic variants: None other significant. Other: None. IMPRESSION: 1. No evidence of large vessel occlusion or proximal Circle of Willis stenosis. 2. Atherosclerotic narrowing and irregularity of the siphon regions, more on the right than the left. 50% stenosis in the right siphon. 3. Moderate atherosclerotic narrowing and irregularity of the proximal posterior cerebral arteries, slightly more on the left than the right. Poor flow in the right superior cerebellar artery compared to the left. Electronically Signed   By: Paulina Fusi M.D.   On: 06/25/2023 10:29   MR BRAIN WO CONTRAST  Result Date: 06/25/2023 CLINICAL DATA:  Initial evaluation for neuro deficit, stroke suspected. EXAM: MRI HEAD WITHOUT CONTRAST TECHNIQUE: Multiplanar, multiecho pulse sequences of the brain and surrounding structures were obtained without intravenous contrast. COMPARISON:  Prior CT from earlier the same day. FINDINGS: Brain: Examination moderately degraded by motion artifact. Cerebral volume within normal limits. Patchy T2/FLAIR hyperintensity involving the periventricular  white matter, most characteristic of chronic microvascular ischemic disease, mild for age. Two distinct punctate foci of diffusion signal abnormality are seen involving the subcortical right frontal lobe (series 2, image 40) as well as the subcortical left frontoparietal region (series 2, image 42). No associated hemorrhage or mass effect. Findings consistent with small acute to early subacute small vessel type ischemic infarcts. No associated hemorrhage or mass effect. No other evidence for acute or subacute ischemia. No acute intracranial hemorrhage. Single punctate chronic microhemorrhage noted at the left occipital lobe, of doubtful significance in isolation. No mass lesion, midline shift or mass effect. No hydrocephalus or extra-axial fluid collection. Pituitary gland suprasellar region within normal limits. Vascular: Major intracranial vascular flow voids are maintained. Skull and upper cervical spine: Craniocervical junction within normal limits. Bone marrow  signal intensity normal. No scalp soft tissue abnormality. Sinuses/Orbits: Prior ocular lens replacement on the right. Paranasal sinuses are largely clear. No mastoid effusion. Other: None. IMPRESSION: 1. Two punctate acute to early subacute small vessel type ischemic infarcts involving the subcortical right frontal lobe and left frontoparietal region. No associated hemorrhage or mass effect. 2. Underlying chronic microvascular ischemic disease, mild for age. Electronically Signed   By: Rise Mu M.D.   On: 06/25/2023 02:34   CT HEAD CODE STROKE WO CONTRAST  Result Date: 06/24/2023 CLINICAL DATA:  Code stroke. Initial evaluation for altered mental status, right facial droop. EXAM: CT HEAD WITHOUT CONTRAST TECHNIQUE: Contiguous axial images were obtained from the base of the skull through the vertex without intravenous contrast. RADIATION DOSE REDUCTION: This exam was performed according to the departmental dose-optimization program which  includes automated exposure control, adjustment of the mA and/or kV according to patient size and/or use of iterative reconstruction technique. COMPARISON:  Prior study from 06/02/2023. FINDINGS: Brain: Age-related cerebral atrophy with moderately advanced chronic microvascular ischemic disease. Remote lacunar infarct at the posterior limb of the right internal capsule. No acute intracranial hemorrhage. No acute large vessel territory infarct. No mass lesion or midline shift. No hydrocephalus or extra-axial fluid collection. Vascular: No abnormal hyperdense vessel. Calcified atherosclerosis present at the skull base. Skull: Scalp soft tissues and calvarium within normal limits. Sinuses/Orbits: Globes and orbital soft tissues within normal limits. Paranasal sinuses are largely clear. Few small osteomas noted about the left ethmoidal air cells. Mastoid air cells are clear. Other: None. ASPECTS [facility] Stroke Program Early CT Score) - Ganglionic level infarction (caudate, lentiform nuclei, internal capsule, insula, M1-M3 cortex): 7 - Supraganglionic infarction (M4-M6 cortex): 3 Total score (0-10 with 10 being normal): 10 IMPRESSION: 1. No acute intracranial abnormality. 2. ASPECTS is 10. 3. Age-related cerebral atrophy with moderate chronic microvascular ischemic disease. These results were communicated to Dr. Wilford Corner at 9:21 pm on 06/24/2023 by text page via the [facility] messaging system. Electronically Signed   By: Rise Mu M.D.   On: 06/24/2023 21:24     Pamella Pert, MD, PhD [facility]  Between 7 am - 7 pm I am available, please contact me via Amion (for emergencies) or Securechat (non urgent messages)  Between 7 pm - 7 am I am not available, please contact night coverage MD/APP via Amion

## 2023-06-25 NOTE — Plan of Care (Signed)
MRI brain completed.  2 punctate acute to early subacute small vessel type ischemic infarcts in the subcortical right frontal lobe and left frontoparietal region seen.  No associated hemorrhage or mass effect.  Underlying chronic microvascular disease mild for age is also seen.  Her presentation could have been from these small strokes which seem to be either consistent with concomitant small vessel disease versus cardioembolic source.  Impression: Acute ischemic infarct, hypertensive emergency  Updated recommendations Continue frequent neurochecks Telemetry On Eliquis for atrial fibrillation-strokes are very small in size, I would continue Eliquis for now. No need to repeat 2D echocardiogram Check lipid panel.  High intensity statin for a goal LDL less than 70. A1c was 7.7 and October 2024-goal A1c less than 7.  No need to repeat for now. PT OT speech therapy Given that she is on Eliquis, I would allow permissive hypertension but again would keep the goal lower than the standard goal of 220 systolic.  I would recommend permissive hypertension up to systolic 180.  Treat as needed if greater than 180. Stroke team to follow  -- Milon Dikes, MD Neurologist [facility]

## 2023-06-25 NOTE — Progress Notes (Addendum)
STROKE TEAM PROGRESS NOTE   BRIEF HPI Donna Green is a 82 y.o. female  has a past medical history of Anxiety (03/23/2022), Chronic kidney disease, Diabetes mellitus without complication (HCC), Headache, Hyperlipidemia, Hypertension, Right hemiparesis (HCC), Small bowel obstruction (HCC), and Stroke (HCC).  Recent diagnosis of paroxysmal A-fib with RVR started on Eliquis, , who presented with complaints of transient right facial weakness, slurred speech and altered mental status.   LKW: Little before 8:15 PM on 06/24/2023 Modified rankin score: 2-Slight disability-UNABLE to perform all activities but does not need assistance IV Thrombolysis: On Eliquis-last dose within 24 hours EVT: Clinical exam not consistent with LVO  SIGNIFICANT HOSPITAL EVENTS MRI:  1.Two punctate acute to early subacute small vessel type ischemic infarcts involving the subcortical right frontal lobe and left frontoparietal region. No associated hemorrhage or mass effect. 2. Underlying chronic microvascular ischemic disease, mild for age.  INTERIM HISTORY/SUBJECTIVE  Daughter beside for support and contributes to history. Last known well time little before 8:15 PM on 11/11 when she had given her food and upon return the patient had slurred speech, right sided facial droop and confusion. Reports a prior history of strokes and follows with Dr. Pearlean Brownie outpatient. Prior stroke in the 90s, 2001 and 2007. Has had residual hearing difficulty and chronic right sided extremity weakness. She was started on eliquis 2.5 mg BID for her afib in October and is compliant with medications. At that time, the patient feel and resulted in a lumbar compression fracture. Home medications were adjusted and a lot of her hypertensive medications were discontinued. Patients blood pressure was dropping during therapy sessions and they suspected ortho stasis.    At baseline patient can perform some ADLs (dress, walk to bathroom w/ walker). Lives with  daughter and granddaughter who helps with other iADLs. Denies any recent falls, headaches, or syncopal episodes.    OBJECTIVE  CBC    Component Value Date/Time   WBC 8.5 06/25/2023 0335   RBC 3.08 (L) 06/25/2023 0335   HGB 10.0 (L) 06/25/2023 0335   HGB 12.4 10/13/2022 1252   HGB 12.8 12/04/2013 1046   HCT 31.5 (L) 06/25/2023 0335   HCT 39.3 12/04/2013 1046   PLT 321 06/25/2023 0335   PLT 207 10/13/2022 1252   PLT 256 12/04/2013 1046   MCV 102.3 (H) 06/25/2023 0335   MCV 95.8 12/04/2013 1046   MCH 32.5 06/25/2023 0335   MCHC 31.7 06/25/2023 0335   RDW 13.6 06/25/2023 0335   RDW 14.8 (H) 12/04/2013 1046   LYMPHSABS 2.2 06/24/2023 2109   LYMPHSABS 2.4 12/04/2013 1046   MONOABS 1.2 (H) 06/24/2023 2109   MONOABS 0.6 12/04/2013 1046   EOSABS 0.1 06/24/2023 2109   EOSABS 0.3 12/04/2013 1046   BASOSABS 0.0 06/24/2023 2109   BASOSABS 0.1 12/04/2013 1046    BMET    Component Value Date/Time   NA 139 06/25/2023 0335   NA 141 12/04/2013 1047   K 3.6 06/25/2023 0335   K 3.8 12/04/2013 1047   CL 105 06/25/2023 0335   CO2 23 06/25/2023 0335   CO2 22 12/04/2013 1047   GLUCOSE 157 (H) 06/25/2023 0335   GLUCOSE 110 12/04/2013 1047   BUN 15 06/25/2023 0335   BUN 26.1 (H) 12/04/2013 1047   CREATININE 1.46 (H) 06/25/2023 0335   CREATININE 1.70 (H) 10/13/2022 1252   CREATININE 1.6 (H) 12/04/2013 1047   CALCIUM 8.3 (L) 06/25/2023 0335   CALCIUM 9.9 12/04/2013 1047   GFRNONAA 35 (L) 06/25/2023 0335  GFRNONAA 30 (L) 10/13/2022 1252    IMAGING past 24 hours MR BRAIN WO CONTRAST  Result Date: 06/25/2023 CLINICAL DATA:  Initial evaluation for neuro deficit, stroke suspected. EXAM: MRI HEAD WITHOUT CONTRAST TECHNIQUE: Multiplanar, multiecho pulse sequences of the brain and surrounding structures were obtained without intravenous contrast. COMPARISON:  Prior CT from earlier the same day. FINDINGS: Brain: Examination moderately degraded by motion artifact. Cerebral volume within normal  limits. Patchy T2/FLAIR hyperintensity involving the periventricular white matter, most characteristic of chronic microvascular ischemic disease, mild for age. Two distinct punctate foci of diffusion signal abnormality are seen involving the subcortical right frontal lobe (series 2, image 40) as well as the subcortical left frontoparietal region (series 2, image 42). No associated hemorrhage or mass effect. Findings consistent with small acute to early subacute small vessel type ischemic infarcts. No associated hemorrhage or mass effect. No other evidence for acute or subacute ischemia. No acute intracranial hemorrhage. Single punctate chronic microhemorrhage noted at the left occipital lobe, of doubtful significance in isolation. No mass lesion, midline shift or mass effect. No hydrocephalus or extra-axial fluid collection. Pituitary gland suprasellar region within normal limits. Vascular: Major intracranial vascular flow voids are maintained. Skull and upper cervical spine: Craniocervical junction within normal limits. Bone marrow signal intensity normal. No scalp soft tissue abnormality. Sinuses/Orbits: Prior ocular lens replacement on the right. Paranasal sinuses are largely clear. No mastoid effusion. Other: None. IMPRESSION: 1. Two punctate acute to early subacute small vessel type ischemic infarcts involving the subcortical right frontal lobe and left frontoparietal region. No associated hemorrhage or mass effect. 2. Underlying chronic microvascular ischemic disease, mild for age. Electronically Signed   By: Rise Mu M.D.   On: 06/25/2023 02:34   CT HEAD CODE STROKE WO CONTRAST  Result Date: 06/24/2023 CLINICAL DATA:  Code stroke. Initial evaluation for altered mental status, right facial droop. EXAM: CT HEAD WITHOUT CONTRAST TECHNIQUE: Contiguous axial images were obtained from the base of the skull through the vertex without intravenous contrast. RADIATION DOSE REDUCTION: This exam was  performed according to the departmental dose-optimization program which includes automated exposure control, adjustment of the mA and/or kV according to patient size and/or use of iterative reconstruction technique. COMPARISON:  Prior study from 06/02/2023. FINDINGS: Brain: Age-related cerebral atrophy with moderately advanced chronic microvascular ischemic disease. Remote lacunar infarct at the posterior limb of the right internal capsule. No acute intracranial hemorrhage. No acute large vessel territory infarct. No mass lesion or midline shift. No hydrocephalus or extra-axial fluid collection. Vascular: No abnormal hyperdense vessel. Calcified atherosclerosis present at the skull base. Skull: Scalp soft tissues and calvarium within normal limits. Sinuses/Orbits: Globes and orbital soft tissues within normal limits. Paranasal sinuses are largely clear. Few small osteomas noted about the left ethmoidal air cells. Mastoid air cells are clear. Other: None. ASPECTS [facility] Stroke Program Early CT Score) - Ganglionic level infarction (caudate, lentiform nuclei, internal capsule, insula, M1-M3 cortex): 7 - Supraganglionic infarction (M4-M6 cortex): 3 Total score (0-10 with 10 being normal): 10 IMPRESSION: 1. No acute intracranial abnormality. 2. ASPECTS is 10. 3. Age-related cerebral atrophy with moderate chronic microvascular ischemic disease. These results were communicated to Dr. Wilford Corner at 9:21 pm on 06/24/2023 by text page via the [facility] messaging system. Electronically Signed   By: Rise Mu M.D.   On: 06/24/2023 21:24    Vitals:   06/25/23 0145 06/25/23 0300 06/25/23 0415 06/25/23 0700  BP: (!) 159/80 (!) 178/89 (!) 150/83 (!) 180/89  Pulse: 97 98 96  98  Resp: 17 12 16  (!) 22  Temp:   98.9 F (37.2 C)   TempSrc:      SpO2: 99% 100% 100% 100%  Weight:      Height:         PHYSICAL EXAM General:  Alert, well-nourished, well-developed patient in no acute distress, laying in bed  Psych:   Mood and affect appropriate for situation CV: Regular rate and rhythm on monitor Respiratory:  Regular, unlabored respirations on room air GI: Abdomen soft and nontender   NEURO:  Mental Status: AA& , patient is able to give clear and coherent history Speech/Language: speech is without dysarthria or aphasia.   Cranial Nerves:  II: PERRL. Visual fields full.  III, IV, VI: EOMI. Eyelids elevate symmetrically.  V: Sensation is intact to light touch and symmetrical to face.  VII: Face is symmetrical resting and smiling VIII: hearing intact to voice. IX, X: Palate elevates symmetrically. Phonation is normal.  ZO:[id number] shrug 4/5 on right  XII: tongue is midline without fasciculations. Motor: Right hemiparesis  Tone: is normal and bulk is normal Sensation-  Decreased in left lower extremity and right upper extremity    Coordination: FTN intact bilaterally, delayed on R > L., No drift.  Gait- deferred  ASSESSMENT/PLAN  Acute/subacute small vessel type ischemic infarcts in the subcortical right frontal lobe and left frontoparietal Chronic microvascular disease  Etiology: chronic small vessel disease vs cardio embolic source  Code Stroke CT head No acute abnormality. Age related cerebral atrophy w/ moderate chronic microvascular ischemic disease ASPECTS 10.    MRI  2 punctate acute to early subacute small vessel type ischemic infarcts in the subcortical right frontal lobe and left frontoparietal region seen.  MRA  no LVO, atherosclerotic narrowing and irregularity of siphon region right > left. Moderate atherosclerotic narrowing and irregularity at proximal PCAs left > right poor flow in the R superior cerebellar artery compared to left  Carotid Doppler unremarkable 2D Echo 06/02/23 EF 65-70%, w severe asymmetrical LVH  LDL 33 HgbA1c 7.7 VTE prophylaxis - SCDs and Eliquis  Eliquis (apixaban) 2.5 mg BID prior to admission, and will continue on home eliquis regimen, discussed dosing with  pharmacy and given CKD patient on appropriate dose.  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  Has residual hearing difficulty and chronic right sided extremity weakness. Follows with Dr. Pearlean Brownie, outpatient  Can perform some ADLs and requires assistance with iADLs   Atrial fibrillation with intermittent RVR Home Meds: Eliquis 2.5 mg BID  Continue home eliquis, discussed dosing with pharmacy and recommends current dose based on kidney function, Cr ~ 1.5 or higher chronically  Increased coreg from 6.25 to 12.5  Hypertension Hx of orthostatic hypotension Home meds:  carvedilol 6.25 mg BID  Increased carvedilol 12.5 mg BID  Unstable on the high end Ordered TED hose Long term BP goal normotensive  Hyperlipidemia Home meds:  crestor 20 mg daily resumed in hospital LDL 33, goal < 70 Continue statin at discharge  Diabetes type II uncontrolled Home meds:  Insulin aspart 8 units TID w/ meals and 20 units Lantus at bedtime  HgbA1c 7.7, goal < 7.0 CBGs SSI Insulin aspart 8 units TID w/ meals and semglee 10 units per primary team  Recommend close follow-up with PCP for better DM control  Other Stroke Risk Factors Advance age  CAD  Other Active Problems CKD stage IV creatine baseline 1.7-2.2,  continue monitor with labs  Anxiety  MGUS  Hospital day # 1   ATTENDING NOTE: I reviewed above note and agree with the assessment and plan. Pt was seen and examined.   Daughter at bedside.  Patient lying in bed, hard of hearing, however awake alert orientated.  Still has mild right facial droop, mild dysarthria, slight weakness on the right upper extremity.  MRI showed 2 punctate frontal stroke bilaterally, still consistent with cardioembolic source.  Patient has A-fib RVR and recently started on Eliquis 2.5 twice daily.  Patient creatinine fluctuate although today less than 1.5, however most times creatinine more than  1.5, discussed with pharmacy, continue at 1.5 twice daily at this time.  Continue statin.  BP on the high end, increase Coreg from 6.5 to 12.5.  Patient had history of orthostatic hypotension, older TED hose.  PT recommend home health.  For detailed assessment and plan, please refer to above/below as I have made changes wherever appropriate.   Marvel Plan, MD PhD Stroke Neurology 06/25/2023 2:42 PM    To contact Stroke Continuity provider, please refer to [email or web address]. After hours, contact General Neurology

## 2023-06-25 NOTE — Evaluation (Signed)
Occupational Therapy Evaluation/Discharge Patient Details Name: Donna Green MRN: [id number] DOB: 1940-12-13 Today's Date: 06/25/2023   History of Present Illness Pt is 82 yo female who presents on 06/24/23 with AMS, trouble speaking and facial droop.  Found to have BP 255/105 and MRI showing acute to early subacute small vessel ischemic infarcts R frontal and L frontoparietal.  PMH: recent fall with L1 comp fx, DM, HLD, HTN, CKD, HOH, PAF on Eliquis, large HH, GAD and CVA x2 with residual R hemiparesis.   Clinical Impression   Patient evaluated by Occupational Therapy with no further acute OT needs identified. All education has been completed and the patient has no further questions. Prior to admit, pt was living at home with daughter who is available 24/7 to provide assistance. Daughter helped pt step in and out of tub/shower and bath while seated on BSC. Daughter reports that pt is slower moving and not as quick cognitively as she normally is at baseline. Pt is very close to baseline. I do not foresee any follow-up Occupational Therapy or equipment needs. Pt is recommended for home health PT and mobility team will work with pt while she is at [facility]. OT is signing off. Thank you for this referral.        If plan is discharge home, recommend the following: A little help with walking and/or transfers;A little help with bathing/dressing/bathroom;Direct supervision/assist for medications management;Direct supervision/assist for financial management;Assist for transportation;Help with stairs or ramp for entrance;Supervision due to cognitive status    Functional Status Assessment  Patient has not had a recent decline in their functional status  Equipment Recommendations  None recommended by OT       Precautions / Restrictions Precautions Precautions: Fall;Back Precaution Comments: HOH Spinal Brace: Thoracolumbosacral orthotic (daughter reports brace at home) Restrictions Weight Bearing  Restrictions: No      Mobility Bed Mobility Overal bed mobility: Needs Assistance Bed Mobility: Rolling, Sidelying to Sit Rolling: Min assist, Used rails Sidelying to sit: Min assist, HOB elevated, Used rails       General bed mobility comments: Daughter reports that they do not perform log rolling technique at home during bed mobility. VC for technique with education on sequencing log rolling technique to maintain back precautions    Transfers Overall transfer level: Needs assistance Equipment used: Rolling walker (2 wheels) Transfers: Sit to/from Stand, Bed to chair/wheelchair/BSC Sit to Stand: Contact guard assist, From elevated surface     Step pivot transfers: Contact guard assist     General transfer comment: VC for hand placement with RW management prior to sit<>stand transition. Pt demonstrated slow motor movement and slow processing speed.      Balance Overall balance assessment: Mild deficits observed, not formally tested          ADL either performed or assessed with clinical judgement   ADL Overall ADL's : Needs assistance/impaired Eating/Feeding: Set up;Sitting   Grooming: Set up;Sitting   Upper Body Bathing: Set up;Sitting   Lower Body Bathing: Contact guard assist;Sitting/lateral leans;Sit to/from stand   Upper Body Dressing : Set up;Sitting   Lower Body Dressing: Minimal assistance;Sit to/from stand   Toilet Transfer: Contact guard assist;Cueing for safety;Stand-pivot;BSC/3in1;Rolling walker (2 wheels)   Toileting- Clothing Manipulation and Hygiene: Minimal assistance;Cueing for back precautions;Sit to/from stand         Vision Baseline Vision/History: 1 Wears glasses Ability to See in Adequate Light: 0 Adequate Patient Visual Report: No change from baseline Vision Assessment?: No apparent visual deficits  Perception Perception: Not tested       Praxis Praxis: Not tested       Pertinent Vitals/Pain Pain Assessment Pain  Assessment: No/denies pain     Extremity/Trunk Assessment Upper Extremity Assessment Upper Extremity Assessment: Overall WFL for tasks assessed   Lower Extremity Assessment Lower Extremity Assessment: Generalized weakness   Cervical / Trunk Assessment Cervical / Trunk Assessment: Kyphotic   Communication Communication Communication: Hearing impairment Cueing Techniques: Verbal cues;Gestural cues;Tactile cues;Visual cues   Cognition Arousal: Alert Behavior During Therapy: Flat affect Overall Cognitive Status: History of cognitive impairments - at baseline      Orientation Level: Person, Place, Time, Situation Current Attention Level: Sustained   Following Commands: Follows one step commands consistently, Follows one step commands with increased time     Problem Solving: Slow processing, Decreased initiation, Requires verbal cues, Requires tactile cues General Comments: Hearing impairment may have elevated appearance of cognitive issues. Pt responded to daughter's voice more due to familiarity.     General Comments  VSS on RA            Home Living Family/patient expects to be discharged to:: Private residence Living Arrangements: Children (adult daughter) Available Help at Discharge: Available 24 hours/day Type of Home: House Home Access: Stairs to enter [employer] of Steps: 2 Entrance Stairs-Rails: None Home Layout: Multi-level Alternate Level Stairs-Number of Steps: 3 Alternate Level Stairs-Rails: Right Bathroom Shower/Tub: Chief Strategy Officer: Standard Bathroom Accessibility: Yes How Accessible: Accessible via walker Home Equipment: Rolling Walker (2 wheels);BSC/3in1;Hand held shower head   Additional Comments: Uses BSC in tub/shower(in daughter's bathroom). Pt has walk in shower in her bathroom      Prior Functioning/Environment Prior Level of Function : Needs assist       Physical Assist : ADLs (physical);Mobility  (physical) Mobility (physical): Transfers;Bed mobility;Stairs;Gait ADLs (physical): Bathing;IADLs Mobility Comments: walking with walker, some help at times ADLs Comments: daughter helps get into shower and pt showers        OT Problem List: Decreased strength;Impaired balance (sitting and/or standing);Decreased safety awareness;Decreased cognition         OT Goals(Current goals can be found in the care plan section) Acute Rehab OT Goals Patient Stated Goal: to go home  OT Frequency:  1X visit       AM-PAC OT "6 Clicks" Daily Activity     Outcome Measure Help from another person eating meals?: A Little Help from another person taking care of personal grooming?: A Little Help from another person toileting, which includes using toliet, bedpan, or urinal?: A Little Help from another person bathing (including washing, rinsing, drying)?: A Little Help from another person to put on and taking off regular upper body clothing?: A Little Help from another person to put on and taking off regular lower body clothing?: A Little 6 Click Score: 18   End of Session Equipment Utilized During Treatment: Gait belt;Rolling walker (2 wheels) Nurse Communication: Mobility status  Activity Tolerance: Patient tolerated treatment well Patient left: in chair;with call bell/phone within reach;with chair alarm set  OT Visit Diagnosis: Muscle weakness (generalized) (M62.81)                Time: 5409-8119 OT Time Calculation (min): 30 min Charges:  OT General Charges $OT Visit: 1 Visit OT Evaluation $OT Eval Low Complexity: 1 Low  Limmie Patricia, OTR/L,CBIS  Supplemental OT - [facility] and [facility] Secure Chat Preferred    Pablo Stauffer, Charisse March 06/25/2023, 8:49 PM

## 2023-06-25 NOTE — Care Management Obs Status (Signed)
MEDICARE OBSERVATION STATUS NOTIFICATION   Patient Details  Name: Donna Green MRN: [id number] Date of Birth: 1940/12/10   Medicare Observation Status Notification Given:  Yes    Oletta Cohn, RN 06/25/2023, 12:48 PM

## 2023-06-25 NOTE — ED Notes (Signed)
NAD noted at this time, pt resting in bed, respirations even and unlabored, skin warm and dry, bed in low position, call light within reach. Comfort measures offered. Will continue to monitor. Pt denies any new symptoms, reports headache and dizziness both have resolved.

## 2023-06-26 DIAGNOSIS — G9341 Metabolic encephalopathy: Secondary | ICD-10-CM | POA: Diagnosis not present

## 2023-06-26 DIAGNOSIS — I6340 Cerebral infarction due to embolism of unspecified cerebral artery: Secondary | ICD-10-CM | POA: Diagnosis not present

## 2023-06-26 DIAGNOSIS — I639 Cerebral infarction, unspecified: Secondary | ICD-10-CM | POA: Diagnosis not present

## 2023-06-26 LAB — BASIC METABOLIC PANEL
Anion gap: 10 (ref 5–15)
BUN: 22 mg/dL (ref 8–23)
CO2: 23 mmol/L (ref 22–32)
Calcium: 8.5 mg/dL — ABNORMAL LOW (ref 8.9–10.3)
Chloride: 105 mmol/L (ref 98–111)
Creatinine, Ser: 1.75 mg/dL — ABNORMAL HIGH (ref 0.44–1.00)
GFR, Estimated: 29 mL/min — ABNORMAL LOW (ref >60)
Glucose, Bld: 118 mg/dL — ABNORMAL HIGH (ref 70–99)
Potassium: 3.7 mmol/L (ref 3.5–5.1)
Sodium: 138 mmol/L (ref 135–145)

## 2023-06-26 LAB — MAGNESIUM: Magnesium: 1.2 mg/dL — ABNORMAL LOW (ref 1.7–2.4)

## 2023-06-26 LAB — GLUCOSE, CAPILLARY
Glucose-Capillary: 121 mg/dL — ABNORMAL HIGH (ref 70–99)
Glucose-Capillary: 160 mg/dL — ABNORMAL HIGH (ref 70–99)
Glucose-Capillary: 128 mg/dL — ABNORMAL HIGH (ref 70–99)

## 2023-06-26 MED ORDER — ATORVASTATIN CALCIUM 40 MG PO TABS: 40.0000 mg | ORAL_TABLET | Freq: Every day | ORAL | 1 refills | Status: AC

## 2023-06-26 MED ORDER — MAGNESIUM OXIDE 400 MG PO CAPS: 400.0000 mg | ORAL_CAPSULE | Freq: Every day | ORAL | 0 refills | Status: AC

## 2023-06-26 MED ORDER — MAGNESIUM SULFATE 4 GM/100ML IV SOLN
4.0000 g | Freq: Once | INTRAVENOUS | Status: DC
  Filled 2023-06-26: qty 100

## 2023-06-26 MED ORDER — POTASSIUM CHLORIDE CRYS ER 20 MEQ PO TBCR
40.0000 meq | EXTENDED_RELEASE_TABLET | Freq: Once | ORAL | Status: AC
  Administered 2023-06-26: 40 meq via ORAL
  Filled 2023-06-26: qty 2

## 2023-06-26 MED ORDER — CARVEDILOL 12.5 MG PO TABS: 12.5000 mg | ORAL_TABLET | Freq: Two times a day (BID) | ORAL | 1 refills | Status: DC

## 2023-06-26 MED ORDER — INSULIN GLARGINE 100 UNIT/ML ~~LOC~~ SOLN: 15.0000 [IU] | Freq: Every day | SUBCUTANEOUS | Status: DC

## 2023-06-26 NOTE — Care Management Obs Status (Signed)
MEDICARE OBSERVATION STATUS NOTIFICATION   Patient Details  Name: Donna Green MRN: [id number] Date of Birth: [birth year]   Medicare Observation Status Notification Given:  Yes    Kermit Balo, RN 06/26/2023, 11:06 AM

## 2023-06-26 NOTE — TOC Transition Note (Signed)
Transition of Care [facility]) - CM/SW Discharge Note   Patient Details  Name: Donna Green MRN: [id number] Date of Birth: 1941/02/11  [facility] CM/SW Contact:  Kermit Balo, RN Phone Number: 06/26/2023, 1:33 PM   Clinical Narrative:     Pt is discharging home with resumption of home health services through [facility]. Information on the AVS. [facility] will contact her for the next home visit. Pt has transportation home.  Final next level of care: Home w Home Health Services Barriers to Discharge: No Barriers Identified   Patient Goals and CMS Choice      Discharge Placement                         Discharge Plan and Services Additional resources added to the After Visit Summary for                            HH Arranged: RN, PT [facility] Agency: [facility] Date [facility] Agency Contacted: 06/26/23   Representative spoke with at [facility] Agency: Haywood Lasso  Social Determinants of Health (SDOH) Interventions SDOH Screenings   Food Insecurity: No Food Insecurity (06/25/2023)  Housing: Low Risk  (06/25/2023)  Transportation Needs: No Transportation Needs (06/25/2023)  Utilities: Not At Risk (06/25/2023)  Depression (PHQ2-9): Low Risk  (10/13/2022)  Tobacco Use: Low Risk  (06/24/2023)     Readmission Risk Interventions    06/10/2023   12:13 PM 03/26/2022    2:08 PM  Readmission Risk Prevention Plan  Transportation Screening Complete Complete  PCP or Specialist Appt within 5-7 Days Complete Complete  Home Care Screening Complete Complete  Medication Review (RN CM) Complete Complete

## 2023-06-26 NOTE — Progress Notes (Addendum)
STROKE TEAM PROGRESS NOTE   BRIEF HPI Donna Green is a 82 y.o. female  has a past medical history of Anxiety (03/23/2022), Chronic kidney disease, Diabetes mellitus without complication (HCC), Headache, Hyperlipidemia, Hypertension, Right hemiparesis (HCC), Small bowel obstruction (HCC), and Stroke (HCC).  Recent diagnosis of paroxysmal A-fib with RVR started on Eliquis, , who presented with complaints of transient right facial weakness, slurred speech and altered mental status.   LKW: Little before 8:15 PM on 06/24/2023 Modified rankin score: 2-Slight disability-UNABLE to perform all activities but does not need assistance IV Thrombolysis: On Eliquis-last dose within 24 hours EVT: Clinical exam not consistent with LVO  SIGNIFICANT HOSPITAL EVENTS MRI:  1.Two punctate acute to early subacute small vessel type ischemic infarcts involving the subcortical right frontal lobe and left frontoparietal region. No associated hemorrhage or mass effect. 2. Underlying chronic microvascular ischemic disease, mild for age.  INTERIM HISTORY/SUBJECTIVE  Patient well this morning. No complaints. Still some chronic right sided generalized weakness from residual stroke. Denies HA and vision difficulties. Anticipates going home today. Encouraged to continue taking Eliquis as prescribed and voiced understanding.   OBJECTIVE  CBC    Component Value Date/Time   WBC 8.5 06/25/2023 0335   RBC 3.08 (L) 06/25/2023 0335   HGB 10.0 (L) 06/25/2023 0335   HGB 12.4 10/13/2022 1252   HGB 12.8 12/04/2013 1046   HCT 31.5 (L) 06/25/2023 0335   HCT 39.3 12/04/2013 1046   PLT 321 06/25/2023 0335   PLT 207 10/13/2022 1252   PLT 256 12/04/2013 1046   MCV 102.3 (H) 06/25/2023 0335   MCV 95.8 12/04/2013 1046   MCH 32.5 06/25/2023 0335   MCHC 31.7 06/25/2023 0335   RDW 13.6 06/25/2023 0335   RDW 14.8 (H) 12/04/2013 1046   LYMPHSABS 2.2 06/24/2023 2109   LYMPHSABS 2.4 12/04/2013 1046   MONOABS 1.2 (H) 06/24/2023  2109   MONOABS 0.6 12/04/2013 1046   EOSABS 0.1 06/24/2023 2109   EOSABS 0.3 12/04/2013 1046   BASOSABS 0.0 06/24/2023 2109   BASOSABS 0.1 12/04/2013 1046    BMET    Component Value Date/Time   NA 138 06/26/2023 0738   NA 141 12/04/2013 1047   K 3.7 06/26/2023 0738   K 3.8 12/04/2013 1047   CL 105 06/26/2023 0738   CO2 23 06/26/2023 0738   CO2 22 12/04/2013 1047   GLUCOSE 118 (H) 06/26/2023 0738   GLUCOSE 110 12/04/2013 1047   BUN 22 06/26/2023 0738   BUN 26.1 (H) 12/04/2013 1047   CREATININE 1.75 (H) 06/26/2023 0738   CREATININE 1.70 (H) 10/13/2022 1252   CREATININE 1.6 (H) 12/04/2013 1047   CALCIUM 8.5 (L) 06/26/2023 0738   CALCIUM 9.9 12/04/2013 1047   GFRNONAA 29 (L) 06/26/2023 0738   GFRNONAA 30 (L) 10/13/2022 1252    IMAGING past 24 hours VAS US CAROTID  Result Date: 06/25/2023 Carotid Arterial Duplex Study Patient Name:  Donna Green  Date of Exam:   06/25/2023 Medical Rec #: [id number]       Accession #:    [id number] Date of Birth: 1940-09-29       Patient Gender: F Patient Age:   40 years Exam Location:  [facility] Procedure:      VAS US CAROTID Referring Phys: Scheryl Marten Thadd Apuzzo --------------------------------------------------------------------------------  Indications:       CVA. Risk Factors:      Hypertension, hyperlipidemia, Diabetes, prior CVA. Comparison Study:  No access to prior study Performing Technologist: Shona Simpson  Examination Guidelines: A complete evaluation includes B-mode imaging, spectral Doppler, color Doppler, and power Doppler as needed of all accessible portions of each vessel. Bilateral testing is considered an integral part of a complete examination. Limited examinations for reoccurring indications may be performed as noted.  Right Carotid Findings: +----------+--------+--------+--------+------------------+------------------+           PSV cm/sEDV cm/sStenosisPlaque DescriptionComments            +----------+--------+--------+--------+------------------+------------------+ CCA Prox  54      8                                                    +----------+--------+--------+--------+------------------+------------------+ CCA Distal57      12                                intimal thickening +----------+--------+--------+--------+------------------+------------------+ ICA Prox  50      16      1-39%   calcific                             +----------+--------+--------+--------+------------------+------------------+ ICA Distal79      16                                                   +----------+--------+--------+--------+------------------+------------------+ ECA       74      5                                                    +----------+--------+--------+--------+------------------+------------------+ +----------+--------+-------+--------+-------------------+           PSV cm/sEDV cmsDescribeArm Pressure (mmHG) +----------+--------+-------+--------+-------------------+ [id number]     0                                  +----------+--------+-------+--------+-------------------+ +---------+--------+--+--------+--+ VertebralPSV cm/s53EDV cm/s16 +---------+--------+--+--------+--+  Left Carotid Findings: +----------+--------+--------+--------+------------------+------------------+           PSV cm/sEDV cm/sStenosisPlaque DescriptionComments           +----------+--------+--------+--------+------------------+------------------+ CCA Prox  58      9                                                    +----------+--------+--------+--------+------------------+------------------+ CCA Distal66      15                                intimal thickening +----------+--------+--------+--------+------------------+------------------+ ICA Prox  94      24      1-39%   calcific                              +----------+--------+--------+--------+------------------+------------------+ ICA Distal80  26                                                   +----------+--------+--------+--------+------------------+------------------+ ECA       61      0                                                    +----------+--------+--------+--------+------------------+------------------+ +----------+--------+--------+--------+-------------------+           PSV cm/sEDV cm/sDescribeArm Pressure (mmHG) +----------+--------+--------+--------+-------------------+ Subclavian109     0                                   +----------+--------+--------+--------+-------------------+ +---------+--------+--+--------+--+ VertebralPSV cm/s50EDV cm/s11 +---------+--------+--+--------+--+   Summary: Right Carotid: Velocities in the right ICA are consistent with a 1-39% stenosis.                Non-hemodynamically significant plaque <50% noted in the CCA. Left Carotid: Velocities in the left ICA are consistent with a 1-39% stenosis.               Non-hemodynamically significant plaque <50% noted in the CCA. Vertebrals:  Bilateral vertebral arteries demonstrate antegrade flow. Subclavians: Normal flow hemodynamics were seen in bilateral subclavian              arteries. *See table(s) above for measurements and observations.  Electronically signed by Lemar Livings MD on 06/25/2023 at 6:57:30 PM.    Final     Vitals:   06/26/23 0357 06/26/23 0402 06/26/23 0724 06/26/23 1109  BP: (!) 164/105  137/67 136/74  Pulse: 96  96 94  Resp: 18  16 18   Temp: 99.3 F (37.4 C)  99.4 F (37.4 C)   TempSrc: Oral  Oral   SpO2: 98%  97% 94%  Weight:  70.7 kg    Height:         PHYSICAL EXAM General:  Alert, well-nourished, well-developed patient in no acute distress, laying in bed  Psych:  Mood and affect appropriate for situation CV: Regular rate and rhythm on monitor Respiratory:  Regular, unlabored respirations on  room air GI: Abdomen soft and nontender   NEURO:  Mental Status: AA& , patient is able to give clear and coherent history Speech/Language: speech is without dysarthria or aphasia.   Cranial Nerves:  II: PERRL. Visual fields full.  III, IV, VI: EOMI. Eyelids elevate symmetrically.  V: Sensation is intact to light touch and symmetrical to face.  VII: Face is symmetrical resting and smiling VIII: hearing intact to voice. IX, X: Palate elevates symmetrically. Phonation is normal.  UJ:[id number] shrug 4/5 on right  XII: tongue is midline without fasciculations. Motor: Right hemiparesis  Tone: is normal and bulk is normal Sensation-  Decreased in left lower extremity and right upper extremity    Coordination: FTN intact bilaterally, delayed on R > L., No drift.  Gait- deferred  ASSESSMENT/PLAN  Acute/subacute small vessel type ischemic infarcts in the subcortical right frontal lobe and left frontoparietal Chronic microvascular disease  Etiology: chronic small vessel disease vs cardio embolic source  Code Stroke CT head No acute abnormality. Age related cerebral atrophy w/  moderate chronic microvascular ischemic disease ASPECTS 10.    MRI  2 punctate acute to early subacute small vessel type ischemic infarcts in the subcortical right frontal lobe and left frontoparietal region seen.  MRA  no LVO, atherosclerotic narrowing and irregularity of siphon region right > left. Moderate atherosclerotic narrowing and irregularity at proximal PCAs left > right poor flow in the R superior cerebellar artery compared to left  Carotid Doppler unremarkable 2D Echo 06/02/23 EF 65-70%, w severe asymmetrical LVH  LDL 33 HgbA1c 7.7 VTE prophylaxis - SCDs and Eliquis  Eliquis (apixaban) 2.5 mg BID prior to admission, and will continue on home eliquis regimen.  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  Has residual hearing difficulty  and chronic right sided extremity weakness. Follows with Dr. Pearlean Brownie, outpatient  Can perform some ADLs and requires assistance with iADLs   Atrial fibrillation with intermittent RVR Home Meds: Eliquis 2.5 mg BID  Continue home eliquis, discussed dosing with pharmacy and recommends current dose based on kidney function, Cr ~ 1.5 or higher chronically  Increased coreg from 6.25 to 12.5  Hypertension Hx of orthostatic hypotension Home meds:  carvedilol 6.25 mg BID  Increased carvedilol 12.5 mg BID, during admission due to elevated BP   BP more stable this morning  Orthostatic vitals 127/71 lying -> 120/65 sitting -> 106/61 standing 31min->123/67 standing 3 min Ordered TED hose Long term BP goal normotensive  Hyperlipidemia Home meds:  crestor 20 mg daily resumed in hospital LDL 33, goal < 70 Continue statin at discharge  Diabetes type II uncontrolled Home meds:  Insulin aspart 8 units TID w/ meals and 20 units Lantus at bedtime  HgbA1c 7.7, goal < 7.0 CBGs SSI Insulin aspart 8 units TID w/ meals and semglee 10 units per primary team  Recommend close follow-up with PCP for better DM control  Other Stroke Risk Factors Advance age  CAD  Other Active Problems CKD stage IV creatine baseline 1.7-2.2, continue monitor with labs  Anxiety  MGUS  Hospital day # 1   ATTENDING NOTE: I reviewed above note and agree with the assessment and plan. Pt was seen and examined.   No family at the bedside. Pt lying in bed, no acute event overnight. And no complains. BP improved today. Orthostatic vitals showed decreased BP on standing at 0 min, but overall no significant change. On coreg. Continue eliquis 2.5 bid and statin. Aggressive risk factor modification. PT and OT recommend HH.   For detailed assessment and plan, please refer to above/below as I have made changes wherever appropriate.   Neurology will sign off. Please call with questions. Pt will follow up with stroke clinic NP at [facility]  in about 4 weeks. Thanks for the consult.   Marvel Plan, MD PhD Stroke Neurology 06/26/2023 9:58 PM     To contact Stroke Continuity provider, please refer to [email or web address]. After hours, contact General Neurology

## 2023-06-26 NOTE — Discharge Summary (Signed)
Physician Discharge Summary  DYLEN BOVEN YQM:[id number] DOB: 04/16/1941 DOA: 06/24/2023  PCP: Renford Dills, MD  Admit date: 06/24/2023 Discharge date: 06/26/2023  Time spent: 40 minutes  Recommendations for Outpatient Follow-up:  Follow outpatient CBC/CMP/mag (discharged with mag supplementation) Follow with neurology outpatient Follow blood pressures outpatient  Discharged on lipitor rather than crestor given CKD Watch for low blood sugars, lantus decreased to 15 units at discharge - adjust insulin regimen as needed   Discharge Diagnoses:  Principal Problem:   Acute metabolic encephalopathy Active Problems:   Essential hypertension   Paroxysmal atrial fibrillation (HCC)   Hypertensive emergency   Facial droop-resolved   Confusion   CKD stage 3b, GFR 30-44 ml/min (HCC)   Acute ischemic stroke (HCC)   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 MRI showed acute stroke.  Neurology recommended continuing eliquis.  Stable for discharge on 11/13.  See below for additional details   Hospital Course:  Assessment and Plan:  Acute CVA  11/11 MRI with 2 punctate acute to early subacute small vessel type ischemic infarcts involving the subcortical R frontal lobe and L frontoparietal region MRA without evidence of LVO or proximal circle of willis stenosis.  50%stenosis in R siphon.  Moderate atherosclerotic narrowing and irregularity of the proximal posterior cerebellar arteries  (L>R).  Poor flow in right superior cerebellar artery. Echo from 06/02/2023 with EF 65-70%, no RWMA (severe asymmetric LVH of basal septal segment) (not repeated during this admission since only recently performed) Carotid US with 1-39% stenosis bilaterally, bilateral antegrade flow in vertebral arteries LDL 33, A1c from 06/03/2023 was 7.7  Therapy recommending home health  Neurology suspects chronic small vessel disease vs cardioembolic source -  recommending eliquis and outpatient neuro follow up.    PAF Eliquis coreg   Essential hypertension Continue coreg at increased dose   CKD 3B -baseline creatinine 1.7-2.1 for most part of 2024 trend   Chronic diastolic CHF-euvolemic   IDDM-Home medications - lantus reduced to 15 units nightly (daughter notes low overnight as recently as last week)   Hyperlipidemia-continue statin -> transition to lipitor due to CKD    Hiatal hernia -asymptomatic, outpatient follow-up   GAD-continue home medications     Procedures:  Carotid US Summary:  Right Carotid: Velocities in the right ICA are consistent with Gildo Crisco 1-39%  stenosis.                Non-hemodynamically significant plaque <50% noted in the  CCA.   Left Carotid: Velocities in the left ICA are consistent with Emileo Semel 1-39%  stenosis.               Non-hemodynamically significant plaque <50% noted in the  CCA.   Vertebrals: Bilateral vertebral arteries demonstrate antegrade flow.  Subclavians: Normal flow hemodynamics were seen in bilateral subclavian               arteries.   Echo IMPRESSIONS     1. Difficult strain acquisition; consider future PYP testing  or CMR  testing. Left ventricular ejection fraction, by estimation, is 65 to 70%.  The left ventricle has normal function. The left ventricle has no regional  wall motion abnormalities. There is  severe asymmetric left ventricular hypertrophy of the basal-septal  segment. Left ventricular diastolic parameters are indeterminate.    2. Right ventricular systolic function is normal. The right ventricular  size is normal. Moderately increased right ventricular wall thickness.  There is mildly elevated pulmonary artery systolic pressure. The estimated  right ventricular systolic pressure   is 38.0 mmHg.   3. Ronia Hazelett small pericardial effusion is present. The pericardial effusion is  circumferential.   4. The mitral valve is degenerative. Trivial mitral valve regurgitation.  The mean mitral valve gradient is 3.7 mmHg with average heart rate of 83  bpm. Moderate mitral annular calcification.   5. Tricuspid valve regurgitation is mild to moderate.   6. The aortic valve was not well visualized. There is moderate  calcification of the aortic valve. Aortic valve regurgitation is not  visualized. Aortic valve sclerosis is present, with no evidence of aortic  valve stenosis.   7. The inferior vena cava is normal in size with greater than 50%  respiratory variability, suggesting right atrial pressure of 3 mmHg.  Consultations: neurology  Discharge Exam: Vitals:   06/26/23 0724 06/26/23 1109  BP: 137/67 136/74  Pulse: 96 94  Resp: 16 18  Temp: 99.4 F (37.4 C) 99.3 F (37.4 C)  SpO2: 97% 94%   No complaints Hard of hearing Discussed plan of care with daughter  General: No acute distress. Cardiovascular: RRR Lungs: unlabored Neurological: Alert and oriented 3. Moves all extremities 4 with equal strength. Cranial nerves II through XII intact. Extremities: No clubbing or cyanosis. No edema.  Discharge Instructions   Discharge Instructions     Ambulatory referral to Neurology   Complete by: As directed    Follow up with stroke clinic NP (Jessica Vanschaick or Darrol Angel, if both not available, consider Manson Allan, or Ahern) at [facility] in about 4 weeks. Thanks.   Call MD for:  difficulty breathing, headache or visual disturbances   Complete by: As directed    Call MD for:  extreme fatigue   Complete by: As  directed    Call MD for:  hives   Complete by: As directed    Call MD for:  persistant dizziness or light-headedness   Complete by: As directed    Call MD for:  persistant nausea and vomiting   Complete by: As directed    Call MD for:  redness, tenderness, or signs of infection (pain, swelling, redness, odor or green/yellow discharge around incision site)   Complete by: As directed    Call MD for:  severe uncontrolled pain   Complete by: As directed    Call MD for:  temperature >100.4   Complete by: As directed    Diet - low sodium heart healthy   Complete by: As directed    Discharge instructions   Complete by: As directed    You were seen for Stevi Hollinshead stroke.  You've been seen by neurology.    We've increased your coreg to 12.5 mg twice Jordy Hewins day for your blood pressure.  I'm transitioning you to lipitor due to your chronic kidney disease.   You should follow up with neurology in 4 weeks.   With your history of orthostatic hypotension, change positions slowly.  You can also wear compression stockings when you're upright.   Continue  home heath therapy.  I'll recommend decreasing your lantus to 15 units nightly given your prior history of lows.  Follow with your PCP to discuss further adjustment of your diabetes regimen.  Your magnesium levels are low.  We'll give you some prior to discharge and then give Kesley Gaffey prescription as well for home.  Ask your PCP to repeat labs after discharge.    Return for new, recurrent, or worsening symptoms.  Please ask your PCP to request records from this hospitalization so they know what was done and what the next steps will be.   Increase activity slowly   Complete by: As directed       Allergies as of 06/26/2023       Reactions   Codeine Other (See Comments)   Paranoid  Hallucinations    Dilaudid [hydromorphone] Anxiety   Hallucinations, agitation, paranoia    Morphine Anxiety   Hallucinations, paranoia, agitation   Colcrys [colchicine] Nausea  Only   Zyloprim [allopurinol] Nausea Only   Nsaids Other (See Comments)   Stage 3 kidney disease        Medication List     STOP taking these medications    rosuvastatin 20 MG tablet Commonly known as: CRESTOR       TAKE these medications    acetaminophen 325 MG tablet Commonly known as: TYLENOL Take 650 mg by mouth 2 (two) times daily as needed for mild pain (pain score 1-3) or moderate pain (pain score 4-6).   apixaban 2.5 MG Tabs tablet Commonly known as: ELIQUIS Take 1 tablet (2.5 mg total) by mouth 2 (two) times daily.   atorvastatin 40 MG tablet Commonly known as: Lipitor Take 1 tablet (40 mg total) by mouth daily.   calcium carbonate 750 MG chewable tablet Commonly known as: TUMS EX Chew 1 tablet by mouth daily as needed for heartburn.   carvedilol 12.5 MG tablet Commonly known as: COREG Take 1 tablet (12.5 mg total) by mouth 2 (two) times daily with Kanisha Duba meal. What changed:  medication strength how much to take   insulin glargine 100 UNIT/ML injection Commonly known as: Lantus Inject 0.15 mLs (15 Units total) into the skin at bedtime. What changed: how much to take   LORazepam 0.5 MG tablet Commonly known as: ATIVAN Take 1 tablet (0.5 mg total) by mouth every 8 (eight) hours as needed for anxiety. What changed: when to take this   Magnesium Oxide 400 MG Caps Take 1 capsule (400 mg total) by mouth daily.   NovoLOG FlexPen 100 UNIT/ML FlexPen Generic drug: insulin aspart Inject 8 Units into the skin 3 (three) times daily with meals.   polyethylene glycol 17 g packet Commonly known as: MIRALAX / GLYCOLAX Take 17 g by mouth daily. What changed:  when to take this reasons to take this       Allergies  Allergen Reactions   Codeine Other (See Comments)    Paranoid  Hallucinations    Dilaudid [Hydromorphone] Anxiety    Hallucinations, agitation, paranoia    Morphine Anxiety    Hallucinations, paranoia, agitation   Colcrys [Colchicine]  Nausea Only   Zyloprim [Allopurinol] Nausea Only   Nsaids Other (See Comments)    Stage 3 kidney disease    Follow-up Information     [facility]. Schedule an appointment as soon as possible for Gerrald Basu visit in 1 month(s).   Specialty: Neurology Why: stroke clinic Contact information: [address] [place] [address] [phone]  [facility] Follow up.   Specialty: Home Health Services Why: The home health agency will contact you for the first home visit. Contact information: [address] [place] [address] [phone]                  The results of significant diagnostics from this hospitalization (including imaging, microbiology, ancillary and laboratory) are listed below for reference.    Significant Diagnostic Studies: VAS US CAROTID  Result Date: 06/25/2023 Carotid Arterial Duplex Study Patient Name:  KHELSEY KEPLER  Date of Exam:   06/25/2023 Medical Rec #: [id number]       Accession #:    [id number] Date of Birth: Feb 19, 1941       Patient Gender: F Patient Age:   82 years Exam Location:  [facility] Procedure:      VAS US CAROTID Referring Phys: Scheryl Marten XU --------------------------------------------------------------------------------  Indications:       CVA. Risk Factors:      Hypertension, hyperlipidemia, Diabetes, prior CVA. Comparison Study:  No access to prior study Performing Technologist: Shona Simpson  Examination Guidelines: Araiya Tilmon complete evaluation includes B-mode imaging, spectral Doppler, color Doppler, and power Doppler as needed of all accessible portions of each vessel. Bilateral testing is considered an integral part of Verlee Pope complete examination. Limited examinations for reoccurring indications may be performed as noted.  Right Carotid Findings: +----------+--------+--------+--------+------------------+------------------+           PSV cm/sEDV cm/sStenosisPlaque  DescriptionComments           +----------+--------+--------+--------+------------------+------------------+ CCA Prox  54      8                                                    +----------+--------+--------+--------+------------------+------------------+ CCA Distal57      12                                intimal thickening +----------+--------+--------+--------+------------------+------------------+ ICA Prox  50      16      1-39%   calcific                             +----------+--------+--------+--------+------------------+------------------+ ICA Distal79      16                                                   +----------+--------+--------+--------+------------------+------------------+ ECA       74      5                                                    +----------+--------+--------+--------+------------------+------------------+ +----------+--------+-------+--------+-------------------+           PSV cm/sEDV cmsDescribeArm Pressure (mmHG) +----------+--------+-------+--------+-------------------+ [id number]     0                                  +----------+--------+-------+--------+-------------------+ +---------+--------+--+--------+--+  VertebralPSV cm/s53EDV cm/s16 +---------+--------+--+--------+--+  Left Carotid Findings: +----------+--------+--------+--------+------------------+------------------+           PSV cm/sEDV cm/sStenosisPlaque DescriptionComments           +----------+--------+--------+--------+------------------+------------------+ CCA Prox  58      9                                                    +----------+--------+--------+--------+------------------+------------------+ CCA Distal66      15                                intimal thickening +----------+--------+--------+--------+------------------+------------------+ ICA Prox  94      24      1-39%   calcific                              +----------+--------+--------+--------+------------------+------------------+ ICA Distal80      26                                                   +----------+--------+--------+--------+------------------+------------------+ ECA       61      0                                                    +----------+--------+--------+--------+------------------+------------------+ +----------+--------+--------+--------+-------------------+           PSV cm/sEDV cm/sDescribeArm Pressure (mmHG) +----------+--------+--------+--------+-------------------+ Subclavian109     0                                   +----------+--------+--------+--------+-------------------+ +---------+--------+--+--------+--+ VertebralPSV cm/s50EDV cm/s11 +---------+--------+--+--------+--+   Summary: Right Carotid: Velocities in the right ICA are consistent with Nidia Grogan 1-39% stenosis.                Non-hemodynamically significant plaque <50% noted in the CCA. Left Carotid: Velocities in the left ICA are consistent with Altair Appenzeller 1-39% stenosis.               Non-hemodynamically significant plaque <50% noted in the CCA. Vertebrals:  Bilateral vertebral arteries demonstrate antegrade flow. Subclavians: Normal flow hemodynamics were seen in bilateral subclavian              arteries. *See table(s) above for measurements and observations.  Electronically signed by Lemar Livings MD on 06/25/2023 at 6:57:30 PM.    Final    MR ANGIO HEAD WO CONTRAST  Result Date: 06/25/2023 CLINICAL DATA:  Follow-up stroke. Chronic kidney disease. Hypertension. Atrial fibrillation. EXAM: MRA HEAD WITHOUT CONTRAST TECHNIQUE: Angiographic images of the Circle of Willis were acquired using MRA technique without intravenous contrast. COMPARISON:  Brain MRI yesterday. FINDINGS: Anterior circulation: Both internal carotid arteries are patent through the skull base and siphon regions. There is atherosclerotic narrowing and irregularity of the siphon  regions, more on the right than the left. 50% stenosis in the right siphon. No measurable stenosis in the left siphon. On  the left, the ICA supplies the middle cerebral artery and both anterior cerebral arteries. On the right, the ICA supplies only the middle cerebral artery. No evidence of large vessel occlusion or proximal circle-of-Willis stenosis. No aneurysm or vascular malformation. Posterior circulation: Both vertebral arteries widely patent to the basilar artery. No basilar stenosis. Posterior circulation branch vessels are patent. There is moderate atherosclerotic narrowing and irregularity of the proximal posterior cerebral arteries, slightly more on the left than the right. Poor flow in the right superior cerebellar artery compared to the left. Anatomic variants: None other significant. Other: None. IMPRESSION: 1. No evidence of large vessel occlusion or proximal Circle of Willis stenosis. 2. Atherosclerotic narrowing and irregularity of the siphon regions, more on the right than the left. 50% stenosis in the right siphon. 3. Moderate atherosclerotic narrowing and irregularity of the proximal posterior cerebral arteries, slightly more on the left than the right. Poor flow in the right superior cerebellar artery compared to the left. Electronically Signed   By: Paulina Fusi M.D.   On: 06/25/2023 10:29   MR BRAIN WO CONTRAST  Result Date: 06/25/2023 CLINICAL DATA:  Initial evaluation for neuro deficit, stroke suspected. EXAM: MRI HEAD WITHOUT CONTRAST TECHNIQUE: Multiplanar, multiecho pulse sequences of the brain and surrounding structures were obtained without intravenous contrast. COMPARISON:  Prior CT from earlier the same day. FINDINGS: Brain: Examination moderately degraded by motion artifact. Cerebral volume within normal limits. Patchy T2/FLAIR hyperintensity involving the periventricular white matter, most characteristic of chronic microvascular ischemic disease, mild for age. Two distinct  punctate foci of diffusion signal abnormality are seen involving the subcortical right frontal lobe (series 2, image 40) as well as the subcortical left frontoparietal region (series 2, image 42). No associated hemorrhage or mass effect. Findings consistent with small acute to early subacute small vessel type ischemic infarcts. No associated hemorrhage or mass effect. No other evidence for acute or subacute ischemia. No acute intracranial hemorrhage. Single punctate chronic microhemorrhage noted at the left occipital lobe, of doubtful significance in isolation. No mass lesion, midline shift or mass effect. No hydrocephalus or extra-axial fluid collection. Pituitary gland suprasellar region within normal limits. Vascular: Major intracranial vascular flow voids are maintained. Skull and upper cervical spine: Craniocervical junction within normal limits. Bone marrow signal intensity normal. No scalp soft tissue abnormality. Sinuses/Orbits: Prior ocular lens replacement on the right. Paranasal sinuses are largely clear. No mastoid effusion. Other: None. IMPRESSION: 1. Two punctate acute to early subacute small vessel type ischemic infarcts involving the subcortical right frontal lobe and left frontoparietal region. No associated hemorrhage or mass effect. 2. Underlying chronic microvascular ischemic disease, mild for age. Electronically Signed   By: Rise Mu M.D.   On: 06/25/2023 02:34   CT HEAD CODE STROKE WO CONTRAST  Result Date: 06/24/2023 CLINICAL DATA:  Code stroke. Initial evaluation for altered mental status, right facial droop. EXAM: CT HEAD WITHOUT CONTRAST TECHNIQUE: Contiguous axial images were obtained from the base of the skull through the vertex without intravenous contrast. RADIATION DOSE REDUCTION: This exam was performed according to the departmental dose-optimization program which includes automated exposure control, adjustment of the mA and/or kV according to patient size and/or use  of iterative reconstruction technique. COMPARISON:  Prior study from 06/02/2023. FINDINGS: Brain: Age-related cerebral atrophy with moderately advanced chronic microvascular ischemic disease. Remote lacunar infarct at the posterior limb of the right internal capsule. No acute intracranial hemorrhage. No acute large vessel territory infarct. No mass lesion or midline shift. No hydrocephalus or  extra-axial fluid collection. Vascular: No abnormal hyperdense vessel. Calcified atherosclerosis present at the skull base. Skull: Scalp soft tissues and calvarium within normal limits. Sinuses/Orbits: Globes and orbital soft tissues within normal limits. Paranasal sinuses are largely clear. Few small osteomas noted about the left ethmoidal air cells. Mastoid air cells are clear. Other: None. ASPECTS [facility] Stroke Program Early CT Score) - Ganglionic level infarction (caudate, lentiform nuclei, internal capsule, insula, M1-M3 cortex): 7 - Supraganglionic infarction (M4-M6 cortex): 3 Total score (0-10 with 10 being normal): 10 IMPRESSION: 1. No acute intracranial abnormality. 2. ASPECTS is 10. 3. Age-related cerebral atrophy with moderate chronic microvascular ischemic disease. These results were communicated to Dr. Wilford Corner at 9:21 pm on 06/24/2023 by text page via the [facility] messaging system. Electronically Signed   By: Rise Mu M.D.   On: 06/24/2023 21:24   DG CHEST PORT 1 VIEW  Result Date: 06/04/2023 CLINICAL DATA:  Dyspnea. EXAM: PORTABLE CHEST 1 VIEW COMPARISON:  June 02, 2023. FINDINGS: Stable cardiomegaly. Stable large hiatal hernia. Both lungs are clear. The visualized skeletal structures are unremarkable. IMPRESSION: No active disease.  Stable large hiatal hernia. Electronically Signed   By: Lupita Raider M.D.   On: 06/04/2023 12:24   ECHOCARDIOGRAM COMPLETE  Result Date: 06/02/2023    ECHOCARDIOGRAM REPORT   Patient Name:   KASSIDEE HOWER Date of Exam: 06/02/2023 Medical Rec #:  [id number]       Height:       65.0 in Accession #:    [id number]     Weight:       166.7 lb Date of Birth:  10-Oct-1940      BSA:          1.831 m Patient Age:    81 years       BP:           158/74 mmHg Patient Gender: F              HR:           82 bpm. Exam Location:  Inpatient Procedure: 2D Echo, Cardiac Doppler, Color Doppler and Strain Analysis Indications:    Atrial Fibrillation I48.91  History:        Patient has no prior history of Echocardiogram examinations.                 Stroke; Risk Factors:Diabetes and Hypertension.  Sonographer:    Darlys Gales Referring Phys: [id number] DAVID MANUEL ORTIZ  Sonographer Comments: Global longitudinal strain was attempted. IMPRESSIONS  1. Difficult strain acquisition; consider future PYP testing or CMR testing. Left ventricular ejection fraction, by estimation, is 65 to 70%. The left ventricle has normal function. The left ventricle has no regional wall motion abnormalities. 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 Left ventricular diastolic parameters are indeterminate.  2. Right ventricular systolic function is normal. The right ventricular size is normal. Moderately increased right ventricular wall thickness. There is mildly elevated pulmonary artery systolic pressure. The estimated right ventricular systolic pressure  is 38.0 mmHg.  3. Gal Feldhaus small pericardial effusion is present. The pericardial effusion is circumferential.  4. The mitral valve is degenerative. Trivial mitral valve regurgitation. The mean mitral valve gradient is 3.7 mmHg with average heart rate of 83 bpm. Moderate mitral annular calcification.  5. Tricuspid valve regurgitation is mild to moderate.  6. The aortic valve was not well visualized. There is moderate calcification of the aortic valve. Aortic valve regurgitation is not visualized. Aortic  valve sclerosis is present, with no evidence of aortic valve stenosis.  7. The inferior vena cava is normal in size with greater  than 50% respiratory variability, suggesting right atrial pressure of 3 mmHg. FINDINGS  Left Ventricle: Difficult strain acquisition; consider future PYP testing or CMR testing. Left ventricular ejection fraction, by estimation, is 65 to 70%. The left ventricle has normal function. The left ventricle has no regional wall motion abnormalities. Global longitudinal strain performed but not reported based on interpreter judgement due to suboptimal tracking. The left ventricular internal cavity size was normal in size. 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 Left ventricular diastolic parameters are indeterminate. Right Ventricle: The right ventricular size is normal. Moderately increased right ventricular wall thickness. Right ventricular systolic function is normal. There is mildly elevated pulmonary artery systolic pressure. The tricuspid regurgitant velocity is 2.96 m/s, and with an assumed right atrial pressure of 3 mmHg, the estimated right ventricular systolic pressure is 38.0 mmHg. Left Atrium: Left atrial size was normal in size. Right Atrium: Right atrial size was normal in size. Pericardium: Tonie Elsey small pericardial effusion is present. The pericardial effusion is circumferential. Mitral Valve: The mitral valve is degenerative in appearance. Moderate mitral annular calcification. Trivial mitral valve regurgitation. MV peak gradient, 6.7 mmHg. The mean mitral valve gradient is 3.7 mmHg with average heart rate of 83 bpm. Tricuspid Valve: The tricuspid valve is grossly normal. Tricuspid valve regurgitation is mild to moderate. No evidence of tricuspid stenosis. Aortic Valve: The aortic valve was not well visualized. There is moderate calcification of the aortic valve. Aortic valve regurgitation is not visualized. Aortic valve sclerosis is present, with no evidence of aortic valve stenosis. Aortic valve mean gradient measures 4.0 mmHg. Aortic valve peak gradient measures 6.2  mmHg. Aortic valve area, by VTI measures 1.67 cm. Pulmonic Valve: The pulmonic valve was not well visualized. Pulmonic valve regurgitation is not visualized. No evidence of pulmonic stenosis. Aorta: The aortic root is normal in size and structure. Venous: The inferior vena cava is normal in size with greater than 50% respiratory variability, suggesting right atrial pressure of 3 mmHg. IAS/Shunts: No atrial level shunt detected by color flow Doppler.  LEFT VENTRICLE PLAX 2D LVIDd:         4.20 cm   Diastology LVIDs:         2.30 cm   LV e' medial:    4.24 cm/s LV PW:         1.40 cm   LV E/e' medial:  22.9 LV IVS:        1.40 cm   LV e' lateral:   6.64 cm/s LVOT diam:     1.70 cm   LV E/e' lateral: 14.7 LV SV:         43 LV SV Index:   24 LVOT Area:     2.27 cm  RIGHT VENTRICLE RV S prime:     9.68 cm/s TAPSE (M-mode): 2.2 cm LEFT ATRIUM             Index        RIGHT ATRIUM           Index LA Vol (A2C):   35.9 ml 19.61 ml/m  RA Area:     15.00 cm LA Vol (A4C):   34.5 ml 18.85 ml/m  RA Volume:   37.60 ml  20.54 ml/m LA Biplane Vol: 36.5 ml 19.94 ml/m  AORTIC VALVE AV Area (Vmax):    1.92  cm AV Area (Vmean):   1.69 cm AV Area (VTI):     1.67 cm AV Vmax:           125.00 cm/s AV Vmean:          94.900 cm/s AV VTI:            0.260 m AV Peak Grad:      6.2 mmHg AV Mean Grad:      4.0 mmHg LVOT Vmax:         106.00 cm/s LVOT Vmean:        70.500 cm/s LVOT VTI:          0.191 m LVOT/AV VTI ratio: 0.73  AORTA Ao Root diam: 2.60 cm MITRAL VALVE                TRICUSPID VALVE MV Area (PHT): 3.60 cm     TR Peak grad:   35.0 mmHg MV Area VTI:   1.48 cm     TR Vmax:        296.00 cm/s MV Peak grad:  6.7 mmHg MV Mean grad:  3.7 mmHg     SHUNTS MV Vmax:       1.29 m/s     Systemic VTI:  0.19 m MV Vmean:      94.1 cm/s    Systemic Diam: 1.70 cm MV Decel Time: 211 msec MV E velocity: 97.30 cm/s MV Carrie Schoonmaker velocity: 104.00 cm/s MV E/Domonique Cothran ratio:  0.94 Riley Lam MD Electronically signed by Riley Lam MD  Signature Date/Time: 06/02/2023/3:27:36 PM    Final    DG Pelvis Portable  Result Date: 06/02/2023 CLINICAL DATA:  Pain after fall. EXAM: PORTABLE PELVIS 1 VIEWS COMPARISON:  None Available. FINDINGS: Limited rightward rotated pelvis. No evidence of fracture involving the hips or pelvis. Generalized osteopenia and extensive atheromatous calcification. IMPRESSION: Negative rotated pelvis. Electronically Signed   By: Tiburcio Pea M.D.   On: 06/02/2023 07:38   DG Femur Portable Min 2 Views Left  Result Date: 06/02/2023 CLINICAL DATA:  Pain after fall. EXAM: LEFT FEMUR PORTABLE 2 VIEWS COMPARISON:  07/19/2017 FINDINGS: There is no evidence of fracture or other focal bone lesions. Caycee Wanat small generalized osteopenia and atheromatous calcification. IMPRESSION: No acute finding. Electronically Signed   By: Tiburcio Pea M.D.   On: 06/02/2023 07:34   DG Knee Complete 4 Views Left  Result Date: 06/02/2023 CLINICAL DATA:  Fall with left knee injury. EXAM: LEFT KNEE - COMPLETE 4+ VIEW COMPARISON:  Study of 01/24/2007 FINDINGS: Interval progressive bone density loss is noted as well as marked interval progression of vascular calcifications. No fracture is evident. No joint effusion is seen. There is moderate tricompartmental joint space loss with small tricompartmental marginal osteophytes and meniscal chondrocalcinosis. Superficial soft tissues show no acute abnormality. No visible foreign body. IMPRESSION: 1. Interval progressive bone density loss and marked progression of diffuse vascular calcifications. 2. No fracture or joint effusion. 3. Tricompartmental degenerative changes with meniscal chondrocalcinosis. Electronically Signed   By: Almira Bar M.D.   On: 06/02/2023 02:42   DG Chest 2 View  Result Date: 06/02/2023 CLINICAL DATA:  Short fall down steps at home with landing on the patient's back. EXAM: CHEST - 2 VIEW COMPARISON:  Portable chest 03/22/2022 FINDINGS: Large hiatal hernia is again noted  eccentric to the right. Mild cardiomegaly. The mediastinum is stable, with aortic uncoiling and calcific plaques. No vascular congestion is seen. The lungs are clear. There is no substantial pleural effusion.  Osteopenia with mild thoracic kyphodextroscoliosis. IMPRESSION: No evidence of acute chest injury or pneumothorax. Large hiatal hernia. Cardiomegaly and aortic atherosclerosis. No radiographic interval change. Electronically Signed   By: Almira Bar M.D.   On: 06/02/2023 02:38   CT L-SPINE NO CHARGE  Result Date: 06/02/2023 CLINICAL DATA:  Low back injury. Short fall down steps at home. Landed on her back. EXAM: CT LUMBAR SPINE WITHOUT CONTRAST TECHNIQUE: Multidetector CT imaging of the lumbar spine was performed without intravenous contrast administration. Multiplanar CT image reconstructions were also generated. RADIATION DOSE REDUCTION: This exam was performed according to the departmental dose-optimization program which includes automated exposure control, adjustment of the mA and/or kV according to patient size and/or use of iterative reconstruction technique. COMPARISON:  CT abdomen and pelvis and reconstructions 03/22/2022. FINDINGS: Segmentation: 5 lumbar type vertebrae. Alignment: No new abnormality. Again noted is Tallie Hevia slight dextroscoliosis, Merrissa Giacobbe trace chronic degenerative retrolisthesis at L2-3, and Iraida Cragin 1 cm chronic grade 1 anterolisthesis related to chronic disc collapse and DJD at L5-S1. Vertebrae: Osteopenia. There is Asna Muldrow minimal new concave compression deformity of the L1 upper plate and Rogena Deupree slight posterior bowing of the dorsal vertebral cortex. This could be acute and is certainly new from 03/22/2022, but could be subacute as there is linear sclerosis in portions of the vertebral body which may be seen with compaction of trabecular elements with an acute injury, or with Yury Schaus healing response due to Stanely Sexson subacute injury. No other new injury is seen. There are mild chronic upper plate anterior wedge  compression fractures of the T12 and L4 vertebral bodies and Myrta Mercer chronic mild lower plate anterior wedge compression fracture of L2. There is lumbar spondylosis. Chronic moderate reactive endplate sclerosis opposite L5-S1. Prominent superior endplate Schmorl's node formation T12 also chronic. All pedicles and posterior elements are intact, with spinous process abutment L3-5 with opposing surface spurring. No primary pathologic process is suspected. In addition there is no sacral insufficiency fracture. Paraspinal and other soft tissues: No paraspinal mass or hematoma. Slight paravertebral edema at L1. Disc levels: The lumbar disc spaces are normal in height except for Valary Manahan chronic advanced collapse of L5-S1, with vacuum phenomenon. At T12-L1 and L1-2 there is no significant soft tissue or bony encroachment on the thecal sac and foramina. At L2-3, there is mild spinal canal stenosis due to posterior disc osteophyte complex and facet joint spurring change, bilateral mild to moderate acquired foraminal stenosis. At L3-4, there is mild-to-moderate spinal canal stenosis due to dorsal ligamentous thickening, posterior disc osteophyte complex and moderate facet osteophytes. There are bilateral mild-to-moderate foraminal stenosis. At L4-5, there is Darrin Apodaca mild nonstenosing posterior disc bulge, with effacement in the lateral recesses due to moderate facet hypertrophy. Due to foraminal disc bulging there is moderate left and mild right foraminal stenosis. At L5-S1, chronic disc collapse, vacuum phenomenon is noted, and Aniyha Tate small right paracentral calcified disc extrusion. Due to the anterolisthesis there is moderate spinal canal stenosis and severe bilateral foraminal stenosis. Advanced facet hypertrophy. IMPRESSION: 1. Minimal new concave compression deformity of the L1 upper plate with slight posterior bowing of the dorsal vertebral cortex. This could be acute or subacute. There is only mild paravertebral edema and no paravertebral  hematoma. 2. No other recent fractures are seen. Chronic compression fractures described above. 3. Osteopenia, degenerative and chronic changes. 4. Spinous process abutment L3-5 with opposing surface spurring. 5. No new abnormal alignment. 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 6. Remaining detailed findings discussed above. Electronically  Signed   By: Almira Bar M.D.   On: 06/02/2023 02:35   CT ABDOMEN PELVIS WO CONTRAST  Result Date: 06/02/2023 CLINICAL DATA:  Short fall down stairs at home landing on her back, with polytrauma to the abdomen and back. 82 year old female. EXAM: CT ABDOMEN AND PELVIS WITHOUT CONTRAST TECHNIQUE: Multidetector CT imaging of the abdomen and pelvis was performed following the standard protocol without IV contrast. RADIATION DOSE REDUCTION: This exam was performed according to the departmental dose-optimization program which includes automated exposure control, adjustment of the mA and/or kV according to patient size and/or use of iterative reconstruction technique. COMPARISON:  CT abdomen and pelvis no contrast 03/14/2022 FINDINGS: Lower chest: Large hiatal hernia with inverted intrathoracic stomach partially visible. Small chronic pericardial effusion. Cardiac size is normal. There are coronary artery calcifications. Mild subpleural reticulation both lung bases and calcified right middle lobe granuloma without acute process. Hepatobiliary: There is either sludge or 1 or more noncalcified stones in the distal gallbladder lumen. No wall thickening or bile duct dilatation. No focal liver abnormality is seen without contrast. Pancreas: Unremarkable without contrast. Spleen: Unremarkable without contrast. Adrenals/Urinary Tract: There is no adrenal mass. Right renal volume loss and patchy cortical thinning again noted. Bilateral Bosniak 1 cysts, largest on the left is posteriorly measuring 2.5 cm. Largest on the right is inferior measuring 4  cm. No other contour deforming abnormalities seen with limited evaluation without contrast. There is Chessie Neuharth punctate nonobstructive caliceal stone in the midpole of the right kidney, another is seen in the midpole left. There are no obstructing stones or hydronephrosis. There is no bladder thickening. Stomach/Bowel: No dilatation or wall thickening including the appendix. Sigmoid diverticulosis noted without evidence of diverticulitis. Vascular/Lymphatic: Moderate aortoiliac and visceral abdominal arterial calcific plaque. Scattered vascular calcification at the renal hila. Multiple left gonadal vein phleboliths.  Pelvic phleboliths. No aneurysm is seen.  No adenopathy. Reproductive: Status post hysterectomy. No adnexal masses. Other: No abdominal wall hernia or abnormality. No abdominopelvic ascites. There is no free air or free hemorrhage. Musculoskeletal: Osteopenia. Moderate chronic wedge compression fracture T10. Mild chronic lower plate wedge compression fracture L2 and slight upper plate chronic compression fractures are again noted of T12 and L4. At L1 there is Jaelan Rasheed slight increased concave appearance in the superior endplate consistent with an interval new mild compression fracture, without significant anterior or posterior height loss but with slight posterior bowing of the dorsal vertebral cortex. This could be acute and is new from August 2023, but could be subacute given the appearance of scattered linear sclerosis in the vertebral body, which would either represent compaction of trabecular elements or Jesicca Dipierro healing response. There is no paraspinal hematoma. The pedicles and posterior elements are intact. No other visible regional skeletal fracture. There is advanced degenerative change L5-S1 with reactive endplate sclerosis and chronic grade 1 degenerative spondylolisthesis. IMPRESSION: 1. Mild L1 superior endplate central compression fracture with slight posterior bowing of the dorsal vertebral cortex but no  significant anterior or posterior height loss. This could be acute and is new from August 2023, but could be subacute given the appearance of scattered linear sclerosis in the vertebral body. 2. No other acute trauma related findings in the abdomen or pelvis. 3. Large hiatal hernia with inverted intrathoracic stomach. 4. Aortic and coronary artery atherosclerosis. 5. Sludge or noncalcified stones in the distal gallbladder. 6. Nonobstructive micronephrolithiasis. 7. Diverticulosis without evidence of diverticulitis. Aortic Atherosclerosis (ICD10-I70.0). Electronically Signed   By: Almira Bar M.D.   On: 06/02/2023  02:17   CT Cervical Spine Wo Contrast  Result Date: 06/02/2023 CLINICAL DATA:  Neck trauma. Patient fell down 2 steps, landing on the back. Dizziness and headache. EXAM: CT CERVICAL SPINE WITHOUT CONTRAST TECHNIQUE: Multidetector CT imaging of the cervical spine was performed without intravenous contrast. Multiplanar CT image reconstructions were also generated. RADIATION DOSE REDUCTION: This exam was performed according to the departmental dose-optimization program which includes automated exposure control, adjustment of the mA and/or kV according to patient size and/or use of iterative reconstruction technique. COMPARISON:  None Available. FINDINGS: Alignment: Normal alignment. Skull base and vertebrae: No acute fracture. No primary bone lesion or focal pathologic process. Soft tissues and spinal canal: No prevertebral fluid or swelling. No visible canal hematoma. Disc levels: Degenerative changes with disc space narrowing and endplate osteophyte formation throughout. Multilevel degenerative disc changes with vacuum phenomenon. Degenerative changes in the posterior facet joint. Posterior disc osteophyte complexes cause encroachment upon the anterior central canal at multiple levels. Upper chest: Lung apices are clear. Other: None. IMPRESSION: 1. Normal alignment.  No acute displaced fractures  identified. 2. Prominent degenerative changes throughout the cervical spine. Electronically Signed   By: Burman Nieves M.D.   On: 06/02/2023 01:54   CT Head Wo Contrast  Result Date: 06/02/2023 CLINICAL DATA:  Head trauma. No focal neurological findings. Patient fell backwards down 2 steps, striking the back of the head. Dizziness and slight headache. EXAM: CT HEAD WITHOUT CONTRAST TECHNIQUE: Contiguous axial images were obtained from the base of the skull through the vertex without intravenous contrast. RADIATION DOSE REDUCTION: This exam was performed according to the departmental dose-optimization program which includes automated exposure control, adjustment of the mA and/or kV according to patient size and/or use of iterative reconstruction technique. COMPARISON:  03/22/2021 FINDINGS: Brain: Diffuse cerebral atrophy. Ventricular dilatation consistent with central atrophy. Low-attenuation changes in the deep white matter consistent with small vessel ischemia. No abnormal extra-axial fluid collections. No mass effect or midline shift. Gray-white matter junctions are distinct. Basal cisterns are not effaced. No acute intracranial hemorrhage. Vascular: No hyperdense vessel or unexpected calcification. Skull: Normal. Negative for fracture or focal lesion. Sinuses/Orbits: Mucosal thickening in the paranasal sinuses. No acute air-fluid levels. Mastoid air cells are clear. Other: None. IMPRESSION: No acute intracranial abnormalities. Chronic atrophy and small vessel ischemic changes. Electronically Signed   By: Burman Nieves M.D.   On: 06/02/2023 01:51    Microbiology: No results found for this or any previous visit (from the past 240 hour(s)).   Labs: Basic Metabolic Panel: Recent Labs  Lab 06/24/23 2109 06/24/23 2112 06/25/23 0335 06/26/23 0737 06/26/23 0738  NA 136 140 139  --  138  K 3.5 3.6 3.6  --  3.7  CL 103 103 105  --  105  CO2 24  --  23  --  23  GLUCOSE 136* 130* 157*  --  118*   BUN 14 16 15   --  22  CREATININE 1.68* 1.70* 1.46*  --  1.75*  CALCIUM 8.6*  --  8.3*  --  8.5*  MG  --   --   --  1.2*  --    Liver Function Tests: Recent Labs  Lab 06/24/23 2109 06/25/23 0335  AST 21 19  ALT 14 12  ALKPHOS 77 70  BILITOT 0.6 0.7  PROT 7.3 6.6  ALBUMIN 2.4* 2.2*   No results for input(s): "LIPASE", "AMYLASE" in the last 168 hours. No results for input(s): "AMMONIA" in the last 168 hours.  CBC: Recent Labs  Lab 06/24/23 2109 06/24/23 2112 06/25/23 0335  WBC 8.9  --  8.5  NEUTROABS 5.4  --   --   HGB 10.5* 11.9* 10.0*  HCT 33.7* 35.0* 31.5*  MCV 104.0*  --  102.3*  PLT 320  --  321   Cardiac Enzymes: No results for input(s): "CKTOTAL", "CKMB", "CKMBINDEX", "TROPONINI" in the last 168 hours. BNP: BNP (last 3 results) No results for input(s): "BNP" in the last 8760 hours.  ProBNP (last 3 results) No results for input(s): "PROBNP" in the last 8760 hours.  CBG: Recent Labs  Lab 06/25/23 0759 06/25/23 1135 06/25/23 1800 06/25/23 2106 06/26/23 1108  GLUCAP 122* 144* 105* 154* 160*       Signed:  Lacretia Nicks MD.  [facility] 06/26/2023, 1:15 PM

## 2023-06-26 NOTE — Care Management CC44 (Signed)
Condition Code 44 Documentation Completed  Patient Details  Name: Donna Green MRN: [id number] Date of Birth: 10/13/1940   Condition Code 44 given:  Yes Patient signature on Condition Code 44 notice:  Yes Documentation of 2 MD's agreement:  Yes Code 44 added to claim:  Yes    Kermit Balo, RN 06/26/2023, 11:06 AM

## 2023-06-26 NOTE — Plan of Care (Addendum)
Patient discharging home with home health services. AVS printed and reviewed with patient and daughter called by Enid Derry, RN. Patient transported to the front entrance via wheelchair, and picked up by daughter.

## 2023-06-26 NOTE — Evaluation (Signed)
Speech Language Pathology Evaluation Patient Details Name: JANEYAH SASTRY MRN: [id number] DOB: 07/22/1941 Today's Date: 06/26/2023 Time: 7846-9629 SLP Time Calculation (min) (ACUTE ONLY): 10 min  Problem List:  Patient Active Problem List   Diagnosis Date Noted   Acute ischemic stroke (HCC) 06/25/2023   Stroke (cerebrum) (HCC) 06/25/2023   Hypertensive emergency 06/24/2023   Facial droop-resolved 06/24/2023   Confusion 06/24/2023   CKD stage 3b, GFR 30-44 ml/min (HCC) 06/24/2023   GAD (generalized anxiety disorder) 06/24/2023   Acute metabolic encephalopathy 06/24/2023   Fall 06/07/2023   Palliative care by specialist 06/07/2023   Goals of care, counseling/discussion 06/07/2023   Paroxysmal atrial fibrillation (HCC) 06/03/2023   Hypertensive crisis 06/02/2023   Hypokalemia 06/02/2023   Hypomagnesemia 06/02/2023   Large hiatal hernia 06/02/2023   Atrial fibrillation with RVR (HCC) 06/02/2023   Closed compression fracture of L1 vertebra (HCC) 06/02/2023   COVID-19 virus infection 03/23/2022   Anxiety 03/23/2022   Acute kidney injury superimposed on chronic kidney disease (HCC) 03/22/2022   Essential hypertension 03/09/2015   Hyperlipidemia 03/09/2015   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  Found to have BP  255/105 and MRI showing acute to early subacute small vessel ischemic infarcts R frontal and L frontoparietal.  Prior to admit, pt was living at home with daughter who is available 24/7 to provide assistance.  PMH: recent fall with L1 comp fx, DM, HLD, HTN, CKD, HOH, PAF on Eliquis, large HH, GAD and CVA x2 with residual R hemiparesis.   Assessment / Plan / Recommendation Clinical Impression  Pt appears to have left inattention, needs verbal and visual cues to find speakers on the left. Could not locate call bell on left though she could verbalize procedure to call for help and need for help to get up. Otherwise processing is slow, pt is hard of hearing. She has appropriate level of care at baseline from daughter, but there is a chance of increased communicaiton breakdown and poor safety awareness. Pt would benefit from home health SLP to address cognition and communication with caregiver. SLP will sign off acutely.    SLP Assessment  SLP Recommendation/Assessment: All further Speech Lanaguage Pathology  needs can be addressed in the next venue of care SLP Visit Diagnosis: Cognitive communication deficit (R41.841)    Recommendations for follow up therapy are one component of a multi-disciplinary discharge planning process, led by the attending physician.  Recommendations may be updated based on patient status, additional functional criteria and insurance authorization.    Follow Up Recommendations  Home health SLP    Assistance Recommended at Discharge     Functional Status Assessment    Frequency and Duration  SLP Evaluation Cognition  Overall Cognitive Status: History of cognitive impairments - at baseline Orientation Level: Oriented X4       Comprehension  Auditory Comprehension Overall Auditory Comprehension: Other (comment) (Hard of hearing) Interfering Components: Hearing    Expression Expression Primary Mode of Expression: Verbal Verbal Expression Overall Verbal  Expression: Appears within functional limits for tasks assessed   Oral / Motor  Motor Speech Overall Motor Speech: Appears within functional limits for tasks assessed            [place], Riley Nearing 06/26/2023, 10:05 AM

## 2023-06-26 NOTE — Progress Notes (Signed)
Physical Therapy Treatment Patient Details Name: Donna Green MRN: [id number] DOB: 1941-05-15 Today's Date: 06/26/2023   History of Present Illness Pt is 82 yo female who presents on 06/24/23 with AMS, trouble speaking and facial droop.  Found to have BP 255/105 and MRI showing acute to early subacute small vessel ischemic infarcts R frontal and L frontoparietal.  PMH: recent fall with L1 comp fx, DM, HLD, HTN, CKD, HOH, PAF on Eliquis, large HH, GAD and CVA x2 with residual R hemiparesis.    PT Comments  Pt greeted resting in bed and agreeable to session with continued progress towards acute goals. Pt requiring increased cues to maintain spinal precautions throughout session, needing max cues to recall all precautions. Pt requiring min A to complete bed mobility and transfers to stand, and demonstrating gait with CGA with RW for support. Pt requiring cues and assist during gait for upright posture and closer RW proximity, pt with noted slight R drift in hall, able to avoid obstacles. Current plan remains appropriate to address deficits and maximize functional independence and safety.  Pt continues to benefit from skilled PT services to progress toward functional mobility goals.     If plan is discharge home, recommend the following: A little help with walking and/or transfers;A little help with bathing/dressing/bathroom;Help with stairs or ramp for entrance;Assist for transportation;Assistance with cooking/housework   Can travel by private vehicle     Yes  Equipment Recommendations  None recommended by PT    Recommendations for Other Services       Precautions / Restrictions Precautions Precautions: Fall;Back Precaution Comments: HOH, pt able to recall 0/3 spinal precautions Required Braces or Orthoses: Spinal Brace Spinal Brace: Thoracolumbosacral orthotic Restrictions Weight Bearing Restrictions: No     Mobility  Bed Mobility Overal bed mobility: Needs Assistance Bed Mobility:  Rolling, Sidelying to Sit, Sit to Sidelying Rolling: Min assist, Used rails Sidelying to sit: Min assist, HOB elevated, Used rails     Sit to sidelying: Min assist General bed mobility comments: VC for technique, min A to elevate trunk and return LEs to bed at end of session    Transfers Overall transfer level: Needs assistance Equipment used: Rolling walker (2 wheels) Transfers: Sit to/from Stand, Bed to chair/wheelchair/BSC Sit to Stand: Min assist           General transfer comment: VC for hand placement with RW management prior to sit<>stand transition. min A to boost to stand    Ambulation/Gait Ambulation/Gait assistance: Contact guard assist Gait Distance (Feet): 100 Feet Assistive device: Rolling walker (2 wheels) Gait Pattern/deviations: Step-through pattern, Decreased stride length, Trunk flexed Gait velocity: decr     General Gait Details: cues for closer proximity to RW and to center self more in RW frame, cues for upright posture despite brace being donned pt with downward gaze throughout, slight drift to R   Stairs             Wheelchair Mobility     Tilt Bed    Modified Rankin (Stroke Patients Only) Modified Rankin (Stroke Patients Only) Pre-Morbid Rankin Score: Moderately severe disability Modified Rankin: Moderately severe disability     Balance Overall balance assessment: Mild deficits observed, not formally tested                                          Cognition Arousal: Alert Behavior During Therapy: Flat affect  Overall Cognitive Status: History of cognitive impairments - at baseline                     Current Attention Level: Sustained   Following Commands: Follows one step commands consistently, Follows one step commands with increased time     Problem Solving: Slow processing, Decreased initiation, Requires verbal cues, Requires tactile cues General Comments: Hearing impairment may have elevated  appearance of cognitive issues.        Exercises      General Comments General comments (skin integrity, edema, etc.): VSS on RA      Pertinent Vitals/Pain Pain Assessment Pain Assessment: Faces Faces Pain Scale: Hurts little more Pain Location: back Pain Descriptors / Indicators: Sore Pain Intervention(s): Monitored during session, Limited activity within patient's tolerance, Repositioned    Home Living                          Prior Function            PT Goals (current goals can now be found in the care plan section) Acute Rehab PT Goals PT Goal Formulation: With patient/family Time For Goal Achievement: 07/09/23 Progress towards PT goals: Progressing toward goals    Frequency    Min 1X/week      PT Plan      Co-evaluation              AM-PAC PT "6 Clicks" Mobility   Outcome Measure  Help needed turning from your back to your side while in a flat bed without using bedrails?: A Little Help needed moving from lying on your back to sitting on the side of a flat bed without using bedrails?: A Lot Help needed moving to and from a bed to a chair (including a wheelchair)?: A Little Help needed standing up from a chair using your arms (e.g., wheelchair or bedside chair)?: A Little Help needed to walk in hospital room?: A Little Help needed climbing 3-5 steps with a railing? : Total 6 Click Score: 15    End of Session Equipment Utilized During Treatment: Gait belt Activity Tolerance: Patient tolerated treatment well Patient left: in bed;with call bell/phone within reach Nurse Communication: Mobility status PT Visit Diagnosis: Other abnormalities of gait and mobility (R26.89);History of falling (Z91.81);Other symptoms and signs involving the nervous system (R29.898)     Time: 4696-2952 PT Time Calculation (min) (ACUTE ONLY): 19 min  Charges:    $Gait Training: 8-22 mins PT General Charges $$ ACUTE PT VISIT: 1 Visit                      Malka Bocek R. PTA Acute Rehabilitation Services Office: [phone]   Catalina Antigua 06/26/2023, 9:50 AM

## 2023-06-27 ENCOUNTER — Other Ambulatory Visit: Payer: Self-pay

## 2023-07-04 DIAGNOSIS — S32010D Wedge compression fracture of first lumbar vertebra, subsequent encounter for fracture with routine healing: Secondary | ICD-10-CM | POA: Diagnosis not present

## 2023-07-04 DIAGNOSIS — N184 Chronic kidney disease, stage 4 (severe): Secondary | ICD-10-CM | POA: Diagnosis not present

## 2023-07-04 DIAGNOSIS — E1165 Type 2 diabetes mellitus with hyperglycemia: Secondary | ICD-10-CM | POA: Diagnosis not present

## 2023-07-04 DIAGNOSIS — I129 Hypertensive chronic kidney disease with stage 1 through stage 4 chronic kidney disease, or unspecified chronic kidney disease: Secondary | ICD-10-CM | POA: Diagnosis not present

## 2023-07-04 DIAGNOSIS — B961 Klebsiella pneumoniae [K. pneumoniae] as the cause of diseases classified elsewhere: Secondary | ICD-10-CM | POA: Diagnosis not present

## 2023-07-04 DIAGNOSIS — E1122 Type 2 diabetes mellitus with diabetic chronic kidney disease: Secondary | ICD-10-CM | POA: Diagnosis not present

## 2023-07-04 DIAGNOSIS — E1159 Type 2 diabetes mellitus with other circulatory complications: Secondary | ICD-10-CM | POA: Diagnosis not present

## 2023-07-04 DIAGNOSIS — I69351 Hemiplegia and hemiparesis following cerebral infarction affecting right dominant side: Secondary | ICD-10-CM | POA: Diagnosis not present

## 2023-07-04 DIAGNOSIS — N390 Urinary tract infection, site not specified: Secondary | ICD-10-CM | POA: Diagnosis not present

## 2023-07-08 DIAGNOSIS — I129 Hypertensive chronic kidney disease with stage 1 through stage 4 chronic kidney disease, or unspecified chronic kidney disease: Secondary | ICD-10-CM | POA: Diagnosis not present

## 2023-07-08 DIAGNOSIS — E1122 Type 2 diabetes mellitus with diabetic chronic kidney disease: Secondary | ICD-10-CM | POA: Diagnosis not present

## 2023-07-08 DIAGNOSIS — S32010D Wedge compression fracture of first lumbar vertebra, subsequent encounter for fracture with routine healing: Secondary | ICD-10-CM | POA: Diagnosis not present

## 2023-07-08 DIAGNOSIS — E1159 Type 2 diabetes mellitus with other circulatory complications: Secondary | ICD-10-CM | POA: Diagnosis not present

## 2023-07-08 DIAGNOSIS — N390 Urinary tract infection, site not specified: Secondary | ICD-10-CM | POA: Diagnosis not present

## 2023-07-08 DIAGNOSIS — E1165 Type 2 diabetes mellitus with hyperglycemia: Secondary | ICD-10-CM | POA: Diagnosis not present

## 2023-07-08 DIAGNOSIS — B961 Klebsiella pneumoniae [K. pneumoniae] as the cause of diseases classified elsewhere: Secondary | ICD-10-CM | POA: Diagnosis not present

## 2023-07-08 DIAGNOSIS — I69351 Hemiplegia and hemiparesis following cerebral infarction affecting right dominant side: Secondary | ICD-10-CM | POA: Diagnosis not present

## 2023-07-08 DIAGNOSIS — N184 Chronic kidney disease, stage 4 (severe): Secondary | ICD-10-CM | POA: Diagnosis not present

## 2023-07-16 DIAGNOSIS — I4891 Unspecified atrial fibrillation: Secondary | ICD-10-CM | POA: Diagnosis not present

## 2023-07-16 DIAGNOSIS — E1122 Type 2 diabetes mellitus with diabetic chronic kidney disease: Secondary | ICD-10-CM | POA: Diagnosis not present

## 2023-07-16 DIAGNOSIS — E1165 Type 2 diabetes mellitus with hyperglycemia: Secondary | ICD-10-CM | POA: Diagnosis not present

## 2023-07-16 DIAGNOSIS — I700 Atherosclerosis of aorta: Secondary | ICD-10-CM | POA: Diagnosis not present

## 2023-07-16 DIAGNOSIS — N1832 Chronic kidney disease, stage 3b: Secondary | ICD-10-CM | POA: Diagnosis not present

## 2023-07-16 DIAGNOSIS — D6869 Other thrombophilia: Secondary | ICD-10-CM | POA: Diagnosis not present

## 2023-07-16 DIAGNOSIS — N2581 Secondary hyperparathyroidism of renal origin: Secondary | ICD-10-CM | POA: Diagnosis not present

## 2023-07-16 DIAGNOSIS — I480 Paroxysmal atrial fibrillation: Secondary | ICD-10-CM | POA: Diagnosis not present

## 2023-07-16 DIAGNOSIS — Z9181 History of falling: Secondary | ICD-10-CM | POA: Diagnosis not present

## 2023-07-16 DIAGNOSIS — I639 Cerebral infarction, unspecified: Secondary | ICD-10-CM | POA: Diagnosis not present

## 2023-07-18 DIAGNOSIS — I129 Hypertensive chronic kidney disease with stage 1 through stage 4 chronic kidney disease, or unspecified chronic kidney disease: Secondary | ICD-10-CM | POA: Diagnosis not present

## 2023-07-18 DIAGNOSIS — N184 Chronic kidney disease, stage 4 (severe): Secondary | ICD-10-CM | POA: Diagnosis not present

## 2023-07-18 DIAGNOSIS — N390 Urinary tract infection, site not specified: Secondary | ICD-10-CM | POA: Diagnosis not present

## 2023-07-18 DIAGNOSIS — E1122 Type 2 diabetes mellitus with diabetic chronic kidney disease: Secondary | ICD-10-CM | POA: Diagnosis not present

## 2023-07-18 DIAGNOSIS — I69351 Hemiplegia and hemiparesis following cerebral infarction affecting right dominant side: Secondary | ICD-10-CM | POA: Diagnosis not present

## 2023-07-18 DIAGNOSIS — E1165 Type 2 diabetes mellitus with hyperglycemia: Secondary | ICD-10-CM | POA: Diagnosis not present

## 2023-07-18 DIAGNOSIS — S32010D Wedge compression fracture of first lumbar vertebra, subsequent encounter for fracture with routine healing: Secondary | ICD-10-CM | POA: Diagnosis not present

## 2023-07-18 DIAGNOSIS — E1159 Type 2 diabetes mellitus with other circulatory complications: Secondary | ICD-10-CM | POA: Diagnosis not present

## 2023-07-18 DIAGNOSIS — B961 Klebsiella pneumoniae [K. pneumoniae] as the cause of diseases classified elsewhere: Secondary | ICD-10-CM | POA: Diagnosis not present

## 2023-07-30 DIAGNOSIS — E1122 Type 2 diabetes mellitus with diabetic chronic kidney disease: Secondary | ICD-10-CM | POA: Diagnosis not present

## 2023-07-30 DIAGNOSIS — I129 Hypertensive chronic kidney disease with stage 1 through stage 4 chronic kidney disease, or unspecified chronic kidney disease: Secondary | ICD-10-CM | POA: Diagnosis not present

## 2023-07-30 DIAGNOSIS — N390 Urinary tract infection, site not specified: Secondary | ICD-10-CM | POA: Diagnosis not present

## 2023-07-30 DIAGNOSIS — E1165 Type 2 diabetes mellitus with hyperglycemia: Secondary | ICD-10-CM | POA: Diagnosis not present

## 2023-07-30 DIAGNOSIS — S32010D Wedge compression fracture of first lumbar vertebra, subsequent encounter for fracture with routine healing: Secondary | ICD-10-CM | POA: Diagnosis not present

## 2023-07-30 DIAGNOSIS — N184 Chronic kidney disease, stage 4 (severe): Secondary | ICD-10-CM | POA: Diagnosis not present

## 2023-07-30 DIAGNOSIS — B961 Klebsiella pneumoniae [K. pneumoniae] as the cause of diseases classified elsewhere: Secondary | ICD-10-CM | POA: Diagnosis not present

## 2023-07-30 DIAGNOSIS — I69351 Hemiplegia and hemiparesis following cerebral infarction affecting right dominant side: Secondary | ICD-10-CM | POA: Diagnosis not present

## 2023-07-30 DIAGNOSIS — E1159 Type 2 diabetes mellitus with other circulatory complications: Secondary | ICD-10-CM | POA: Diagnosis not present

## 2023-08-12 DIAGNOSIS — E1122 Type 2 diabetes mellitus with diabetic chronic kidney disease: Secondary | ICD-10-CM | POA: Diagnosis not present

## 2023-08-12 DIAGNOSIS — I129 Hypertensive chronic kidney disease with stage 1 through stage 4 chronic kidney disease, or unspecified chronic kidney disease: Secondary | ICD-10-CM | POA: Diagnosis not present

## 2023-08-12 DIAGNOSIS — E1165 Type 2 diabetes mellitus with hyperglycemia: Secondary | ICD-10-CM | POA: Diagnosis not present

## 2023-08-12 DIAGNOSIS — B961 Klebsiella pneumoniae [K. pneumoniae] as the cause of diseases classified elsewhere: Secondary | ICD-10-CM | POA: Diagnosis not present

## 2023-08-12 DIAGNOSIS — N390 Urinary tract infection, site not specified: Secondary | ICD-10-CM | POA: Diagnosis not present

## 2023-08-12 DIAGNOSIS — I69351 Hemiplegia and hemiparesis following cerebral infarction affecting right dominant side: Secondary | ICD-10-CM | POA: Diagnosis not present

## 2023-08-12 DIAGNOSIS — N184 Chronic kidney disease, stage 4 (severe): Secondary | ICD-10-CM | POA: Diagnosis not present

## 2023-08-12 DIAGNOSIS — S32010D Wedge compression fracture of first lumbar vertebra, subsequent encounter for fracture with routine healing: Secondary | ICD-10-CM | POA: Diagnosis not present

## 2023-08-12 DIAGNOSIS — E1159 Type 2 diabetes mellitus with other circulatory complications: Secondary | ICD-10-CM | POA: Diagnosis not present

## 2023-08-20 ENCOUNTER — Inpatient Hospital Stay (HOSPITAL_COMMUNITY)
Admission: EM | Admit: 2023-08-20 | Discharge: 2023-08-22 | DRG: 305 | Disposition: A | Discharge status: Home / Self Care | Payer: Medicare PPO | Attending: Internal Medicine | Admitting: Internal Medicine

## 2023-08-20 ENCOUNTER — Other Ambulatory Visit: Payer: Self-pay

## 2023-08-20 ENCOUNTER — Inpatient Hospital Stay (HOSPITAL_COMMUNITY): Payer: Medicare PPO

## 2023-08-20 ENCOUNTER — Emergency Department (HOSPITAL_COMMUNITY): Payer: Medicare PPO

## 2023-08-20 ENCOUNTER — Encounter (HOSPITAL_COMMUNITY): Payer: Self-pay

## 2023-08-20 DIAGNOSIS — Z833 Family history of diabetes mellitus: Secondary | ICD-10-CM

## 2023-08-20 DIAGNOSIS — Z820 Family history of epilepsy and other diseases of the nervous system: Secondary | ICD-10-CM | POA: Diagnosis not present

## 2023-08-20 DIAGNOSIS — H9190 Unspecified hearing loss, unspecified ear: Secondary | ICD-10-CM | POA: Diagnosis present

## 2023-08-20 DIAGNOSIS — F32A Depression, unspecified: Secondary | ICD-10-CM | POA: Diagnosis not present

## 2023-08-20 DIAGNOSIS — I10 Essential (primary) hypertension: Secondary | ICD-10-CM | POA: Diagnosis not present

## 2023-08-20 DIAGNOSIS — R410 Disorientation, unspecified: Secondary | ICD-10-CM | POA: Diagnosis not present

## 2023-08-20 DIAGNOSIS — F419 Anxiety disorder, unspecified: Secondary | ICD-10-CM | POA: Diagnosis present

## 2023-08-20 DIAGNOSIS — Z7901 Long term (current) use of anticoagulants: Secondary | ICD-10-CM | POA: Diagnosis not present

## 2023-08-20 DIAGNOSIS — G9340 Encephalopathy, unspecified: Principal | ICD-10-CM

## 2023-08-20 DIAGNOSIS — N1832 Chronic kidney disease, stage 3b: Secondary | ICD-10-CM | POA: Diagnosis not present

## 2023-08-20 DIAGNOSIS — I480 Paroxysmal atrial fibrillation: Secondary | ICD-10-CM | POA: Diagnosis present

## 2023-08-20 DIAGNOSIS — R4182 Altered mental status, unspecified: Secondary | ICD-10-CM | POA: Diagnosis not present

## 2023-08-20 DIAGNOSIS — I161 Hypertensive emergency: Principal | ICD-10-CM | POA: Diagnosis present

## 2023-08-20 DIAGNOSIS — E1122 Type 2 diabetes mellitus with diabetic chronic kidney disease: Secondary | ICD-10-CM | POA: Diagnosis not present

## 2023-08-20 DIAGNOSIS — Z809 Family history of malignant neoplasm, unspecified: Secondary | ICD-10-CM | POA: Diagnosis not present

## 2023-08-20 DIAGNOSIS — I69320 Aphasia following cerebral infarction: Secondary | ICD-10-CM | POA: Diagnosis not present

## 2023-08-20 DIAGNOSIS — I69351 Hemiplegia and hemiparesis following cerebral infarction affecting right dominant side: Secondary | ICD-10-CM | POA: Diagnosis not present

## 2023-08-20 DIAGNOSIS — I129 Hypertensive chronic kidney disease with stage 1 through stage 4 chronic kidney disease, or unspecified chronic kidney disease: Secondary | ICD-10-CM | POA: Diagnosis present

## 2023-08-20 DIAGNOSIS — Z79899 Other long term (current) drug therapy: Secondary | ICD-10-CM

## 2023-08-20 DIAGNOSIS — Z794 Long term (current) use of insulin: Secondary | ICD-10-CM

## 2023-08-20 DIAGNOSIS — I6782 Cerebral ischemia: Secondary | ICD-10-CM | POA: Diagnosis not present

## 2023-08-20 DIAGNOSIS — Z888 Allergy status to other drugs, medicaments and biological substances status: Secondary | ICD-10-CM | POA: Diagnosis not present

## 2023-08-20 DIAGNOSIS — I674 Hypertensive encephalopathy: Secondary | ICD-10-CM | POA: Diagnosis present

## 2023-08-20 DIAGNOSIS — I169 Hypertensive crisis, unspecified: Secondary | ICD-10-CM | POA: Diagnosis present

## 2023-08-20 DIAGNOSIS — Z8249 Family history of ischemic heart disease and other diseases of the circulatory system: Secondary | ICD-10-CM | POA: Diagnosis not present

## 2023-08-20 DIAGNOSIS — Z886 Allergy status to analgesic agent status: Secondary | ICD-10-CM | POA: Diagnosis not present

## 2023-08-20 DIAGNOSIS — E876 Hypokalemia: Secondary | ICD-10-CM | POA: Diagnosis present

## 2023-08-20 DIAGNOSIS — Z885 Allergy status to narcotic agent status: Secondary | ICD-10-CM

## 2023-08-20 DIAGNOSIS — E785 Hyperlipidemia, unspecified: Secondary | ICD-10-CM | POA: Diagnosis present

## 2023-08-20 LAB — URINALYSIS, ROUTINE W REFLEX MICROSCOPIC
Bilirubin Urine: NEGATIVE
Glucose, UA: NEGATIVE mg/dL
Hgb urine dipstick: NEGATIVE
Ketones, ur: NEGATIVE mg/dL
Nitrite: NEGATIVE
Protein, ur: >=300 mg/dL — AB
Specific Gravity, Urine: 1.011 (ref 1.005–1.030)
pH: 7 (ref 5.0–8.0)

## 2023-08-20 LAB — CBC WITH DIFFERENTIAL/PLATELET
Abs Immature Granulocytes: 0.03 10*3/uL (ref 0.00–0.07)
Basophils Absolute: 0 10*3/uL (ref 0.0–0.1)
Basophils Relative: 1 %
Eosinophils Absolute: 0.2 10*3/uL (ref 0.0–0.5)
Eosinophils Relative: 3 %
HCT: 40.7 % (ref 36.0–46.0)
Hemoglobin: 12.9 g/dL (ref 12.0–15.0)
Immature Granulocytes: 1 %
Lymphocytes Relative: 40 %
Lymphs Abs: 2.3 10*3/uL (ref 0.7–4.0)
MCH: 31.6 pg (ref 26.0–34.0)
MCHC: 31.7 g/dL (ref 30.0–36.0)
MCV: 99.8 fL (ref 80.0–100.0)
Monocytes Absolute: 0.7 10*3/uL (ref 0.1–1.0)
Monocytes Relative: 12 %
Neutro Abs: 2.6 10*3/uL (ref 1.7–7.7)
Neutrophils Relative %: 43 %
Platelets: 256 10*3/uL (ref 150–400)
RBC: 4.08 MIL/uL (ref 3.87–5.11)
RDW: 15.2 % (ref 11.5–15.5)
WBC: 5.9 10*3/uL (ref 4.0–10.5)
nRBC: 0 % (ref 0.0–0.2)

## 2023-08-20 LAB — I-STAT VENOUS BLOOD GAS, ED
Acid-Base Excess: 2 mmol/L (ref 0.0–2.0)
Bicarbonate: 27.4 mmol/L (ref 20.0–28.0)
Calcium, Ion: 1.09 mmol/L — ABNORMAL LOW (ref 1.15–1.40)
HCT: 41 % (ref 36.0–46.0)
Hemoglobin: 13.9 g/dL (ref 12.0–15.0)
O2 Saturation: 52 %
Potassium: 3.6 mmol/L (ref 3.5–5.1)
Sodium: 143 mmol/L (ref 135–145)
TCO2: 29 mmol/L (ref 22–32)
pCO2, Ven: 43.6 mm[Hg] — ABNORMAL LOW (ref 44–60)
pH, Ven: 7.406 (ref 7.25–7.43)
pO2, Ven: 28 mm[Hg] — CL (ref 32–45)

## 2023-08-20 LAB — COMPREHENSIVE METABOLIC PANEL
ALT: 11 U/L (ref 0–44)
AST: 19 U/L (ref 15–41)
Albumin: 3.3 g/dL — ABNORMAL LOW (ref 3.5–5.0)
Alkaline Phosphatase: 52 U/L (ref 38–126)
Anion gap: 12 (ref 5–15)
BUN: 17 mg/dL (ref 8–23)
CO2: 24 mmol/L (ref 22–32)
Calcium: 8.9 mg/dL (ref 8.9–10.3)
Chloride: 105 mmol/L (ref 98–111)
Creatinine, Ser: 1.72 mg/dL — ABNORMAL HIGH (ref 0.44–1.00)
GFR, Estimated: 29 mL/min — ABNORMAL LOW (ref >60)
Glucose, Bld: 155 mg/dL — ABNORMAL HIGH (ref 70–99)
Potassium: 3.5 mmol/L (ref 3.5–5.1)
Sodium: 141 mmol/L (ref 135–145)
Total Bilirubin: 0.8 mg/dL (ref 0.0–1.2)
Total Protein: 7.4 g/dL (ref 6.5–8.1)

## 2023-08-20 LAB — I-STAT CHEM 8, ED
BUN: 21 mg/dL (ref 8–23)
Calcium, Ion: 1.08 mmol/L — ABNORMAL LOW (ref 1.15–1.40)
Chloride: 106 mmol/L (ref 98–111)
Creatinine, Ser: 1.7 mg/dL — ABNORMAL HIGH (ref 0.44–1.00)
Glucose, Bld: 155 mg/dL — ABNORMAL HIGH (ref 70–99)
HCT: 42 % (ref 36.0–46.0)
Hemoglobin: 14.3 g/dL (ref 12.0–15.0)
Potassium: 3.6 mmol/L (ref 3.5–5.1)
Sodium: 143 mmol/L (ref 135–145)
TCO2: 25 mmol/L (ref 22–32)

## 2023-08-20 LAB — PROTIME-INR
INR: 1.3 — ABNORMAL HIGH (ref 0.8–1.2)
Prothrombin Time: 16.1 s — ABNORMAL HIGH (ref 11.4–15.2)

## 2023-08-20 LAB — MRSA NEXT GEN BY PCR, NASAL: MRSA by PCR Next Gen: NOT DETECTED

## 2023-08-20 LAB — MAGNESIUM: Magnesium: 1.1 mg/dL — ABNORMAL LOW (ref 1.7–2.4)

## 2023-08-20 LAB — APTT: aPTT: 37 s — ABNORMAL HIGH (ref 24–36)

## 2023-08-20 LAB — GLUCOSE, CAPILLARY
Glucose-Capillary: 125 mg/dL — ABNORMAL HIGH (ref 70–99)
Glucose-Capillary: 167 mg/dL — ABNORMAL HIGH (ref 70–99)

## 2023-08-20 MED ORDER — FOSFOMYCIN TROMETHAMINE 3 G PO PACK
3.0000 g | PACK | Freq: Once | ORAL | Status: AC
  Administered 2023-08-20: 3 g via ORAL
  Filled 2023-08-20: qty 3

## 2023-08-20 MED ORDER — CHLORHEXIDINE GLUCONATE CLOTH 2 % EX PADS: 6.0000 | MEDICATED_PAD | Freq: Every day | CUTANEOUS | Status: DC

## 2023-08-20 MED ORDER — AMLODIPINE BESYLATE 10 MG PO TABS
10.0000 mg | ORAL_TABLET | Freq: Every day | ORAL | Status: DC
  Administered 2023-08-20 – 2023-08-22 (×3): 10 mg via ORAL
  Filled 2023-08-20 (×3): qty 1

## 2023-08-20 MED ORDER — ATORVASTATIN CALCIUM 40 MG PO TABS
40.0000 mg | ORAL_TABLET | Freq: Every day | ORAL | Status: DC
  Administered 2023-08-20 – 2023-08-22 (×3): 40 mg via ORAL
  Filled 2023-08-20 (×3): qty 1

## 2023-08-20 MED ORDER — HYDROCHLOROTHIAZIDE 12.5 MG PO TABS
12.5000 mg | ORAL_TABLET | Freq: Every day | ORAL | Status: DC
  Administered 2023-08-20 – 2023-08-22 (×3): 12.5 mg via ORAL
  Filled 2023-08-20 (×3): qty 1

## 2023-08-20 MED ORDER — SODIUM CHLORIDE 0.9 % IV SOLN
INTRAVENOUS | Status: DC
Start: 2023-08-20 — End: 2023-08-20

## 2023-08-20 MED ORDER — NICARDIPINE HCL IN NACL 20-0.86 MG/200ML-% IV SOLN
3.0000 mg/h | INTRAVENOUS | Status: DC
  Administered 2023-08-20: 5 mg/h via INTRAVENOUS
  Filled 2023-08-20: qty 200

## 2023-08-20 MED ORDER — PANTOPRAZOLE SODIUM 40 MG PO TBEC
40.0000 mg | DELAYED_RELEASE_TABLET | Freq: Every day | ORAL | Status: DC
  Administered 2023-08-20 – 2023-08-22 (×3): 40 mg via ORAL
  Filled 2023-08-20 (×3): qty 1

## 2023-08-20 MED ORDER — LORAZEPAM 0.5 MG PO TABS
0.5000 mg | ORAL_TABLET | Freq: Two times a day (BID) | ORAL | Status: DC | PRN
  Administered 2023-08-20: 0.5 mg via ORAL
  Filled 2023-08-20 (×2): qty 1

## 2023-08-20 MED ORDER — LABETALOL HCL 5 MG/ML IV SOLN
10.0000 mg | Freq: Once | INTRAVENOUS | Status: AC
  Administered 2023-08-20: 10 mg via INTRAVENOUS
  Filled 2023-08-20: qty 4

## 2023-08-20 MED ORDER — ACETAMINOPHEN 325 MG PO TABS: 650.0000 mg | ORAL_TABLET | ORAL | Status: DC | PRN

## 2023-08-20 MED ORDER — DOCUSATE SODIUM 100 MG PO CAPS: 100.0000 mg | ORAL_CAPSULE | Freq: Two times a day (BID) | ORAL | Status: DC | PRN

## 2023-08-20 MED ORDER — CHLORHEXIDINE GLUCONATE CLOTH 2 % EX PADS
6.0000 | MEDICATED_PAD | Freq: Every day | CUTANEOUS | Status: DC
  Administered 2023-08-21: 6 via TOPICAL

## 2023-08-20 MED ORDER — ORAL CARE MOUTH RINSE: 15.0000 mL | OROMUCOSAL | Status: DC | PRN

## 2023-08-20 MED ORDER — APIXABAN 2.5 MG PO TABS
2.5000 mg | ORAL_TABLET | Freq: Two times a day (BID) | ORAL | Status: DC
  Administered 2023-08-20 – 2023-08-22 (×5): 2.5 mg via ORAL
  Filled 2023-08-20 (×7): qty 1

## 2023-08-20 MED ORDER — POLYETHYLENE GLYCOL 3350 17 G PO PACK: 17.0000 g | PACK | Freq: Every day | ORAL | Status: DC | PRN

## 2023-08-20 MED ORDER — INSULIN ASPART 100 UNIT/ML IJ SOLN
0.0000 [IU] | Freq: Every day | INTRAMUSCULAR | Status: DC
Start: 2023-08-20 — End: 2023-08-22

## 2023-08-20 MED ORDER — INSULIN ASPART 100 UNIT/ML IJ SOLN
0.0000 [IU] | Freq: Three times a day (TID) | INTRAMUSCULAR | Status: DC
  Administered 2023-08-20: 2 [IU] via SUBCUTANEOUS
  Administered 2023-08-21: 3 [IU] via SUBCUTANEOUS
  Administered 2023-08-21: 5 [IU] via SUBCUTANEOUS

## 2023-08-20 NOTE — ED Provider Notes (Signed)
  Physical Exam  BP (!) 193/99   Pulse 84   Temp (!) 97.2 F (36.2 C) (Temporal)   Resp 17   Ht 5' 5 (1.651 m)   Wt 71 kg   SpO2 100%   BMI 26.05 kg/m   Physical Exam Vitals and nursing note reviewed.  Constitutional:      General: She is not in acute distress.    Appearance: She is well-developed.  HENT:     Head: Normocephalic and atraumatic.  Eyes:     Conjunctiva/sclera: Conjunctivae normal.  Cardiovascular:     Rate and Rhythm: Normal rate and regular rhythm.     Heart sounds: No murmur heard. Pulmonary:     Effort: Pulmonary effort is normal. No respiratory distress.     Breath sounds: Normal breath sounds.  Abdominal:     Palpations: Abdomen is soft.     Tenderness: There is no abdominal tenderness.  Musculoskeletal:        General: No swelling.     Cervical back: Neck supple.  Skin:    General: Skin is warm and dry.     Capillary Refill: Capillary refill takes less than 2 seconds.  Neurological:     Mental Status: She is alert. She is disoriented.  Psychiatric:        Mood and Affect: Mood normal.     Procedures  .Critical Care  Performed by: Albertina Dixon, MD Authorized by: Albertina Dixon, MD   Critical care provider statement:    Critical care time (minutes):  30   Critical care was necessary to treat or prevent imminent or life-threatening deterioration of the following conditions: HTN emergency requiring continuous drips.   Critical care was time spent personally by me on the following activities:  Development of treatment plan with patient or surrogate, discussions with consultants, evaluation of patient's response to treatment, examination of patient, ordering and review of laboratory studies, ordering and review of radiographic studies, ordering and performing treatments and interventions, pulse oximetry, re-evaluation of patient's condition and review of old charts   ED Course / MDM    Medical Decision Making Amount and/or Complexity of  Data Reviewed Labs: ordered. Radiology: ordered.  Risk Prescription drug management. Decision regarding hospitalization.   Patient received in handoff.  Hypertensive emergency with altered mental status.  Pending laboratory evaluation and imaging studies at time of signout.  Laboratory valuation with a creatinine of 1.72 which is near patient's baseline.  Urinalysis with possible infection but does have 11-20 squamous cells.  Urine culture sent and we will cover with fosfomycin.  CT head with no acute findings.  Patient remained significant hypertensive despite 2 doses of IV labetalol .  Nicardipine  started and MRI ordered as patient did have a stroke last time with this presentation.  Spoke with the intensivist on-call who will admit the patient to the ICU.  Patient admitted       Albertina Dixon, MD 08/20/23 [phone]

## 2023-08-20 NOTE — ED Notes (Signed)
 Patient transported to CT

## 2023-08-20 NOTE — Progress Notes (Signed)
 PHARMACY - ANTICOAGULATION CONSULT NOTE  Pharmacy Consult for Eliquis   Indication: atrial fibrillation  Allergies  Allergen Reactions   Codeine Other (See Comments)    Paranoid  Hallucinations    Dilaudid  [Hydromorphone ] Anxiety    Hallucinations, agitation, paranoia    Morphine Anxiety    Hallucinations, paranoia, agitation   Colcrys [Colchicine] Nausea Only   Zyloprim [Allopurinol] Nausea Only   Nsaids Other (See Comments)    Stage 3 kidney disease    Patient Measurements: Height: 5' 5 (165.1 cm) Weight: 71 kg (156 lb 8.4 oz) IBW/kg (Calculated) : 57  Vital Signs: Temp: 97.4 F (36.3 C) (01/07 1023) Temp Source: Temporal (01/07 1023) BP: 163/84 (01/07 1030) Pulse Rate: 98 (01/07 1030)  Labs: Recent Labs    08/20/23 0645 08/20/23 0652 08/20/23 0653  HGB 12.9 13.9 14.3  HCT 40.7 41.0 42.0  PLT 256  --   --   CREATININE 1.72*  --  1.70*    Estimated Creatinine Clearance: 24.3 mL/min (A) (by C-G formula based on SCr of 1.7 mg/dL (H)).   Medical History: Past Medical History:  Diagnosis Date   Anxiety 03/23/2022   Chronic kidney disease    Diabetes mellitus without complication (HCC)    Headache    Hyperlipidemia    Hypertension    Right hemiparesis (HCC)    Small bowel obstruction (HCC)    Stroke (HCC)    x2   Assessment: Eliquis  PTA for Afib, last dose on 1/6 PM. Admitted for HTN emergency. CBC stable. Scr: 1.70, age > 63.   Plan:  Resume home Eliquis  2.5mg  BID.  Monitor for s/sx of bleeding.   Powell Blush, PharmD, BCCCP  08/20/2023,12:13 PM

## 2023-08-20 NOTE — H&P (Signed)
 NAME:  Donna Green, MRN:  [id number], DOB:  [birth year], LOS: 0 ADMISSION DATE:  08/20/2023, CONSULTATION DATE: 08/20/2023  REFERRING MD: Emergency department physician, CHIEF COMPLAINT: Acute hypertension  History of Present Illness:  83 year old female with a plethora of health issues that are well-documented below with a history of stroke and recent hypertensive crisis event.  She went to bed as noted to be normal woke up with confusion on admission to the hospital she is extremely hypertensive as she received labetalol  x 2 and was started on a Cardene  drip.  By the time critical care arrived at bed her blood pressure on Cardene  was 147/64.  Cardene  was decreased.  She will be admitted to the intensive care unit for further evaluation and treatment.  Pertinent  Medical History   Past Medical History:  Diagnosis Date   Anxiety 03/23/2022   Chronic kidney disease    Diabetes mellitus without complication (HCC)    Headache    Hyperlipidemia    Hypertension    Right hemiparesis (HCC)    Small bowel obstruction (HCC)    Stroke (HCC)    x2     Significant Hospital Events: Including procedures, antibiotic start and stop dates in addition to other pertinent events     Interim History / Subjective:  Admitted with a systolic pressure greater than 200 now was at 140 on Cardene  drip  Objective   Blood pressure (!) 164/81, pulse 97, temperature (!) 97.4 F (36.3 C), temperature source Temporal, resp. rate 16, height 5' 5 (1.651 m), weight 71 kg, SpO2 99%.       No intake or output data in the 24 hours ending 08/20/23 1032 Filed Weights   08/20/23 0641  Weight: 71 kg    Examination: General: Elderly female extremely hard of hearing but can follow commands HENT: No JVD or lymphadenopathy is appreciated Lungs: Diminished breath sounds in the bases Cardiovascular: Heart sounds are are regular Abdomen: Obese soft nontender Extremities: Warm with mild edema Neuro: Grossly intact,  extremely hard of hearing which interferes with neurological exam GU: Voids  Resolved Hospital Problem list     Assessment & Plan:  Hypertensive emergency with labetalol  and Cardene  and now controlled Wean Cardene  Resume all home antihypertensives Negative head CT MRI has been ordered Admit to the intensive care unit Consider neurological consult  Diabetes mellitus, she is on NovoLog  Lantus  at home CBG (last 3)  No results for input(s): GLUCAP in the last 72 hours. Every 4 hours sliding scale insulin  protocol.  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 Continue to monitor  Goals of care discussion as needed     Best Practice (right click and Reselect all SmartList Selections daily)   Diet/type: NPO DVT prophylaxis not indicated Pressure ulcer(s): N/A GI prophylaxis: N/A Lines: N/A Foley:  N/A Code Status:  full code Last date of multidisciplinary goals of care discussion [tbd]  Labs   CBC: Recent Labs  Lab 08/20/23 0645 08/20/23 0652 08/20/23 0653  WBC 5.9  --   --   NEUTROABS 2.6  --   --   HGB 12.9 13.9 14.3  HCT 40.7 41.0 42.0  MCV 99.8  --   --   PLT 256  --   --     Basic Metabolic Panel: Recent Labs  Lab 08/20/23 0645 08/20/23 0652 08/20/23 0653  NA 141 143 143  K 3.5 3.6 3.6  CL 105  --  106  CO2 24  --   --   GLUCOSE 155*  --  155*  BUN 17  --  21  CREATININE 1.72*  --  1.70*  CALCIUM  8.9  --   --   MG 1.1*  --   --    GFR: Estimated Creatinine Clearance: 24.3 mL/min (A) (by C-G formula based on SCr of 1.7 mg/dL (H)). Recent Labs  Lab 08/20/23 0645   WBC 5.9    Liver Function Tests: Recent Labs  Lab 08/20/23 0645  AST 19  ALT 11  ALKPHOS 52  BILITOT 0.8  PROT 7.4  ALBUMIN 3.3*   No results for input(s): LIPASE, AMYLASE in the last 168 hours. No results for input(s): AMMONIA in the last 168 hours.  ABG    Component Value Date/Time   HCO3 27.4 08/20/2023 0652   TCO2 25 08/20/2023 0653   O2SAT 52 08/20/2023 0652     Coagulation Profile: No results for input(s): INR, PROTIME in the last 168 hours.  Cardiac Enzymes: No results for input(s): CKTOTAL, CKMB, CKMBINDEX, TROPONINI in the last 168 hours.  HbA1C: Hgb A1c MFr Bld  Date/Time Value Ref Range Status  06/03/2023 02:54 AM 7.7 (H) 4.8 - 5.6 % Final    Comment:    (NOTE) Pre diabetes:          5.7%-6.4%  Diabetes:              >6.4%  Glycemic control for   <7.0% adults with diabetes   03/23/2022 03:27 PM 9.2 (H) 4.8 - 5.6 % Final    Comment:    (NOTE) Pre diabetes:          5.7%-6.4%  Diabetes:              >6.4%  Glycemic control for   <7.0% adults with diabetes     CBG: No results for input(s): GLUCAP in the last 168 hours.  Review of Systems:   na  Past Medical History:  She,  has a past medical history of Anxiety (03/23/2022), Chronic kidney disease, Diabetes mellitus without complication (HCC), Headache, Hyperlipidemia, Hypertension, Right hemiparesis (HCC), Small bowel obstruction (HCC), and Stroke (HCC).   Surgical History:   Past Surgical History:  Procedure Laterality Date   BLADDER SURGERY     BOWEL RESECTION     EYE SURGERY     TUBAL LIGATION       Social History:   reports that she has never smoked. She has never used smokeless tobacco. She reports that she does not drink alcohol and does not use drugs.   Family History:  Her family history includes Cancer in her brother; Diabetes in her brother, maternal grandmother, mother, and sister; Heart disease in her mother; Parkinsonism in her father.    Allergies Allergies  Allergen Reactions   Codeine Other (See Comments)    Paranoid  Hallucinations    Dilaudid  [Hydromorphone ] Anxiety    Hallucinations, agitation, paranoia    Morphine Anxiety    Hallucinations, paranoia, agitation   Colcrys [Colchicine] Nausea Only   Zyloprim [Allopurinol] Nausea Only   Nsaids Other (See Comments)    Stage 3 kidney disease  Home Medications  Prior to Admission medications   Medication Sig Start Date End Date Taking? Authorizing Provider  acetaminophen  (TYLENOL ) 325 MG tablet Take 650 mg by mouth 2 (two) times daily as needed for mild pain (pain score 1-3) or moderate pain (pain score 4-6).   Yes [provider]  apixaban  (ELIQUIS ) 2.5 MG TABS tablet Take 1 tablet (2.5 mg total) by mouth 2 (two) times daily. 06/10/23  Yes Raenelle Coria, MD  atorvastatin  (LIPITOR) 40 MG tablet Take 1 tablet (40 mg total) by mouth daily. 06/26/23 06/25/24 Yes Perri DELENA Meliton Mickey., MD  calcium  carbonate (TUMS EX) 750 MG chewable tablet Chew 1 tablet by mouth daily as needed for heartburn.   Yes [provider]  carvedilol  (COREG ) 12.5 MG tablet Take 1 tablet (12.5 mg total) by mouth 2 (two) times daily with a meal. 06/26/23 08/20/23 Yes Perri DELENA Meliton Mickey., MD  insulin  aspart (NOVOLOG  FLEXPEN) 100 UNIT/ML FlexPen Inject 8 Units into the skin 3 (three) times daily with meals.   Yes [provider]  LANTUS  SOLOSTAR 100 UNIT/ML Solostar Pen Inject 15 Units into the skin at bedtime. 07/02/23  Yes [provider]  LORazepam  (ATIVAN ) 0.5 MG tablet Take 1 tablet (0.5 mg total) by mouth every 8 (eight) hours as needed for anxiety. Patient taking differently: Take 0.5 mg by mouth in the morning and at bedtime. 06/10/23  Yes Ghimire, Kuber, MD  magnesium  oxide (MAG-OX) 400 MG tablet Take 400 mg by mouth daily. 06/26/23  Yes [provider]  polyethylene glycol (MIRALAX  / GLYCOLAX ) 17 g packet Take 17 g by mouth daily. Patient  taking differently: Take 17 g by mouth daily as needed for mild constipation or moderate constipation. 06/11/23  Yes Raenelle Coria, MD     Critical care time: 35 min     Marcey Millena Callins ACNP Acute Care Nurse Practitioner [facility] Please consult Amion 08/20/2023, 10:32 AM

## 2023-08-20 NOTE — ED Triage Notes (Signed)
 Pt to ED via [facility] from home. Pt's family awoke to patient yelling for them. Pt was confused. Pt A&Ox4, NAD noted on arrival. GCS = 15. NIHSS=0  EMS VS 250/120  HR 96 97% RA Cbg 172 22g R hand

## 2023-08-20 NOTE — ED Provider Notes (Signed)
 [place] [facility] AT [facility] Provider Note   CSN: [id number] Arrival date & time: 08/20/23  9360     History  Chief Complaint  Patient presents with   Altered Mental Status    Donna Green is a 83 y.o. female.  HPI Patient presents for altered mental status.  Medical history includes HTN, DM, atrial fibrillation, CVA, CKD, anxiety, HLD.  She was admitted 2 months ago for encephalopathy.  She was found to have punctate ischemic infarcts on MRI.  She is on Eliquis .  Last known well was midnight.  This morning, patient's daughter noticed that patient was confused.  With EMS, patient is oriented only to self.  She is reportedly alert and oriented x 4 at baseline.  Vital signs prior to arrival notable for hypertension with SBP in the range of 240.  CBG was normal.  Currently, patient endorses some dizziness.  She denies any areas of discomfort.    Home Medications Prior to Admission medications   Medication Sig Start Date End Date Taking? Authorizing Provider  acetaminophen  (TYLENOL ) 325 MG tablet Take 650 mg by mouth 2 (two) times daily as needed for mild pain (pain score 1-3) or moderate pain (pain score 4-6).    [provider]  apixaban  (ELIQUIS ) 2.5 MG TABS tablet Take 1 tablet (2.5 mg total) by mouth 2 (two) times daily. 06/10/23   Raenelle Coria, MD  atorvastatin  (LIPITOR) 40 MG tablet Take 1 tablet (40 mg total) by mouth daily. 06/26/23 06/25/24  Perri DELENA Meliton Mickey., MD  calcium  carbonate (TUMS EX) 750 MG chewable tablet Chew 1 tablet by mouth daily as needed for heartburn.    [provider]  carvedilol  (COREG ) 12.5 MG tablet Take 1 tablet (12.5 mg total) by mouth 2 (two) times daily with a meal. 06/26/23 07/26/23  Perri DELENA Meliton Mickey., MD  insulin  aspart (NOVOLOG  FLEXPEN) 100 UNIT/ML FlexPen Inject 8 Units into the skin 3 (three) times daily with meals.    [provider]  insulin  glargine (LANTUS ) 100 UNIT/ML injection  Inject 0.15 mLs (15 Units total) into the skin at bedtime. 06/26/23   Perri DELENA Meliton Mickey., MD  LORazepam  (ATIVAN ) 0.5 MG tablet Take 1 tablet (0.5 mg total) by mouth every 8 (eight) hours as needed for anxiety. Patient taking differently: Take 0.5 mg by mouth in the morning and at bedtime. 06/10/23   Ghimire, Kuber, MD  polyethylene glycol (MIRALAX  / GLYCOLAX ) 17 g packet Take 17 g by mouth daily. Patient taking differently: Take 17 g by mouth daily as needed for mild constipation or moderate constipation. 06/11/23   Raenelle Coria, MD      Allergies    Codeine, Dilaudid  [hydromorphone ], Morphine, Colcrys [colchicine], Zyloprim [allopurinol], and Nsaids    Review of Systems   Review of Systems  Unable to perform ROS: Mental status change  Neurological:  Positive for dizziness.  Psychiatric/Behavioral:  Positive for confusion.     Physical Exam Updated Vital Signs BP (!) 231/133   Pulse 95   Temp (!) 97.2 F (36.2 C) (Temporal)   Resp 18   Ht 5' 5 (1.651 m)   Wt 71 kg   SpO2 100%   BMI 26.05 kg/m  Physical Exam Vitals and nursing note reviewed.  Constitutional:      General: She is not in acute distress.    Appearance: Normal appearance. She is well-developed and normal weight. She is not ill-appearing, toxic-appearing or diaphoretic.  HENT:  Head: Normocephalic and atraumatic.     Right Ear: External ear normal.     Left Ear: External ear normal.     Nose: Nose normal.     Mouth/Throat:     Mouth: Mucous membranes are moist.  Eyes:     Extraocular Movements: Extraocular movements intact.     Conjunctiva/sclera: Conjunctivae normal.  Cardiovascular:     Rate and Rhythm: Normal rate and regular rhythm.     Heart sounds: No murmur heard. Pulmonary:     Effort: Pulmonary effort is normal. No respiratory distress.     Breath sounds: Normal breath sounds. No wheezing or rales.  Chest:     Chest wall: No tenderness.  Abdominal:     General: There is no distension.      Palpations: Abdomen is soft.     Tenderness: There is no abdominal tenderness.  Musculoskeletal:        General: No swelling. Normal range of motion.     Cervical back: Normal range of motion and neck supple.  Skin:    General: Skin is warm and dry.     Capillary Refill: Capillary refill takes less than 2 seconds.     Coloration: Skin is not jaundiced or pale.  Neurological:     General: No focal deficit present.     Mental Status: She is alert. She is disoriented.     Cranial Nerves: No cranial nerve deficit, dysarthria or facial asymmetry.     Sensory: Sensation is intact. No sensory deficit.     Motor: Tremor (Slight) present. No weakness, abnormal muscle tone or pronator drift.     Coordination: Coordination is intact. Coordination normal. Finger-Nose-Finger Test normal.  Psychiatric:        Mood and Affect: Mood normal.        Behavior: Behavior normal.     ED Results / Procedures / Treatments   Labs (all labs ordered are listed, but only abnormal results are displayed) Labs Reviewed  I-STAT CHEM 8, ED - Abnormal; Notable for the following components:      Result Value   Creatinine, Ser 1.70 (*)    Glucose, Bld 155 (*)    Calcium , Ion 1.08 (*)    All other components within normal limits  I-STAT VENOUS BLOOD GAS, ED - Abnormal; Notable for the following components:   pCO2, Ven 43.6 (*)    pO2, Ven 28 (*)    Calcium , Ion 1.09 (*)    All other components within normal limits  CBC WITH DIFFERENTIAL/PLATELET  COMPREHENSIVE METABOLIC PANEL  URINALYSIS, ROUTINE W REFLEX MICROSCOPIC  MAGNESIUM   CBG MONITORING, ED    EKG EKG Interpretation Date/Time:  Tuesday August 20 2023 06:24:13 EST Ventricular Rate:  92 PR Interval:  144 QRS Duration:  84 QT Interval:  355 QTC Calculation: 440 R Axis:   -11  Text Interpretation: Sinus rhythm LVH with secondary repolarization abnormality Confirmed by Melvenia Motto ([id number]) on 08/20/2023 7:22:12 AM  Radiology No results  found.  Procedures Procedures    Medications Ordered in ED Medications  labetalol  (NORMODYNE ) injection 10 mg (has no administration in time range)  labetalol  (NORMODYNE ) injection 10 mg (10 mg Intravenous Given 08/20/23 9352)    ED Course/ Medical Decision Making/ A&P                                 Medical Decision Making Amount and/or Complexity of Data Reviewed Labs:  ordered. Radiology: ordered.  Risk Prescription drug management.   This patient presents to the ED for concern of altered mental status, this involves an extensive number of treatment options, and is a complaint that carries with it a high risk of complications and morbidity.  The differential diagnosis includes hypertensive crisis, CVA, ICH, polypharmacy, infection, metabolic derangements   Co morbidities that complicate the patient evaluation  HTN, DM, atrial fibrillation, CVA, CKD, anxiety, HLD   Additional history obtained:  Additional history obtained from EMS External records from outside source obtained and reviewed including EMR   Lab Tests:  I Ordered, and personally interpreted labs.  The pertinent results include: Normal hemoglobin, no leukocytosis, baseline creatinine, normal electrolytes   Imaging Studies ordered:  I ordered imaging studies including CT head I independently visualized and interpreted imaging which showed (pending at time of signout) I agree with the radiologist interpretation   Cardiac Monitoring: / EKG:  The patient was maintained on a cardiac monitor.  I personally viewed and interpreted the cardiac monitored which showed an underlying rhythm of: Sinus rhythm  Problem List / ED Course / Critical interventions / Medication management  Patient presenting for altered mental status.  This is in the setting of markedly elevated blood pressure.  EMS noted SBP in the range of 240 prior to arrival.  On arrival, patient is awake and alert.  She is able to tell me her name  but seems disoriented otherwise.  She does not appear to have any focal neurologic deficits on exam.  She denies any current symptoms other than some dizziness.  Current breathing is unlabored.  Abdomen is soft and nontender.  Workup was initiated.  Patient's initial blood pressure in the ED was 250/120.  Dose of labetalol  was ordered.  Blood pressure improved slightly and additional dose of labetalol  was ordered.  Care of patient was signed to oncoming ED provider. I ordered medication including below for hypertension Reevaluation of the patient after these medicines showed that the patient improved I have reviewed the patients home medicines and have made adjustments as needed   Social Determinants of Health:  Lives at home with daughter  CRITICAL CARE Performed by: Bernardino Fireman   Total critical care time: 32 minutes  Critical care time was exclusive of separately billable procedures and treating other patients.  Critical care was necessary to treat or prevent imminent or life-threatening deterioration.  Critical care was time spent personally by me on the following activities: development of treatment plan with patient and/or surrogate as well as nursing, discussions with consultants, evaluation of patient's response to treatment, examination of patient, obtaining history from patient or surrogate, ordering and performing treatments and interventions, ordering and review of laboratory studies, ordering and review of radiographic studies, pulse oximetry and re-evaluation of patient's condition.        Final Clinical Impression(s) / ED Diagnoses Final diagnoses:  Acute encephalopathy  Hypertension, unspecified type    Rx / DC Orders ED Discharge Orders     None         Fireman Bernardino, MD 08/20/23 [phone]

## 2023-08-21 DIAGNOSIS — I169 Hypertensive crisis, unspecified: Secondary | ICD-10-CM | POA: Diagnosis not present

## 2023-08-21 LAB — GLUCOSE, CAPILLARY
Glucose-Capillary: 186 mg/dL — ABNORMAL HIGH (ref 70–99)
Glucose-Capillary: 205 mg/dL — ABNORMAL HIGH (ref 70–99)
Glucose-Capillary: 115 mg/dL — ABNORMAL HIGH (ref 70–99)
Glucose-Capillary: 152 mg/dL — ABNORMAL HIGH (ref 70–99)
Glucose-Capillary: 132 mg/dL — ABNORMAL HIGH (ref 70–99)

## 2023-08-21 LAB — BASIC METABOLIC PANEL
Anion gap: 12 (ref 5–15)
BUN: 20 mg/dL (ref 8–23)
CO2: 22 mmol/L (ref 22–32)
Calcium: 8.4 mg/dL — ABNORMAL LOW (ref 8.9–10.3)
Chloride: 104 mmol/L (ref 98–111)
Creatinine, Ser: 1.73 mg/dL — ABNORMAL HIGH (ref 0.44–1.00)
GFR, Estimated: 29 mL/min — ABNORMAL LOW (ref >60)
Glucose, Bld: 183 mg/dL — ABNORMAL HIGH (ref 70–99)
Potassium: 3.3 mmol/L — ABNORMAL LOW (ref 3.5–5.1)
Sodium: 138 mmol/L (ref 135–145)

## 2023-08-21 LAB — CBC
HCT: 36.6 % (ref 36.0–46.0)
Hemoglobin: 11.7 g/dL — ABNORMAL LOW (ref 12.0–15.0)
MCH: 30.9 pg (ref 26.0–34.0)
MCHC: 32 g/dL (ref 30.0–36.0)
MCV: 96.6 fL (ref 80.0–100.0)
Platelets: 234 10*3/uL (ref 150–400)
RBC: 3.79 MIL/uL — ABNORMAL LOW (ref 3.87–5.11)
RDW: 15.3 % (ref 11.5–15.5)
WBC: 5.1 10*3/uL (ref 4.0–10.5)
nRBC: 0 % (ref 0.0–0.2)

## 2023-08-21 MED ORDER — CARVEDILOL 12.5 MG PO TABS
12.5000 mg | ORAL_TABLET | Freq: Two times a day (BID) | ORAL | Status: DC
  Administered 2023-08-21 – 2023-08-22 (×3): 12.5 mg via ORAL
  Filled 2023-08-21 (×3): qty 1

## 2023-08-21 MED ORDER — INSULIN GLARGINE-YFGN 100 UNIT/ML ~~LOC~~ SOLN
7.0000 [IU] | Freq: Every day | SUBCUTANEOUS | Status: DC
  Administered 2023-08-21 – 2023-08-22 (×2): 7 [IU] via SUBCUTANEOUS
  Filled 2023-08-21 (×2): qty 0.07

## 2023-08-21 MED ORDER — MELATONIN 3 MG PO TABS
3.0000 mg | ORAL_TABLET | Freq: Every day | ORAL | Status: DC
  Administered 2023-08-21: 3 mg via ORAL
  Filled 2023-08-21: qty 1

## 2023-08-21 MED ORDER — POTASSIUM CHLORIDE CRYS ER 20 MEQ PO TBCR
30.0000 meq | EXTENDED_RELEASE_TABLET | ORAL | Status: AC
Start: 2023-08-21 — End: 2023-08-21
  Administered 2023-08-21 (×2): 30 meq via ORAL
  Filled 2023-08-21 (×2): qty 1

## 2023-08-21 MED ORDER — LORAZEPAM 2 MG/ML IJ SOLN: 0.5000 mg | Freq: Four times a day (QID) | INTRAMUSCULAR | Status: DC | PRN

## 2023-08-21 NOTE — Progress Notes (Addendum)
 Patient becoming more confused around 3pm. Spoke to primary physician with the patient and he said to continue to monitor for delirium. IV medications ordered if redirection continues to fail. Daughter updated and at bedside, told this RN that this is a usual thing for her mom when she is in the hospital. Patient sitting comfortably in bed with daughter able to redirect at this moment.

## 2023-08-21 NOTE — Progress Notes (Signed)
 OT Cancellation Note  Patient Details Name: Donna Green MRN: [id number] DOB: 19-Sep-1940   Cancelled Treatment:    Reason Eval/Treat Not Completed: Other (comment) Attempted to see pt for OT evaluation this afternoon although unable to complete. Pt very agitated and unable to redirect with several attempts. Will attempt OT evaluation tomorrow with recommendation to see earlier in the day for best cognition.   Leita Howell, OTR/L,CBIS  Supplemental OT - [facility] Secure Chat Preferred   08/21/2023, 4:29 PM

## 2023-08-21 NOTE — Evaluation (Signed)
 Physical Therapy Evaluation Patient Details Name: Donna Green MRN: [id number] DOB: 10/15/1940 Today's Date: 08/21/2023  History of Present Illness  Pt is 83 yo female who presents on 08/20/23 with AMS, admitted with hypertensive emergency.  MRI negative for acute changes, though history of R frontal and L frontoparietal infarcts in 11/24.  PMH: DM, HLD, HTN, CKD, HOH, PAF on Eliquis , large HH, GAD and CVA x2 with residual R hemiparesis.  Clinical Impression  Patient presents with decreased mobility due to generalized weakness, decreased balance, decreased safety awareness and slower processing.  No family available to indicate baseline cognition though seems mostly able to reports home set up and recent PT episode without difficulty.  Patient with LOB posteriorly today with RW needing min A for balance.  She does report using RW at home.  Feel she will benefit from skilled PT in the acute setting and to resume HHPT at d/c.         If plan is discharge home, recommend the following: A little help with walking and/or transfers;Assistance with cooking/housework;Assist for transportation;Supervision due to cognitive status;Help with stairs or ramp for entrance;A little help with bathing/dressing/bathroom   Can travel by private vehicle        Equipment Recommendations None recommended by PT  Recommendations for Other Services       Functional Status Assessment Patient has had a recent decline in their functional status and demonstrates the ability to make significant improvements in function in a reasonable and predictable amount of time.     Precautions / Restrictions Precautions Precautions: Fall      Mobility  Bed Mobility               General bed mobility comments: in recliner    Transfers Overall transfer level: Needs assistance Equipment used: Rolling walker (2 wheels) Transfers: Sit to/from Stand Sit to Stand: Min assist           General transfer comment:  posterior bias with initial sit to stand needing help for anterior weight shift    Ambulation/Gait Ambulation/Gait assistance: Contact guard assist, Min assist Gait Distance (Feet): 100 Feet Assistive device: Rolling walker (2 wheels) Gait Pattern/deviations: Step-to pattern, Step-through pattern, Decreased stride length, Trunk flexed, Shuffle       General Gait Details: assist to turn walker in hallway though pt turned walker on her own in the room; LOB posterior in the bathroom with uneven surface slanting for shower, min A to recover  Stairs            Wheelchair Mobility     Tilt Bed    Modified Rankin (Stroke Patients Only)       Balance Overall balance assessment: Needs assistance Sitting-balance support: Feet supported Sitting balance-Leahy Scale: Fair   Postural control: Posterior lean Standing balance support: Bilateral upper extremity supported, Reliant on assistive device for balance Standing balance-Leahy Scale: Poor Standing balance comment: UE support for balance, posterior initial standing                             Pertinent Vitals/Pain Pain Assessment Pain Assessment: No/denies pain    Home Living Family/patient expects to be discharged to:: Private residence Living Arrangements: Children Available Help at Discharge: Family;Available 24 hours/day Type of Home: House Home Access: Stairs to enter Entrance Stairs-Rails: Right Entrance Stairs-Number of Steps: 2 Alternate Level Stairs-Number of Steps: 4 Home Layout: Multi-level Home Equipment: Rolling Walker (2 wheels);BSC/3in1;Hand held shower head  Additional Comments: Uses BSC in tub/shower(in daughter's bathroom). Pt has walk in shower in her bathroom    Prior Function Prior Level of Function : Needs assist             Mobility Comments: uses walker, just finished 6 weeks of PT ADLs Comments: help for some ADL's     Extremity/Trunk Assessment   Upper Extremity  Assessment Upper Extremity Assessment: Generalized weakness (reports some tinging in her fingers)    Lower Extremity Assessment Lower Extremity Assessment: Generalized weakness (reports numbness in her feet)    Cervical / Trunk Assessment Cervical / Trunk Assessment: Kyphotic  Communication   Communication Communication: Hearing impairment  Cognition Arousal: Alert Behavior During Therapy: WFL for tasks assessed/performed Overall Cognitive Status: Within Functional Limits for tasks assessed                                 General Comments: did not ask specific orientation questions, she was able to answer home questions and knew she had just finished 6 weeks of PT        General Comments General comments (skin integrity, edema, etc.): on RA SpO2 95% and HR 94 with ambulation; BP 145/93 post ambulation    Exercises     Assessment/Plan    PT Assessment Patient needs continued PT services  PT Problem List Decreased strength;Decreased balance;Decreased cognition;Decreased activity tolerance;Decreased mobility;Decreased safety awareness;Cardiopulmonary status limiting activity       PT Treatment Interventions DME instruction;Therapeutic activities;Cognitive remediation;Gait training;Therapeutic exercise;Patient/family education;Balance training;Functional mobility training;Stair training    PT Goals (Current goals can be found in the Care Plan section)  Acute Rehab PT Goals Patient Stated Goal: return home PT Goal Formulation: With patient Time For Goal Achievement: 09/04/23 Potential to Achieve Goals: Good    Frequency Min 1X/week     Co-evaluation               AM-PAC PT 6 Clicks Mobility  Outcome Measure Help needed turning from your back to your side while in a flat bed without using bedrails?: A Little Help needed moving from lying on your back to sitting on the side of a flat bed without using bedrails?: A Little Help needed moving to and  from a bed to a chair (including a wheelchair)?: A Little Help needed standing up from a chair using your arms (e.g., wheelchair or bedside chair)?: A Little Help needed to walk in hospital room?: A Little Help needed climbing 3-5 steps with a railing? : Total 6 Click Score: 16    End of Session Equipment Utilized During Treatment: Gait belt Activity Tolerance: Patient tolerated treatment well Patient left: in chair;with call bell/phone within reach;with chair alarm set   PT Visit Diagnosis: Other abnormalities of gait and mobility (R26.89);Muscle weakness (generalized) (M62.81);Other symptoms and signs involving the nervous system (R29.898)    Time: 8894-8864 PT Time Calculation (min) (ACUTE ONLY): 30 min   Charges:   PT Evaluation $PT Eval Moderate Complexity: 1 Mod PT Treatments $Gait Training: 8-22 mins PT General Charges $$ ACUTE PT VISIT: 1 Visit         Micheline Portal, PT Acute Rehabilitation Services [phone] 08/21/2023   Montie Portal 08/21/2023, 11:56 AM

## 2023-08-21 NOTE — Plan of Care (Signed)

## 2023-08-21 NOTE — Progress Notes (Addendum)
 PROGRESS NOTE    Donna Green  FMW:[id number] DOB: 04-29-1941 DOA: 08/20/2023 PCP: Rexanne Ingle, MD    Brief Narrative:   Donna Green is a 83 y.o. female with past medical history significant for HTN, DM2, anxiety/depression, HLD, history of CVA, paroxysmal atrial fibrillation on Eliquis , CKD stage IIIb, hard of hearing who presented to [facility] ED on 1/7 from home via EMS with confusion.  Patient was apparently yelling that awoke family members as they noted that she was confused from her typical baseline.  On EMS arrival, patient's blood pressure was noted to be elevated 250/120, heart rate 96, 97% on room air and CBG 172.  Patient was transported to the ED for further evaluation and management.  In the ED, temperature 97.2 F, HR 96, RR 18, BP 231/133, SpO2 100% on room air.  WBC 5.9, hemoglobin 12.9, platelet count 256.  Sodium 141, potassium 3.5, chloride 105, CO2 24, glucose 155, BUN 17, creatinine 1.72.  AST 19, ALT 11, total bilirubin 0.8.  VBG was pH 7.406, pCO2 43.6, pO2 28.  Urinalysis with trace leukocytes, negative nitrite, few bacteria, 11-20 WBCs.  INR 1.3.  CT head without contrast with no acute intracranial findings, noted mild chronic small vessel ischemic change of the hemispheric white matter.  MRI brain without contrast with no acute intracranial abnormality, small remote infarcts right cerebellar hemisphere and paramedian pons, moderate chronic microvascular ischemic changes of the white matter.  Patient was given IV labetalol  x 2 and started on a Cardene  drip.  PCCM was consulted for further evaluation and management of hypertensive emergency.  Significant hospital events: 1/7: Admit to ICU, Cardene  drip, started on amlodipine /HCTZ 1/8: Transferred to [facility], home carvedilol  restarted  Assessment & Plan:   Hypertensive emergency Patient presenting to the ED with confusion, on EMS arrival patient's blood pressure was noted to be elevated 250/120.  At baseline on carvedilol   12.5 mg p.o. twice daily.  Initially requiring IV labetalol  followed by Cardene  drip which was titrated off. -- Started on amlodipine  10 mg p.o. daily -- Started on hydrochlorothiazide  12.5 g p.o. daily -- Restart home carvedilol  12.5 mg p.o. twice daily -- Continue monitor blood pressure and adjust antihypertensives accordingly  Hypokalemia Potassium 3.3, will replete. -- Repeat electrolytes in a.m. include magnesium   Hyperlipidemia -- Atorvastatin  40 mg p.o. daily  Proximal atrial fibrillation on anticoagulation -- Carvedilol  12.5 m p.o. twice daily -- Eliquis  2.5 g p.o. twice daily  CKD stage IIIb -- Cr 1.72>1.70>1.73 (baseline 1.6 - 2.0), stable -- Avoid nephrotoxins, renally dose all medications -- Repeat BMP in a.m.  Type 2 diabetes mellitus --Semglee  70 units Hooker daily (Home: Lantus  15u every day, Novolog  8u TIDAC) -- Moderate SSI for coverage -- CBG before every meal/at bedtime  Anxiety/depression -- Lorazepam  0.5 mg every 12 hours as needed anxiety  GERD -- Protonix  40 mg p.o. daily  Hard of hearing --Supportive care  Weakness/debility/deconditioning: -- PT/OT evaluation: Pending   DVT prophylaxis: apixaban  (ELIQUIS ) tablet 2.5 mg Start: 08/20/23 1300 SCDs Start: 08/20/23 1000 apixaban  (ELIQUIS ) tablet 2.5 mg    Code Status: Full Code Family Communication: No family present at bedside this morning  Disposition Plan:  Level of care: Telemetry Medical Status is: Inpatient Remains inpatient appropriate because: Needs further titration of vent hypertensives, PT/OT evaluation, anticipate discharge home likely tomorrow    Consultants:  PCCM: Signed off 1/8  Procedures:  None  Antimicrobials:  None   Subjective: Patient seen examined bedside, resting calmly.  Lying in bed.  Blood pressure improved.  Remains off Cardene  drip.  RN present at bedside.  Mental status appears to be back at baseline.  No other specific questions, concerns or complaints at  this time.  Denies headache, no visual changes, no chest pain, no palpitations, no shortness of breath, no abdominal pain, no fever/chills/night sweats, no nausea/vomiting/diarrhea, no focal weakness, no fatigue, no paresthesia.  No acute events overnight per nursing staff.  Objective: Vitals:   08/21/23 0800 08/21/23 0900 08/21/23 1100 08/21/23 1200  BP: (!) 166/93 (!) 143/86 (!) 145/75 127/78  Pulse: 86 92 82 79  Resp: 16 11 16 13   Temp: 98.1 F (36.7 C)   98.2 F (36.8 C)  TempSrc: Oral   Oral  SpO2: 94% 94% 95% 94%  Weight:      Height:        Intake/Output Summary (Last 24 hours) at 08/21/2023 1302 Last data filed at 08/21/2023 1100 Gross per 24 hour  Intake 490 ml  Output 375 ml  Net 115 ml   Filed Weights   08/20/23 0641  Weight: 71 kg    Examination:  Physical Exam: GEN: NAD, alert and oriented x 3, wd/wn, hard of hearing HEENT: NCAT, PERRL, EOMI, sclera clear, MMM PULM: CTAB w/o wheezes/crackles, normal respiratory effort, on room air with SpO2 93% at rest CV: RRR w/o M/G/R GI: abd soft, NTND, NABS, no R/G/M MSK: no peripheral edema, muscle strength globally intact 5/5 bilateral upper/lower extremities NEURO: CN II-XII intact, no focal deficits, sensation to light touch intact PSYCH: normal mood/affect Integumentary: dry/intact, no rashes or wounds    Data Reviewed: I have personally reviewed following labs and imaging studies  CBC: Recent Labs  Lab 08/20/23 0645 08/20/23 0652 08/20/23 0653 08/21/23 0552  WBC 5.9  --   --  5.1  NEUTROABS 2.6  --   --   --   HGB 12.9 13.9 14.3 11.7*  HCT 40.7 41.0 42.0 36.6  MCV 99.8  --   --  96.6  PLT 256  --   --  234   Basic Metabolic Panel: Recent Labs  Lab 08/20/23 0645 08/20/23 0652 08/20/23 0653 08/21/23 0552  NA 141 143 143 138  K 3.5 3.6 3.6 3.3*  CL 105  --  106 104  CO2 24  --   --  22  GLUCOSE 155*  --  155* 183*  BUN 17  --  21 20  CREATININE 1.72*  --  1.70* 1.73*  CALCIUM  8.9  --   --  8.4*   MG 1.1*  --   --   --    GFR: Estimated Creatinine Clearance: 23.9 mL/min (A) (by C-G formula based on SCr of 1.73 mg/dL (H)). Liver Function Tests: Recent Labs  Lab 08/20/23 0645  AST 19  ALT 11  ALKPHOS 52  BILITOT 0.8  PROT 7.4  ALBUMIN 3.3*   No results for input(s): LIPASE, AMYLASE in the last 168 hours. No results for input(s): AMMONIA in the last 168 hours. Coagulation Profile: Recent Labs  Lab 08/20/23 1242  INR 1.3*   Cardiac Enzymes: No results for input(s): CKTOTAL, CKMB, CKMBINDEX, TROPONINI in the last 168 hours. BNP (last 3 results) No results for input(s): PROBNP in the last 8760 hours. HbA1C: No results for input(s): HGBA1C in the last 72 hours. CBG: Recent Labs  Lab 08/20/23 1947 08/20/23 2352 08/21/23 0346 08/21/23 0750 08/21/23 1155  GLUCAP 167* 125* 186* 205* 115*   Lipid Profile: No results for input(s): CHOL,  HDL, LDLCALC, TRIG, CHOLHDL, LDLDIRECT in the last 72 hours. Thyroid  Function Tests: No results for input(s): TSH, T4TOTAL, FREET4, T3FREE, THYROIDAB in the last 72 hours. Anemia Panel: No results for input(s): VITAMINB12, FOLATE, FERRITIN, TIBC, IRON, RETICCTPCT in the last 72 hours. Sepsis Labs: No results for input(s): PROCALCITON, LATICACIDVEN in the last 168 hours.  Recent Results (from the past 240 hours)  MRSA Next Gen by PCR, Nasal     Status: None   Collection Time: 08/20/23 12:03 PM   Specimen: Nasal Mucosa; Nasal Swab  Result Value Ref Range Status   MRSA by PCR Next Gen NOT DETECTED NOT DETECTED Final    Comment: (NOTE) The GeneXpert MRSA Assay (FDA approved for NASAL specimens only), is one component of a comprehensive MRSA colonization surveillance program. It is not intended to diagnose MRSA infection nor to guide or monitor treatment for MRSA infections. Test performance is not FDA approved in patients less than 38 years old. Performed at [facility]  Lab, [address], [place], [place] [address]          Radiology Studies: MR BRAIN WO CONTRAST Result Date: 08/20/2023 CLINICAL DATA:  Delirium. EXAM: MRI HEAD WITHOUT CONTRAST TECHNIQUE: Multiplanar, multiecho pulse sequences of the brain and surrounding structures were obtained without intravenous contrast. COMPARISON:  MRI of the brain June 24, 2023. Head CT August 20, 2023. FINDINGS: Brain: No acute infarction, hemorrhage, hydrocephalus, extra-axial collection or mass lesion. Small remote infarcts in the right cerebellar hemisphere and paramedian pons. Scattered and confluent foci of T2 hyperintensity are seen within the white matter of the cerebral hemispheres, similar to prior. Vascular: Normal flow voids. Skull and upper cervical spine: Normal marrow signal. Sinuses/Orbits: Right lens surgery. Trace mucosal thickening of the ethmoid cells. Other: None. IMPRESSION: 1. No acute intracranial abnormality. 2. Small remote infarcts in the right cerebellar hemisphere and paramedian pons. 3. Moderate chronic microvascular ischemic changes of the white matter. Electronically Signed   By: Katyucia  de Macedo Rodrigues M.D.   On: 08/20/2023 13:56   CT HEAD WO CONTRAST Result Date: 08/20/2023 CLINICAL DATA:  Mental status change of unknown cause. Recent history of encephalopathy. EXAM: CT HEAD WITHOUT CONTRAST TECHNIQUE: Contiguous axial images were obtained from the base of the skull through the vertex without intravenous contrast. RADIATION DOSE REDUCTION: This exam was performed according to the departmental dose-optimization program which includes automated exposure control, adjustment of the mA and/or kV according to patient size and/or use of iterative reconstruction technique. COMPARISON:  MRI 06/24/2023 FINDINGS: Brain: Mild age related volume loss. Mild chronic small-vessel ischemic change of the hemispheric white matter. No sign of acute infarction, mass lesion, hemorrhage, hydrocephalus or  extra-axial collection. Some artifactual degradation related to overlying support apparatus. Vascular: There is atherosclerotic calcification of the major vessels at the base of the brain. Skull: Negative Sinuses/Orbits: Clear/normal Other: None IMPRESSION: No acute CT finding. Mild age related volume loss. Mild chronic small-vessel ischemic change of the hemispheric white matter. Electronically Signed   By: Oneil Officer M.D.   On: 08/20/2023 08:21        Scheduled Meds:  amLODipine   10 mg Oral Daily   apixaban   2.5 mg Oral BID   atorvastatin   40 mg Oral Daily   carvedilol   12.5 mg Oral BID WC   Chlorhexidine  Gluconate Cloth  6 each Topical Daily   hydrochlorothiazide   12.5 mg Oral Daily   insulin  aspart  0-15 Units Subcutaneous TID WC   insulin  aspart  0-5 Units  Subcutaneous QHS   insulin  glargine-yfgn  7 Units Subcutaneous Daily   pantoprazole   40 mg Oral Daily   Continuous Infusions:   LOS: 1 day    Time spent: 56 minutes spent on chart review, discussion with nursing staff, consultants, updating family and interview/physical exam; more than 50% of that time was spent in counseling and/or coordination of care.    Camellia PARAS Sakib Noguez, DO [facility] Available via Epic secure chat 7am-7pm After these hours, please refer to coverage provider listed on [email or web address] 08/21/2023, 1:02 PM

## 2023-08-22 DIAGNOSIS — I169 Hypertensive crisis, unspecified: Secondary | ICD-10-CM | POA: Diagnosis not present

## 2023-08-22 LAB — BASIC METABOLIC PANEL
Anion gap: 11 (ref 5–15)
BUN: 21 mg/dL (ref 8–23)
CO2: 20 mmol/L — ABNORMAL LOW (ref 22–32)
Calcium: 8.4 mg/dL — ABNORMAL LOW (ref 8.9–10.3)
Chloride: 106 mmol/L (ref 98–111)
Creatinine, Ser: 1.7 mg/dL — ABNORMAL HIGH (ref 0.44–1.00)
GFR, Estimated: 29 mL/min — ABNORMAL LOW (ref >60)
Glucose, Bld: 155 mg/dL — ABNORMAL HIGH (ref 70–99)
Potassium: 3.7 mmol/L (ref 3.5–5.1)
Sodium: 137 mmol/L (ref 135–145)

## 2023-08-22 LAB — MAGNESIUM: Magnesium: 1.1 mg/dL — ABNORMAL LOW (ref 1.7–2.4)

## 2023-08-22 LAB — GLUCOSE, CAPILLARY: Glucose-Capillary: 117 mg/dL — ABNORMAL HIGH (ref 70–99)

## 2023-08-22 MED ORDER — CARVEDILOL 25 MG PO TABS: 25.0000 mg | ORAL_TABLET | Freq: Two times a day (BID) | ORAL | Status: DC

## 2023-08-22 MED ORDER — AMLODIPINE BESYLATE 10 MG PO TABS: 10.0000 mg | ORAL_TABLET | Freq: Every day | ORAL | 0 refills | Status: AC

## 2023-08-22 MED ORDER — HYDROCHLOROTHIAZIDE 12.5 MG PO TABS: 12.5000 mg | ORAL_TABLET | Freq: Every day | ORAL | 0 refills | Status: DC

## 2023-08-22 MED ORDER — CARVEDILOL 25 MG PO TABS: 25.0000 mg | ORAL_TABLET | Freq: Two times a day (BID) | ORAL | 0 refills | Status: AC

## 2023-08-22 NOTE — Care Management (Addendum)
 Transition of Care [facility]) - Inpatient Brief Assessment   Patient Details  Name: Donna Green MRN: [id number] Date of Birth: [birth year]  Transition of Care [facility] CM/SW Contact:    Corean JAYSON Canary, RN Phone Number: 08/22/2023, 1:53 PM   Clinical Narrative:  Patient discharging today, PT OT assessment reveals recommendation of home health. Was just DC from home health with [facility] on 08/12/2023. Called patient , no answer to discuss. Called daughter, she is picking patient up and will call back to discuss. MD placed [facility] PT and OT orders.  1400 Patient daughter called back to discuss HH, she states they do not need this as of now, however if she does in a few days she was directed to call her PCP to set up services    Transition of Care Asessment: Insurance and Status: Insurance coverage has been reviewed Patient has primary care physician: Yes Home environment has been reviewed: Yes Prior level of function:: Assistance Prior/Current Home Services: No current home services Social Drivers of Health Review: SDOH reviewed no interventions necessary Readmission risk has been reviewed: Yes Transition of care needs: transition of care needs identified, TOC will continue to follow

## 2023-08-22 NOTE — Discharge Summary (Signed)
 Physician Discharge Summary  Donna Green FMW:[id number] DOB: Dec 25, 1940 DOA: 08/20/2023  PCP: Rexanne Ingle, MD  Admit date: 08/20/2023 Discharge date: 08/22/2023  Admitted From: Home Disposition: Home  Recommendations for Outpatient Follow-up:  Follow up with PCP in 1-2 weeks Carvedilol  increased to 25 mg p.o. twice daily, started on amlodipine  10 mg p.o. daily and hydrochlorothiazide  12.5 mg p.o. daily for poorly controlled hypertension Recommend continue to monitor blood pressure closely and bring log at next PCP visit for evaluation as she may need further titration of her antihypertensives Please obtain BMP in one week to ensure renal function and sodium level remains stable  Home Health: No needs identified by therapy while inpatient Equipment/Devices: None  Discharge Condition: Stable CODE STATUS: Full code Diet recommendation: Heart healthy/consistent carbohydrate diet  History of present illness:  Donna Green is a 83 y.o. female with past medical history significant for HTN, DM2, anxiety/depression, HLD, history of CVA, paroxysmal atrial fibrillation on Eliquis , CKD stage IIIb, hard of hearing who presented to [facility] ED on 1/7 from home via EMS with confusion.  Patient was apparently yelling that awoke family members as they noted that she was confused from her typical baseline.  On EMS arrival, patient's blood pressure was noted to be elevated 250/120, heart rate 96, 97% on room air and CBG 172.  Patient was transported to the ED for further evaluation and management.   In the ED, temperature 97.2 F, HR 96, RR 18, BP 231/133, SpO2 100% on room air.  WBC 5.9, hemoglobin 12.9, platelet count 256.  Sodium 141, potassium 3.5, chloride 105, CO2 24, glucose 155, BUN 17, creatinine 1.72.  AST 19, ALT 11, total bilirubin 0.8.  VBG was pH 7.406, pCO2 43.6, pO2 28.  Urinalysis with trace leukocytes, negative nitrite, few bacteria, 11-20 WBCs.  INR 1.3.  CT head without contrast with no  acute intracranial findings, noted mild chronic small vessel ischemic change of the hemispheric white matter.  MRI brain without contrast with no acute intracranial abnormality, small remote infarcts right cerebellar hemisphere and paramedian pons, moderate chronic microvascular ischemic changes of the white matter.  Patient was given IV labetalol  x 2 and started on a Cardene  drip.  PCCM was consulted for further evaluation and management of hypertensive emergency.   Significant hospital events: 1/7: Admit to ICU, Cardene  drip, started on amlodipine /HCTZ 1/8: Transferred to [facility], home carvedilol  restarted 1/9: Carvedilol  increased to 25 mg p.o. twice daily, discharging home    Hospital course:  Hypertensive emergency Patient presenting to the ED with confusion, on EMS arrival patient's blood pressure was noted to be elevated 250/120.  At baseline on carvedilol  12.5 mg p.o. twice daily.  Initially requiring IV labetalol  followed by Cardene  drip which was titrated off.  Patient was started on amlodipine  10 mg p.o. daily, hydrochlorothiazide  12.5 mg p.o. daily and home carvedilol  increased to 25 mg p.o. twice daily.  Recommend outpatient monitoring of blood pressures closely and outpatient follow-up with PCP in 1-2 weeks for further adjustments if needed.  Recommend repeat BMP 1 week to ensure renal function and sodium remained stable.   Hypokalemia Repleted during hospitalization.   Hyperlipidemia Continue atorvastatin  40 mg p.o. daily   Proximal atrial fibrillation on anticoagulation Carvedilol  increased to 25 mg p.o. twice daily as above, continue Eliquis    CKD stage IIIb Cr 1.70 at time of discharge (baseline 1.6 - 2.0), stable   Type 2 diabetes mellitus Continue home Lantus  15u every day, Novolog  8u [facility]   Anxiety/depression  Lorazepam  0.5 mg every 12 hours as needed anxiety    Hard of hearing Supportive care   Weakness/debility/deconditioning: No needs identified by PT/OT while  inpatient.    Discharge Diagnoses:  Principal Problem:   Hypertensive crisis    Discharge Instructions  Discharge Instructions     Call MD for:  difficulty breathing, headache or visual disturbances   Complete by: As directed    Call MD for:  extreme fatigue   Complete by: As directed    Call MD for:  persistant dizziness or light-headedness   Complete by: As directed    Call MD for:  persistant nausea and vomiting   Complete by: As directed    Call MD for:  severe uncontrolled pain   Complete by: As directed    Call MD for:  temperature >100.4   Complete by: As directed    Diet - low sodium heart healthy   Complete by: As directed    Increase activity slowly   Complete by: As directed       Allergies as of 08/22/2023       Reactions   Codeine Other (See Comments)   Paranoid  Hallucinations    Dilaudid  [hydromorphone ] Anxiety   Hallucinations, agitation, paranoia    Morphine Anxiety   Hallucinations, paranoia, agitation   Colcrys [colchicine] Nausea Only   Zyloprim [allopurinol] Nausea Only   Nsaids Other (See Comments)   Stage 3 kidney disease        Medication List     TAKE these medications    acetaminophen  325 MG tablet Commonly known as: TYLENOL  Take 650 mg by mouth 2 (two) times daily as needed for mild pain (pain score 1-3) or moderate pain (pain score 4-6).   amLODipine  10 MG tablet Commonly known as: NORVASC  Take 1 tablet (10 mg total) by mouth daily. Start taking on: August 23, 2023   apixaban  2.5 MG Tabs tablet Commonly known as: ELIQUIS  Take 1 tablet (2.5 mg total) by mouth 2 (two) times daily.   atorvastatin  40 MG tablet Commonly known as: Lipitor Take 1 tablet (40 mg total) by mouth daily.   calcium  carbonate 750 MG chewable tablet Commonly known as: TUMS EX Chew 1 tablet by mouth daily as needed for heartburn.   carvedilol  25 MG tablet Commonly known as: COREG  Take 1 tablet (25 mg total) by mouth 2 (two) times daily with a  meal. What changed:  medication strength how much to take   hydrochlorothiazide  12.5 MG tablet Commonly known as: HYDRODIURIL  Take 1 tablet (12.5 mg total) by mouth daily. Start taking on: August 23, 2023   Lantus  SoloStar 100 UNIT/ML Solostar Pen Generic drug: insulin  glargine Inject 15 Units into the skin at bedtime.   LORazepam  0.5 MG tablet Commonly known as: ATIVAN  Take 1 tablet (0.5 mg total) by mouth every 8 (eight) hours as needed for anxiety. What changed: when to take this   magnesium  oxide 400 MG tablet Commonly known as: MAG-OX Take 400 mg by mouth daily.   NovoLOG  FlexPen 100 UNIT/ML FlexPen Generic drug: insulin  aspart Inject 8 Units into the skin 3 (three) times daily with meals.   polyethylene glycol 17 g packet Commonly known as: MIRALAX  / GLYCOLAX  Take 17 g by mouth daily. What changed:  when to take this reasons to take this        Follow-up Information     Rexanne Ingle, MD. Schedule an appointment as soon as possible for a visit in 1 week(s).  Specialty: Internal Medicine Contact information: [address] [place] [address] [phone]                Allergies  Allergen Reactions   Codeine Other (See Comments)    Paranoid  Hallucinations    Dilaudid  [Hydromorphone ] Anxiety    Hallucinations, agitation, paranoia    Morphine Anxiety    Hallucinations, paranoia, agitation   Colcrys [Colchicine] Nausea Only   Zyloprim [Allopurinol] Nausea Only   Nsaids Other (See Comments)    Stage 3 kidney disease    Consultations: PCCM   Procedures/Studies: MR BRAIN WO CONTRAST Result Date: 08/20/2023 CLINICAL DATA:  Delirium. EXAM: MRI HEAD WITHOUT CONTRAST TECHNIQUE: Multiplanar, multiecho pulse sequences of the brain and surrounding structures were obtained without intravenous contrast. COMPARISON:  MRI of the brain June 24, 2023. Head CT August 20, 2023. FINDINGS: Brain: No acute infarction, hemorrhage,  hydrocephalus, extra-axial collection or mass lesion. Small remote infarcts in the right cerebellar hemisphere and paramedian pons. Scattered and confluent foci of T2 hyperintensity are seen within the white matter of the cerebral hemispheres, similar to prior. Vascular: Normal flow voids. Skull and upper cervical spine: Normal marrow signal. Sinuses/Orbits: Right lens surgery. Trace mucosal thickening of the ethmoid cells. Other: None. IMPRESSION: 1. No acute intracranial abnormality. 2. Small remote infarcts in the right cerebellar hemisphere and paramedian pons. 3. Moderate chronic microvascular ischemic changes of the white matter. Electronically Signed   By: Katyucia  de Macedo Rodrigues M.D.   On: 08/20/2023 13:56   CT HEAD WO CONTRAST Result Date: 08/20/2023 CLINICAL DATA:  Mental status change of unknown cause. Recent history of encephalopathy. EXAM: CT HEAD WITHOUT CONTRAST TECHNIQUE: Contiguous axial images were obtained from the base of the skull through the vertex without intravenous contrast. RADIATION DOSE REDUCTION: This exam was performed according to the departmental dose-optimization program which includes automated exposure control, adjustment of the mA and/or kV according to patient size and/or use of iterative reconstruction technique. COMPARISON:  MRI 06/24/2023 FINDINGS: Brain: Mild age related volume loss. Mild chronic small-vessel ischemic change of the hemispheric white matter. No sign of acute infarction, mass lesion, hemorrhage, hydrocephalus or extra-axial collection. Some artifactual degradation related to overlying support apparatus. Vascular: There is atherosclerotic calcification of the major vessels at the base of the brain. Skull: Negative Sinuses/Orbits: Clear/normal Other: None IMPRESSION: No acute CT finding. Mild age related volume loss. Mild chronic small-vessel ischemic change of the hemispheric white matter. Electronically Signed   By: Oneil Officer M.D.   On: 08/20/2023  08:21     Subjective: Patient seen examined bedside, resting calmly.  Lying in bed.  No specific complaints this morning.  Ready for discharge home.  Discussed with patient's daughter, Donna Green via telephone this morning; regarding new BP medications and increase carvedilol  dose.  Also will need close follow-up with PCP outpatient.  Patient with no other specific questions, concerns or complaints at this time.  Denies headache, no visual changes, no dizziness, no chest pain, no palpitations, no shortness of breath, no abdominal pain, no fever/chills/night sweats, no nausea/vomiting/diarrhea, no focal weakness, no fatigue, no paresthesia.  No acute events overnight per nursing staff.  Discharge Exam: Vitals:   08/22/23 0600 08/22/23 0826  BP: (!) 151/82 (!) 189/88  Pulse:  100  Resp:  14  Temp:  98.6 F (37 C)  SpO2:  98%   Vitals:   08/21/23 2157 08/22/23 0527 08/22/23 0600 08/22/23 0826  BP: (!) 146/80 (!) 172/78 (!) 151/82 ROLLEN)  189/88  Pulse: 86 90  100  Resp: 18 19  14   Temp: 98.2 F (36.8 C) 98.6 F (37 C)  98.6 F (37 C)  TempSrc: Oral Oral  Oral  SpO2: 97% 95%  98%  Weight:      Height:        Physical Exam: GEN: NAD, alert and oriented x 3, elderly in appearance, hard of hearing HEENT: NCAT, PERRL, EOMI, sclera clear, MMM PULM: CTAB w/o wheezes/crackles, normal respiratory effort, on room air with SpO2 99% at rest CV: RRR w/o M/G/R GI: abd soft, NTND, NABS, no R/G/M MSK: no peripheral edema, muscle strength globally intact 5/5 bilateral upper/lower extremities NEURO: CN II-XII intact, no focal deficits, sensation to light touch intact PSYCH: normal mood/affect Integumentary: dry/intact, no rashes or wounds    The results of significant diagnostics from this hospitalization (including imaging, microbiology, ancillary and laboratory) are listed below for reference.     Microbiology: Recent Results (from the past 240 hours)  MRSA Next Gen by PCR, Nasal     Status:  None   Collection Time: 08/20/23 12:03 PM   Specimen: Nasal Mucosa; Nasal Swab  Result Value Ref Range Status   MRSA by PCR Next Gen NOT DETECTED NOT DETECTED Final    Comment: (NOTE) The GeneXpert MRSA Assay (FDA approved for NASAL specimens only), is one component of a comprehensive MRSA colonization surveillance program. It is not intended to diagnose MRSA infection nor to guide or monitor treatment for MRSA infections. Test performance is not FDA approved in patients less than 31 years old. Performed at [facility], [address] [place], [place] [address]      Labs: BNP (last 3 results) No results for input(s): BNP in the last 8760 hours. Basic Metabolic Panel: Recent Labs  Lab 08/20/23 0645 08/20/23 0652 08/20/23 0653 08/21/23 0552 08/22/23 0524  NA 141 143 143 138 137  K 3.5 3.6 3.6 3.3* 3.7  CL 105  --  106 104 106  CO2 24  --   --  22 20*  GLUCOSE 155*  --  155* 183* 155*  BUN 17  --  21 20 21   CREATININE 1.72*  --  1.70* 1.73* 1.70*  CALCIUM  8.9  --   --  8.4* 8.4*  MG 1.1*  --   --   --  1.1*   Liver Function Tests: Recent Labs  Lab 08/20/23 0645  AST 19  ALT 11  ALKPHOS 52  BILITOT 0.8  PROT 7.4  ALBUMIN 3.3*   No results for input(s): LIPASE, AMYLASE in the last 168 hours. No results for input(s): AMMONIA in the last 168 hours. CBC: Recent Labs  Lab 08/20/23 0645 08/20/23 0652 08/20/23 0653 08/21/23 0552  WBC 5.9  --   --  5.1  NEUTROABS 2.6  --   --   --   HGB 12.9 13.9 14.3 11.7*  HCT 40.7 41.0 42.0 36.6  MCV 99.8  --   --  96.6  PLT 256  --   --  234   Cardiac Enzymes: No results for input(s): CKTOTAL, CKMB, CKMBINDEX, TROPONINI in the last 168 hours. BNP: Invalid input(s): POCBNP CBG: Recent Labs  Lab 08/21/23 0750 08/21/23 1155 08/21/23 1600 08/21/23 2037 08/22/23 0607  GLUCAP 205* 115* 152* 132* 117*   D-Dimer No results for input(s): DDIMER in the last 72 hours. Hgb A1c No results for  input(s): HGBA1C in the last 72 hours. Lipid Profile No results for input(s):  CHOL, HDL, LDLCALC, TRIG, CHOLHDL, LDLDIRECT in the last 72 hours. Thyroid  function studies No results for input(s): TSH, T4TOTAL, T3FREE, THYROIDAB in the last 72 hours.  Invalid input(s): FREET3 Anemia work up No results for input(s): VITAMINB12, FOLATE, FERRITIN, TIBC, IRON, RETICCTPCT in the last 72 hours. Urinalysis    Component Value Date/Time   COLORURINE STRAW (A) 08/20/2023 0625   APPEARANCEUR HAZY (A) 08/20/2023 0625   LABSPEC 1.011 08/20/2023 0625   PHURINE 7.0 08/20/2023 0625   GLUCOSEU NEGATIVE 08/20/2023 0625   HGBUR NEGATIVE 08/20/2023 0625   BILIRUBINUR NEGATIVE 08/20/2023 0625   KETONESUR NEGATIVE 08/20/2023 0625   PROTEINUR >=300 (A) 08/20/2023 0625   UROBILINOGEN 1.0 04/30/2010 1504   NITRITE NEGATIVE 08/20/2023 0625   LEUKOCYTESUR TRACE (A) 08/20/2023 0625   Sepsis Labs Recent Labs  Lab 08/20/23 0645 08/21/23 0552  WBC 5.9 5.1   Microbiology Recent Results (from the past 240 hours)  MRSA Next Gen by PCR, Nasal     Status: None   Collection Time: 08/20/23 12:03 PM   Specimen: Nasal Mucosa; Nasal Swab  Result Value Ref Range Status   MRSA by PCR Next Gen NOT DETECTED NOT DETECTED Final    Comment: (NOTE) The GeneXpert MRSA Assay (FDA approved for NASAL specimens only), is one component of a comprehensive MRSA colonization surveillance program. It is not intended to diagnose MRSA infection nor to guide or monitor treatment for MRSA infections. Test performance is not FDA approved in patients less than 36 years old. Performed at [facility], [address], [place], [place] [address]      Time coordinating discharge: Over 30 minutes  SIGNED:   Camellia PARAS Dary Dilauro, DO  [facility] 08/22/2023, 10:18 AM

## 2023-08-22 NOTE — Progress Notes (Signed)
 PIV removed. AVS reviewed with patient and daughter at bedside. Both parties verbalize understanding of medication regimen and necessary follow up appointments.

## 2023-08-22 NOTE — Evaluation (Signed)
 Occupational Therapy Evaluation Patient Details Name: Donna Green MRN: [id number] DOB: [birth year] Today's Date: 08/22/2023   History of Present Illness Pt is 83 yo female who presents on 08/20/23 with AMS, admitted with hypertensive emergency.  MRI negative for acute changes, though history of R frontal and L frontoparietal infarcts in 11/24.  PMH: DM, HLD, HTN, CKD, HOH, PAF on Eliquis , large HH, GAD and CVA x2 with residual R hemiparesis.   Clinical Impression   PTA patient reports living with her daughter, using RW for mobility and needing some assist for ADLs as needed.  Questionable historian at times due to fluctuating orientation to place.  Patient currently requires min assist for transfers and mobility using RW,  min to mod assist for ADLs.  She requires cueing for safety, problem solving and awareness throughout session.  Per RN pt has good support at home.  Based on performance today, recommend 24/7 assist with hands on support with transfers and ADLs; full assist with IADLs (meds, meals).  Recommend continued OT acutely and after dc at [facility] level.  Will follow.       If plan is discharge home, recommend the following: A little help with walking and/or transfers;A lot of help with bathing/dressing/bathroom;Assistance with cooking/housework;Direct supervision/assist for medications management;Direct supervision/assist for financial management;Assist for transportation;Help with stairs or ramp for entrance    Functional Status Assessment  Patient has had a recent decline in their functional status and demonstrates the ability to make significant improvements in function in a reasonable and predictable amount of time.  Equipment Recommendations  None recommended by OT    Recommendations for Other Services       Precautions / Restrictions Precautions Precautions: Fall Restrictions Weight Bearing Restrictions Per Provider Order: No      Mobility Bed Mobility Overal bed  mobility: Needs Assistance Bed Mobility: Supine to Sit, Sit to Supine     Supine to sit: Contact guard Sit to supine: Contact guard assist        Transfers Overall transfer level: Needs assistance Equipment used: Rolling walker (2 wheels) Transfers: Sit to/from Stand Sit to Stand: Min assist           General transfer comment: posterior bias with initial sit to stand needing help for anterior weight shift      Balance Overall balance assessment: Needs assistance Sitting-balance support: Feet supported Sitting balance-Leahy Scale: Fair     Standing balance support: Bilateral upper extremity supported, Single extremity supported, During functional activity, Reliant on assistive device for balance Standing balance-Leahy Scale: Poor Standing balance comment: relies on BUE and external support                           ADL either performed or assessed with clinical judgement   ADL Overall ADL's : Needs assistance/impaired     Grooming: Minimal assistance;Standing           Upper Body Dressing : Sitting;Minimal assistance   Lower Body Dressing: Sit to/from stand;Minimal assistance   Toilet Transfer: Minimal assistance;Ambulation;Rolling walker (2 wheels);Regular Toilet           Functional mobility during ADLs: Minimal assistance;Cueing for safety;Rolling walker (2 wheels)       Vision   Vision Assessment?: No apparent visual deficits     Perception         Praxis         Pertinent Vitals/Pain Pain Assessment Pain Assessment: No/denies pain  Extremity/Trunk Assessment Upper Extremity Assessment Upper Extremity Assessment: Generalized weakness   Lower Extremity Assessment Lower Extremity Assessment: Defer to PT evaluation   Cervical / Trunk Assessment Cervical / Trunk Assessment: Kyphotic   Communication Communication Communication: Hearing impairment   Cognition Arousal: Alert Behavior During Therapy: WFL for tasks  assessed/performed Overall Cognitive Status: Impaired/Different from baseline Area of Impairment: Orientation, Attention, Memory, Following commands, Safety/judgement, Awareness, Problem solving                 Orientation Level: Disoriented to, Place, Situation (flucutates during session) Current Attention Level: Sustained Memory: Decreased recall of precautions, Decreased short-term memory Following Commands: Follows one step commands consistently, Follows one step commands with increased time, Follows multi-step commands inconsistently Safety/Judgement: Decreased awareness of safety, Decreased awareness of deficits Awareness: Intellectual Problem Solving: Slow processing, Difficulty sequencing, Requires verbal cues, Decreased initiation General Comments: pt oriented with moments of thinking she is a home 2x during session, follows simple commands but demonstrates poor awareness of deficits and safety.  Pt asking OT to leave RW close so she can get up when needed, but also neeeded assist to avoid a fall in the bathroom which as was aware of.     General Comments       Exercises     Shoulder Instructions      Home Living Family/patient expects to be discharged to:: Private residence Living Arrangements: Children Available Help at Discharge: Family;Available 24 hours/day Type of Home: House Home Access: Stairs to enter [employer] of Steps: 2 Entrance Stairs-Rails: Right Home Layout: Multi-level Alternate Level Stairs-Number of Steps: 4 Alternate Level Stairs-Rails: Right Bathroom Shower/Tub: Walk-in shower;Tub/shower unit   Bathroom Toilet: Standard     Home Equipment: [employer] (2 wheels);BSC/3in1;Hand held shower head   Additional Comments: Uses BSC in tub/shower(in daughter's bathroom). Pt has walk in shower in her bathroom      Prior Functioning/Environment Prior Level of Function : Needs assist             Mobility Comments: uses walker,  just finished 6 weeks of PT ADLs Comments: help for some ADL's- questionable historian, reports she manages her meds        OT Problem List: Decreased strength;Decreased activity tolerance;Impaired balance (sitting and/or standing);Pain;Decreased cognition;Decreased safety awareness;Decreased knowledge of use of DME or AE;Decreased knowledge of precautions;Obesity      OT Treatment/Interventions: Self-care/ADL training;Energy conservation;DME and/or AE instruction;Therapeutic exercise;Therapeutic activities;Balance training;Patient/family education    OT Goals(Current goals can be found in the care plan section) Acute Rehab OT Goals Patient Stated Goal: none stated OT Goal Formulation: Patient unable to participate in goal setting Time For Goal Achievement: 09/05/23 Potential to Achieve Goals: Good  OT Frequency: Min 1X/week    Co-evaluation              AM-PAC OT 6 Clicks Daily Activity     Outcome Measure Help from another person eating meals?: A Little Help from another person taking care of personal grooming?: A Little Help from another person toileting, which includes using toliet, bedpan, or urinal?: A Lot Help from another person bathing (including washing, rinsing, drying)?: A Little Help from another person to put on and taking off regular upper body clothing?: A Little Help from another person to put on and taking off regular lower body clothing?: A Little 6 Click Score: 17   End of Session Equipment Utilized During Treatment: Gait belt;Rolling walker (2 wheels) Nurse Communication: Mobility status  Activity Tolerance: Patient tolerated treatment well  Patient left: in bed;with call bell/phone within reach;with bed alarm set;with nursing/sitter in room  OT Visit Diagnosis: Other abnormalities of gait and mobility (R26.89);Muscle weakness (generalized) (M62.81);Other symptoms and signs involving cognitive function                Time: 1040-1106 OT Time  Calculation (min): 26 min Charges:  OT General Charges $OT Visit: 1 Visit OT Evaluation $OT Eval Moderate Complexity: 1 Mod OT Treatments $Self Care/Home Management : 8-22 mins  Etta NOVAK, OT Acute Rehabilitation Services Office [phone]   Etta GORMAN Hope 08/22/2023, 11:30 AM

## 2023-08-29 DIAGNOSIS — I4891 Unspecified atrial fibrillation: Secondary | ICD-10-CM | POA: Diagnosis not present

## 2023-08-29 DIAGNOSIS — N1832 Chronic kidney disease, stage 3b: Secondary | ICD-10-CM | POA: Diagnosis not present

## 2023-08-29 DIAGNOSIS — R413 Other amnesia: Secondary | ICD-10-CM | POA: Diagnosis not present

## 2023-08-29 DIAGNOSIS — I639 Cerebral infarction, unspecified: Secondary | ICD-10-CM | POA: Diagnosis not present

## 2023-08-29 DIAGNOSIS — I674 Hypertensive encephalopathy: Secondary | ICD-10-CM | POA: Diagnosis not present

## 2023-08-29 DIAGNOSIS — E1122 Type 2 diabetes mellitus with diabetic chronic kidney disease: Secondary | ICD-10-CM | POA: Diagnosis not present

## 2023-08-29 DIAGNOSIS — R443 Hallucinations, unspecified: Secondary | ICD-10-CM | POA: Diagnosis not present

## 2023-10-15 DIAGNOSIS — R059 Cough, unspecified: Secondary | ICD-10-CM | POA: Diagnosis not present

## 2023-10-15 DIAGNOSIS — N1832 Chronic kidney disease, stage 3b: Secondary | ICD-10-CM | POA: Diagnosis not present

## 2023-10-15 DIAGNOSIS — E1122 Type 2 diabetes mellitus with diabetic chronic kidney disease: Secondary | ICD-10-CM | POA: Diagnosis not present

## 2023-10-15 DIAGNOSIS — R600 Localized edema: Secondary | ICD-10-CM | POA: Diagnosis not present

## 2024-01-15 DIAGNOSIS — I4891 Unspecified atrial fibrillation: Secondary | ICD-10-CM | POA: Diagnosis not present

## 2024-01-15 DIAGNOSIS — N1832 Chronic kidney disease, stage 3b: Secondary | ICD-10-CM | POA: Diagnosis not present

## 2024-01-15 DIAGNOSIS — M25559 Pain in unspecified hip: Secondary | ICD-10-CM | POA: Diagnosis not present

## 2024-01-15 DIAGNOSIS — I10 Essential (primary) hypertension: Secondary | ICD-10-CM | POA: Diagnosis not present

## 2024-01-15 DIAGNOSIS — E1122 Type 2 diabetes mellitus with diabetic chronic kidney disease: Secondary | ICD-10-CM | POA: Diagnosis not present

## 2024-01-15 DIAGNOSIS — I639 Cerebral infarction, unspecified: Secondary | ICD-10-CM | POA: Diagnosis not present

## 2024-01-15 DIAGNOSIS — R300 Dysuria: Secondary | ICD-10-CM | POA: Diagnosis not present

## 2024-01-15 DIAGNOSIS — R413 Other amnesia: Secondary | ICD-10-CM | POA: Diagnosis not present

## 2024-01-16 DIAGNOSIS — R300 Dysuria: Secondary | ICD-10-CM | POA: Diagnosis not present

## 2024-01-17 DIAGNOSIS — M25559 Pain in unspecified hip: Secondary | ICD-10-CM | POA: Diagnosis not present

## 2024-03-10 DIAGNOSIS — Z0000 Encounter for general adult medical examination without abnormal findings: Secondary | ICD-10-CM | POA: Diagnosis not present

## 2024-03-10 DIAGNOSIS — Z23 Encounter for immunization: Secondary | ICD-10-CM | POA: Diagnosis not present

## 2024-03-10 DIAGNOSIS — Z1331 Encounter for screening for depression: Secondary | ICD-10-CM | POA: Diagnosis not present

## 2024-03-10 DIAGNOSIS — I480 Paroxysmal atrial fibrillation: Secondary | ICD-10-CM | POA: Diagnosis not present

## 2024-03-10 DIAGNOSIS — I700 Atherosclerosis of aorta: Secondary | ICD-10-CM | POA: Diagnosis not present

## 2024-03-10 DIAGNOSIS — E7800 Pure hypercholesterolemia, unspecified: Secondary | ICD-10-CM | POA: Diagnosis not present

## 2024-03-10 DIAGNOSIS — I10 Essential (primary) hypertension: Secondary | ICD-10-CM | POA: Diagnosis not present

## 2024-03-10 DIAGNOSIS — M810 Age-related osteoporosis without current pathological fracture: Secondary | ICD-10-CM | POA: Diagnosis not present

## 2024-03-10 DIAGNOSIS — E1140 Type 2 diabetes mellitus with diabetic neuropathy, unspecified: Secondary | ICD-10-CM | POA: Diagnosis not present

## 2024-03-10 DIAGNOSIS — F411 Generalized anxiety disorder: Secondary | ICD-10-CM | POA: Diagnosis not present

## 2024-03-10 DIAGNOSIS — N1832 Chronic kidney disease, stage 3b: Secondary | ICD-10-CM | POA: Diagnosis not present

## 2024-03-12 ENCOUNTER — Telehealth: Payer: Self-pay | Admitting: Hematology

## 2024-03-23 ENCOUNTER — Other Ambulatory Visit: Payer: Self-pay

## 2024-03-23 DIAGNOSIS — D472 Monoclonal gammopathy: Secondary | ICD-10-CM

## 2024-03-24 ENCOUNTER — Inpatient Hospital Stay: Attending: Hematology

## 2024-03-24 ENCOUNTER — Inpatient Hospital Stay: Admitting: Hematology

## 2024-03-24 VITALS — BP 178/74 | HR 88 | Temp 97.5°F | Resp 18 | Wt 152.0 lb

## 2024-03-24 DIAGNOSIS — N1832 Chronic kidney disease, stage 3b: Secondary | ICD-10-CM | POA: Diagnosis not present

## 2024-03-24 DIAGNOSIS — D472 Monoclonal gammopathy: Secondary | ICD-10-CM

## 2024-03-24 DIAGNOSIS — I129 Hypertensive chronic kidney disease with stage 1 through stage 4 chronic kidney disease, or unspecified chronic kidney disease: Secondary | ICD-10-CM | POA: Diagnosis not present

## 2024-03-24 DIAGNOSIS — E1122 Type 2 diabetes mellitus with diabetic chronic kidney disease: Secondary | ICD-10-CM | POA: Diagnosis not present

## 2024-03-24 DIAGNOSIS — E7800 Pure hypercholesterolemia, unspecified: Secondary | ICD-10-CM | POA: Insufficient documentation

## 2024-03-24 LAB — CBC WITH DIFFERENTIAL (CANCER CENTER ONLY)
Abs Immature Granulocytes: 0.01 K/uL (ref 0.00–0.07)
Basophils Absolute: 0 K/uL (ref 0.0–0.1)
Basophils Relative: 1 %
Eosinophils Absolute: 0.2 K/uL (ref 0.0–0.5)
Eosinophils Relative: 3 %
HCT: 36.4 % (ref 36.0–46.0)
Hemoglobin: 12.1 g/dL (ref 12.0–15.0)
Immature Granulocytes: 0 %
Lymphocytes Relative: 42 %
Lymphs Abs: 2.5 K/uL (ref 0.7–4.0)
MCH: 33.1 pg (ref 26.0–34.0)
MCHC: 33.2 g/dL (ref 30.0–36.0)
MCV: 99.5 fL (ref 80.0–100.0)
Monocytes Absolute: 0.5 K/uL (ref 0.1–1.0)
Monocytes Relative: 8 %
Neutro Abs: 2.7 K/uL (ref 1.7–7.7)
Neutrophils Relative %: 46 %
Platelet Count: 263 K/uL (ref 150–400)
RBC: 3.66 MIL/uL — ABNORMAL LOW (ref 3.87–5.11)
RDW: 15 % (ref 11.5–15.5)
WBC Count: 6 K/uL (ref 4.0–10.5)
nRBC: 0 % (ref 0.0–0.2)

## 2024-03-24 LAB — CMP (CANCER CENTER ONLY)
ALT: 21 U/L (ref 0–44)
AST: 28 U/L (ref 15–41)
Albumin: 4.2 g/dL (ref 3.5–5.0)
Alkaline Phosphatase: 55 U/L (ref 38–126)
Anion gap: 7 (ref 5–15)
BUN: 30 mg/dL — ABNORMAL HIGH (ref 8–23)
CO2: 26 mmol/L (ref 22–32)
Calcium: 9.4 mg/dL (ref 8.9–10.3)
Chloride: 110 mmol/L (ref 98–111)
Creatinine: 1.62 mg/dL — ABNORMAL HIGH (ref 0.44–1.00)
GFR, Estimated: 31 mL/min — ABNORMAL LOW (ref >60)
Glucose, Bld: 114 mg/dL — ABNORMAL HIGH (ref 70–99)
Potassium: 3.5 mmol/L (ref 3.5–5.1)
Sodium: 143 mmol/L (ref 135–145)
Total Bilirubin: 0.5 mg/dL (ref 0.0–1.2)
Total Protein: 7.8 g/dL (ref 6.5–8.1)

## 2024-03-24 LAB — VITAMIN B12: Vitamin B-12: 414 pg/mL (ref 180–914)

## 2024-03-25 LAB — KAPPA/LAMBDA LIGHT CHAINS
Kappa free light chain: 57 mg/L — ABNORMAL HIGH (ref 3.3–19.4)
Kappa, lambda light chain ratio: 1.78 — ABNORMAL HIGH (ref 0.26–1.65)
Lambda free light chains: 32 mg/L — ABNORMAL HIGH (ref 5.7–26.3)

## 2024-03-26 LAB — MULTIPLE MYELOMA PANEL, SERUM
Albumin SerPl Elph-Mcnc: 3.5 g/dL (ref 2.9–4.4)
Albumin/Glob SerPl: 1 (ref 0.7–1.7)
Alpha 1: 0.3 g/dL (ref 0.0–0.4)
Alpha2 Glob SerPl Elph-Mcnc: 0.8 g/dL (ref 0.4–1.0)
B-Globulin SerPl Elph-Mcnc: 1.2 g/dL (ref 0.7–1.3)
Gamma Glob SerPl Elph-Mcnc: 1.5 g/dL (ref 0.4–1.8)
Globulin, Total: 3.7 g/dL (ref 2.2–3.9)
IgA: 429 mg/dL — ABNORMAL HIGH (ref 64–422)
IgG (Immunoglobin G), Serum: 1557 mg/dL (ref 586–1602)
IgM (Immunoglobulin M), Srm: 99 mg/dL (ref 26–217)
Total Protein ELP: 7.2 g/dL (ref 6.0–8.5)

## 2024-04-01 NOTE — Progress Notes (Signed)
 HEMATOLOGY/ONCOLOGY CLINIC NOTE  Date of Service: .03/24/2024   Patient Care Team: Rexanne Ingle, MD as PCP - General (Internal Medicine) Santo Stanly LABOR, MD as PCP - Cardiology (Cardiology) Perri Starleen BROCKS, MD as Referring Physician (Nephrology)  CHIEF COMPLAINTS/PURPOSE OF CONSULTATION:  Evaluation and management of monoclonal gammopathy of unknown significance  HISTORY OF PRESENTING ILLNESS:  Donna Green is a wonderful 83 y.o. female who has been referred to us  by Ingle Rexanne, MD for evaluation and management of monoclonal gammopathy of unknown significance. She was last seen by Dr. Amadeo in 2015.  Today, she is accompanied by her daughter. She reports that she has not had any new bone pain, loss of appetite, leg swelling, abdominal pain, sleep disturbances. She does have some middle back pain, which is unchanged from baseline.  She complains that her hearing loss has worsened and she is planning on getting hearing aids. She also complains of neopathy/pain in her bilateral legs, but denies any major changes in last 6 months-1 year.   She denies any recent falls and sometimes uses a cane. She denies any recent major medication changes or unexpected weight loss. She reports that she previously could not tolerate Lyrica  as it caused her to stumble. She did have bone density testing a few years ago.  INTERVAL HISTORY: Donna Green is a wonderful 83 y.o. female here for continued evaluation and management of her MGUS. Patient was last seen by us  in March 2024. She notes no acute new focal bone pains no fevers no chills no night sweats no unexpected weight loss. No new fatigue. Overall has been feeling well.  MEDICAL HISTORY:  Past Medical History:  Diagnosis Date   Anxiety 03/23/2022   Chronic kidney disease    Diabetes mellitus without complication (HCC)    Headache    Hyperlipidemia    Hypertension    Right hemiparesis (HCC)    Small bowel obstruction  (HCC)    Stroke (HCC)    x2    SURGICAL HISTORY: Past Surgical History:  Procedure Laterality Date   BLADDER SURGERY     BOWEL RESECTION     EYE SURGERY     TUBAL LIGATION      SOCIAL HISTORY: Social History   Socioeconomic History   Marital status: Widowed    Spouse name: Not on file   Number of children: 2   Years of education: Not on file   Highest education level: Not on file  Occupational History   Occupation: retired   Tobacco Use   Smoking status: Never   Smokeless tobacco: Never  Vaping Use   Vaping status: Never Used  Substance and Sexual Activity   Alcohol use: No   Drug use: No   Sexual activity: Not on file  Other Topics Concern   Not on file  Social History Narrative   Live with Daughter       1- 2 times a week    Social Drivers of Corporate investment banker Strain: Not on file  Food Insecurity: No Food Insecurity (08/20/2023)   Hunger Vital Sign    Worried About Running Out of Food in the Last Year: Never true    Ran Out of Food in the Last Year: Never true  Transportation Needs: No Transportation Needs (08/20/2023)   PRAPARE - Administrator, Civil Service (Medical): No    Lack of Transportation (Non-Medical): No  Physical Activity: Not on file  Stress: Not on file  Social Connections: Unknown (08/21/2023)   Social Connection and Isolation Panel    Frequency of Communication with Friends and Family: Never    Frequency of Social Gatherings with Friends and Family: More than three times a week    Attends Religious Services: More than 4 times per year    Active Member of [employer] or Organizations: No    Attends Banker Meetings: 1 to 4 times per year    Marital Status: Patient declined  Intimate Partner Violence: Not At Risk (08/20/2023)   Humiliation, Afraid, Rape, and Kick questionnaire    Fear of Current or Ex-Partner: No    Emotionally Abused: No    Physically Abused: No    Sexually Abused: No    FAMILY  HISTORY: Family History  Problem Relation Age of Onset   Diabetes Mother    Heart disease Mother    Parkinsonism Father    Diabetes Sister    Diabetes Brother    Cancer Brother    Diabetes Maternal Grandmother     ALLERGIES:  is allergic to codeine, dilaudid  [hydromorphone ], morphine, colcrys [colchicine], zyloprim [allopurinol], and nsaids.  MEDICATIONS:  Current Outpatient Medications  Medication Sig Dispense Refill   acetaminophen  (TYLENOL ) 325 MG tablet Take 650 mg by mouth 2 (two) times daily as needed for mild pain (pain score 1-3) or moderate pain (pain score 4-6).     amLODipine  (NORVASC ) 10 MG tablet Take 1 tablet (10 mg total) by mouth daily. 90 tablet 0   apixaban  (ELIQUIS ) 2.5 MG TABS tablet Take 1 tablet (2.5 mg total) by mouth 2 (two) times daily. 60 tablet 2   atorvastatin  (LIPITOR) 40 MG tablet Take 1 tablet (40 mg total) by mouth daily. 30 tablet 1   calcium  carbonate (TUMS EX) 750 MG chewable tablet Chew 1 tablet by mouth daily as needed for heartburn.     carvedilol  (COREG ) 25 MG tablet Take 1 tablet (25 mg total) by mouth 2 (two) times daily with a meal. 180 tablet 0   insulin  aspart (NOVOLOG  FLEXPEN) 100 UNIT/ML FlexPen Inject 8 Units into the skin 3 (three) times daily with meals.     LANTUS  SOLOSTAR 100 UNIT/ML Solostar Pen Inject 15 Units into the skin at bedtime.     LORazepam  (ATIVAN ) 0.5 MG tablet Take 1 tablet (0.5 mg total) by mouth every 8 (eight) hours as needed for anxiety. (Patient taking differently: Take 0.5 mg by mouth in the morning and at bedtime.) 30 tablet 0   magnesium  oxide (MAG-OX) 400 MG tablet Take 400 mg by mouth daily.     polyethylene glycol (MIRALAX  / GLYCOLAX ) 17 g packet Take 17 g by mouth daily. (Patient taking differently: Take 17 g by mouth daily as needed for mild constipation or moderate constipation.) 14 each 0   No current facility-administered medications for this visit.    REVIEW OF SYSTEMS:    .10 Point review of Systems  was done is negative except as noted above.  PHYSICAL EXAMINATION: ECOG PERFORMANCE STATUS: 1 - Symptomatic but completely ambulatory  . Vitals:   03/24/24 1308 03/24/24 1318  BP: (!) 191/84 (!) 178/74  Pulse: 88   Resp: 18   Temp: (!) 97.5 F (36.4 C)   SpO2: 98%   . GENERAL:alert, in no acute distress and comfortable SKIN: no acute rashes, no significant lesions EYES: conjunctiva are pink and non-injected, sclera anicteric OROPHARYNX: MMM, no exudates, no oropharyngeal erythema or ulceration NECK: supple, no JVD LYMPH:  no palpable lymphadenopathy in  the cervical, axillary or inguinal regions LUNGS: clear to auscultation b/l with normal respiratory effort HEART: regular rate & rhythm ABDOMEN:  normoactive bowel sounds , non tender, not distended. Extremity: no pedal edema PSYCH: alert & oriented x 3 with fluent speech NEURO: no focal motor/sensory deficits   LABORATORY DATA:  I have reviewed the data as listed .    Latest Ref Rng & Units 03/24/2024   12:35 PM 08/21/2023    5:52 AM 08/20/2023    6:53 AM  CBC  WBC 4.0 - 10.5 K/uL 6.0  5.1    Hemoglobin 12.0 - 15.0 g/dL 87.8  88.2  85.6   Hematocrit 36.0 - 46.0 % 36.4  36.6  42.0   Platelets 150 - 400 K/uL 263  234     .    Latest Ref Rng & Units 03/24/2024   12:35 PM 08/22/2023    5:24 AM 08/21/2023    5:52 AM  CMP  Glucose 70 - 99 mg/dL 885  844  816   BUN 8 - 23 mg/dL 30  21  20    Creatinine 0.44 - 1.00 mg/dL 8.37  8.29  8.26   Sodium 135 - 145 mmol/L 143  137  138   Potassium 3.5 - 5.1 mmol/L 3.5  3.7  3.3   Chloride 98 - 111 mmol/L 110  106  104   CO2 22 - 32 mmol/L 26  20  22    Calcium  8.9 - 10.3 mg/dL 9.4  8.4  8.4   Total Protein 6.5 - 8.1 g/dL 7.8     Total Bilirubin 0.0 - 1.2 mg/dL 0.5     Alkaline Phos 38 - 126 U/L 55     AST 15 - 41 U/L 28     ALT 0 - 44 U/L 21      Component     Latest Ref Rng 10/13/2022  IgG (Immunoglobin G), Serum     586 - 1,602 mg/dL 8,712   IgA     64 - 577 mg/dL 569 (H)    IgM (Immunoglobulin M), Srm     26 - 217 mg/dL 898   Total Protein ELP     6.0 - 8.5 g/dL 7.2 (C)  Albumin SerPl Elph-Mcnc     2.9 - 4.4 g/dL 4.0 (C)  Alpha 1     0.0 - 0.4 g/dL 0.2 (C)  Alpha2 Glob SerPl Elph-Mcnc     0.4 - 1.0 g/dL 0.6 (C)  B-Globulin SerPl Elph-Mcnc     0.7 - 1.3 g/dL 1.1 (C)  Gamma Glob SerPl Elph-Mcnc     0.4 - 1.8 g/dL 1.3 (C)  M Protein SerPl Elph-Mcnc     Not Observed g/dL Not Observed (C)  Globulin, Total     2.2 - 3.9 g/dL 3.2 (C)  Albumin/Glob SerPl     0.7 - 1.7  1.3 (C)  IFE 1 Comment ! (C) polyclonal increase in immunoglobulins  Please Note (HCV): Comment (C)  Kappa free light chain     3.3 - 19.4 mg/L 56.3 (H)   Lambda free light chains     5.7 - 26.3 mg/L 21.7   Kappa, lambda light chain ratio     0.26 - 1.65  2.59 (H)   Vitamin B12     180 - 914 pg/mL 309     Legend: (H) High ! Abnormal (C) Corrected RADIOGRAPHIC STUDIES: I have personally reviewed the radiological images as listed and agreed with the findings in the report.  No results found.  ASSESSMENT & PLAN:   Wonderful 83 y/o female with:  1. MGUS (monoclonal gammopathy of unknown significance 2. Stage 3b chronic kidney diseasee 3. Diabetic retinopathy 4. Secondary hyperparathyroidism of renal origin 5. age-related osteoporosis, unspecified pathological fracture presence 6. Essential (primary) hypertension 7. Anxiety 8. Pure hypercholesterolemia 9. Type 2 diabetes mellitus with diabetic chronic kidney disease 9. Type 2 diabetes mellitus with diabetic neuropathy, unspecified)  PLAN: - Patient is here for delayed follow-up of her MGUS. No focal bone pains fevers chills night sweats or unexpected weight loss. Labs done today 03/24/2024 were discussed with her in details CBC shows normal hemoglobin of 12.1 with normal WBC count and platelets CMP stable with chronic kidney disease creatinine of 1.62.  Patient appears dehydrated and was recommended to drink more water B12  within normal limits at 414 Myeloma panel shows stable IgA level which is elevated at 429 with no measurable M spike and IFE showing polyclonal increase in 1 or more immunoglobulins. Serum kappa lambda free light chains are stable with a near normal kappa lambda ratio of 1.78. No lab or clinical evidence of progression to active myeloma.  FOLLOW-UP:  Phone visit with Dr Onesimo in 50 weeks Labs in 48 weeks   The total time spent in the appointment was 25 minutes*.  All of the patient's questions were answered with apparent satisfaction. The patient knows to call the clinic with any problems, questions or concerns.   Emaline Onesimo MD MS AAHIVMS [facility] Hematology/Oncology Physician [facility]  .*Total Encounter Time as defined by the Centers for Medicare and Medicaid Services includes, in addition to the face-to-face time of a patient visit (documented in the note above) non-face-to-face time: obtaining and reviewing outside history, ordering and reviewing medications, tests or procedures, care coordination (communications with other health care professionals or caregivers) and documentation in the medical record.

## 2024-04-06 ENCOUNTER — Encounter: Payer: Self-pay | Admitting: Sports Medicine

## 2024-04-06 ENCOUNTER — Ambulatory Visit: Admitting: Sports Medicine

## 2024-04-06 ENCOUNTER — Other Ambulatory Visit (INDEPENDENT_AMBULATORY_CARE_PROVIDER_SITE_OTHER): Payer: Self-pay

## 2024-04-06 DIAGNOSIS — M25552 Pain in left hip: Secondary | ICD-10-CM | POA: Diagnosis not present

## 2024-04-06 DIAGNOSIS — M1612 Unilateral primary osteoarthritis, left hip: Secondary | ICD-10-CM | POA: Diagnosis not present

## 2024-04-06 DIAGNOSIS — S32010S Wedge compression fracture of first lumbar vertebra, sequela: Secondary | ICD-10-CM

## 2024-04-06 DIAGNOSIS — M5136 Other intervertebral disc degeneration, lumbar region with discogenic back pain only: Secondary | ICD-10-CM | POA: Diagnosis not present

## 2024-04-06 DIAGNOSIS — G8929 Other chronic pain: Secondary | ICD-10-CM

## 2024-04-06 MED ORDER — LIDOCAINE HCL 1 % IJ SOLN
2.0000 mL | INTRAMUSCULAR | Status: AC | PRN
  Administered 2024-04-06: 2 mL

## 2024-04-06 MED ORDER — BETAMETHASONE SOD PHOS & ACET 6 (3-3) MG/ML IJ SUSP
6.0000 mg | INTRAMUSCULAR | Status: AC | PRN
  Administered 2024-04-06: 6 mg via INTRA_ARTICULAR

## 2024-04-06 MED ORDER — BUPIVACAINE HCL 0.25 % IJ SOLN
2.0000 mL | INTRAMUSCULAR | Status: AC | PRN
  Administered 2024-04-06: 2 mL via INTRA_ARTICULAR

## 2024-04-06 NOTE — Progress Notes (Signed)
 Donna Green - 83 y.o. female MRN [id number]  Date of birth: 04-21-1941  Office Visit Note: Visit Date: 04/06/2024 PCP: Rexanne Ingle, MD Referred by: Rexanne Ingle, MD  Subjective: Chief Complaint  Patient presents with   Left Hip - Pain   HPI: Donna Green is a pleasant 83 y.o. female who presents today for acute on chronic left hip and low back pain.  The patient's daughter, Donna Green, is present during the visit today and does help provide HPI.  A lot of her medical issues/pain started back in October 2024 when she had a fall which resulted in an L1 compression fracture.  She was hospitalized for quite some time and was discharged home then with an home physical therapy for 6 weeks.  Since this time she has been requiring the use of a walker to ambulate.  Per her daughter, Donna Green, they did change her medicine in the hospital and she ended up undergoing A-fib with RVR which required acute treatment and medication changes in the short-term.  She now feels she is in a good place healthwise to seek treatment for her low back and hip pain.  Donna Green states the pain is worse pointing to the low back and the posterior hip as well as the lateral side.  Reports this as an achy pain, occasionally will get burning but no numbness or tingling extending down past the knee.  Occasionally this will wrap over the front part of the hip as well, usually not into the groin however.  She did not have follow-up outside of the hospital from her compression fracture, but would like to meet with a spine physician regarding this and her low back.  Pertinent ROS were reviewed with the patient and found to be negative unless otherwise specified above in HPI.   Assessment & Plan: Visit Diagnoses:  1. Chronic left hip pain   2. Greater trochanteric pain syndrome of left lower extremity   3. Unilateral primary osteoarthritis, left hip   4. Degeneration of intervertebral disc of lumbar region with discogenic back  pain   5. Closed compression fracture of L1 vertebra, sequela    Plan: Impression is acute on chronic left > right hip as well as low back pain which is multifactorial in nature.  She does have advanced lumbar DDD at the L5-S1 level with endplate sclerosis as well as vacuum phenomenon, I do think her lumbar DDD and weak posterior chain is contributing.  She also has hip pain with mild osteoarthritic changes although more pain indicative of greater trochanteric pain syndrome with pain over the lateral hip.  Through shared decision making, we did proceed with greater trochanteric injection, patient tolerated well.  I advised on postinjection protocol.  I did recommend heat as well as Tylenol .  Do think she would benefit from topical lidocaine  patches both for the low back and the hip, the daughter will obtain these for her.  She would likely benefit from some physical therapy but currently is not interested at this time.  Did advise on a tall walker to help with posture when walking using the walker.  In terms of her back, she has not had appropriate follow-up for her lumbar compression fractures, she is interested in seeing a spine physician specifically for this, we will have her make an appointment with Dr. Georgina.  Follow-up: Return for Make new patient Appt for low back pain (hx compression fx) with Dr. Georgina .   Meds & Orders: No orders of the  defined types were placed in this encounter.   Orders Placed This Encounter  Procedures   Large Joint Inj   XR HIP UNILAT W OR W/O PELVIS 2-3 VIEWS LEFT     Procedures: Large Joint Inj: L greater trochanter on 04/06/2024 1:37 PM Indications: pain Details: 25 G 1.5 in needle, lateral approach Medications: 2 mL lidocaine  1 %; 2 mL bupivacaine  0.25 %; 6 mg betamethasone  acetate-betamethasone  sodium phosphate 6 (3-3) MG/ML Outcome: tolerated well, no immediate complications  Greater Trochanteric Bursa Injection, Left After discussion on  risk/benefits/indications, an informed verbal consent was obtained and a timeout was performed. The patient was lying in lateral recumbent position on exam table. The area overlying the trochanteric bursa was prepped with Betadine and alcohol swab then injected with 2:2:1 lidocaine :bupivicaine:celestone . Patient tolerated procedure well without immediate complications.  Procedure, treatment alternatives, risks and benefits explained, specific risks discussed. Consent was given by the patient. Immediately prior to procedure a time out was called to verify the correct patient, procedure, equipment, support staff and site/side marked as required. Patient was prepped and draped in the usual sterile fashion.          Clinical History: No specialty comments available.  She reports that she has never smoked. She has never used smokeless tobacco.  Recent Labs    06/03/23 0254  HGBA1C 7.7*   *Reviewed hemoglobin A1c today.  Objective:    Physical Exam  Gen: Well-appearing, in no acute distress; non-toxic CV: Well-perfused. Warm.  Resp: Breathing unlabored on room air; no wheezing. Psych: Fluid speech in conversation; appropriate affect; normal thought process  Ortho Exam - Bilateral hips: + TTP over the greater trochanteric region left greater than right.  There is associated weakness which does appear to be true weakness as well as secondary to pain.  No pain with logroll testing, positive FADIR and FABER testing.  - Lumbar: Patient does walk with a forward flexed posture.  There is pain and tenderness over the left side of the lumbar paraspinal musculature as well as near the midline at L5-S1, there is no upper lumbar midline spinous process TTP.  Negative SLR.  Imaging: XR HIP UNILAT W OR W/O PELVIS 2-3 VIEWS LEFT Result Date: 04/06/2024 2 views of the left hip including AP pelvis and left hip lateral film were ordered and reviewed by myself today.  There is mild osteoarthritic change  about the left hip joint with no areas of full-thickness cartilage loss.  There is scattered calcification within the pelvis.  There is incompletely visualized advanced degenerative disc disease at the L5-S1 region.  Mild SI joint sclerosis bilaterally.  There is significant vascular calcification noted bilaterally.  No acute fracture of the left hip.   *I did review the lumbar spine CAT scan from 06/02/2023 today.  There is advanced lower L5-S1 advanced degenerative change.  There is a compression deformity of L1, as well as additional chronic mild compression deformities throughout the lumbar spine.  There is L5 on S1 anterior listhesis.  Narrative & Impression  CLINICAL DATA:  Low back injury. Short fall down steps at home. Landed on her back.   EXAM: CT LUMBAR SPINE WITHOUT CONTRAST   TECHNIQUE: Multidetector CT imaging of the lumbar spine was performed without intravenous contrast administration. Multiplanar CT image reconstructions were also generated.   RADIATION DOSE REDUCTION: This exam was performed according to the departmental dose-optimization program which includes automated exposure control, adjustment of the mA and/or kV according to patient size and/or use of iterative  reconstruction technique.   COMPARISON:  CT abdomen and pelvis and reconstructions 03/22/2022.   FINDINGS: Segmentation: 5 lumbar type vertebrae.   Alignment: No new abnormality. Again noted is a slight dextroscoliosis, a trace chronic degenerative retrolisthesis at L2-3, and a 1 cm chronic grade 1 anterolisthesis related to chronic disc collapse and DJD at L5-S1.   Vertebrae: Osteopenia. There is a minimal new concave compression deformity of the L1 upper plate and a slight posterior bowing of the dorsal vertebral cortex.   This could be acute and is certainly new from 03/22/2022, but could be subacute as there is linear sclerosis in portions of the vertebral body which may be seen with  compaction of trabecular elements with an acute injury, or with a healing response due to a subacute injury.   No other new injury is seen. There are mild chronic upper plate anterior wedge compression fractures of the T12 and L4 vertebral bodies and a chronic mild lower plate anterior wedge compression fracture of L2.   There is lumbar spondylosis. Chronic moderate reactive endplate sclerosis opposite L5-S1. Prominent superior endplate Schmorl's node formation T12 also chronic.   All pedicles and posterior elements are intact, with spinous process abutment L3-5 with opposing surface spurring.   No primary pathologic process is suspected. In addition there is no sacral insufficiency fracture.   Paraspinal and other soft tissues: No paraspinal mass or hematoma. Slight paravertebral edema at L1.   Disc levels: The lumbar disc spaces are normal in height except for a chronic advanced collapse of L5-S1, with vacuum phenomenon.   At T12-L1 and L1-2 there is no significant soft tissue or bony encroachment on the thecal sac and foramina.   At L2-3, there is mild spinal canal stenosis due to posterior disc osteophyte complex and facet joint spurring change, bilateral mild to moderate acquired foraminal stenosis.   At L3-4, there is mild-to-moderate spinal canal stenosis due to dorsal ligamentous thickening, posterior disc osteophyte complex and moderate facet osteophytes. There are bilateral mild-to-moderate foraminal stenosis.   At L4-5, there is a mild nonstenosing posterior disc bulge, with effacement in the lateral recesses due to moderate facet hypertrophy. Due to foraminal disc bulging there is moderate left and mild right foraminal stenosis.   At L5-S1, chronic disc collapse, vacuum phenomenon is noted, and a small right paracentral calcified disc extrusion. Due to the anterolisthesis there is moderate spinal canal stenosis and severe bilateral foraminal stenosis. Advanced  facet hypertrophy.   IMPRESSION: 1. Minimal new concave compression deformity of the L1 upper plate with slight posterior bowing of the dorsal vertebral cortex. This could be acute or subacute. There is only mild paravertebral edema and no paravertebral hematoma. 2. No other recent fractures are seen. Chronic compression fractures described above. 3. Osteopenia, degenerative and chronic changes. 4. Spinous process abutment L3-5 with opposing surface spurring. 5. No new abnormal alignment. 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 6. Remaining detailed findings discussed above.     Electronically Signed   By: Francis Quam M.D.   On: 06/02/2023 02:35    *Previous X-ray report ([facility]):  Hip unilat 2-3 Views, left Reviewed date:01/23/2024 04:22:25 PM Interpretation: Performing Lab: Notes/Report: ** RADIOLOGY REPORT BY [facility] **  CLINICAL DATA: Left hip pain.  EXAM: LEFT HIP (WITH PELVIS) 2-3 VIEWS  COMPARISON: pelvis and right hip radiographs 02/04/2013  FINDINGS: There is diffuse decreased bone mineralization. Mild superomedial left femoroacetabular joint space narrowing. Minimal left femoral head-neck junction degenerative  spurring. Mild left sacroiliac joint space narrowing, subchondral sclerosis, and vacuum phenomenon. No acute fracture dislocation. High-grade atherosclerotic calcifications.  IMPRESSION: 1. Mild left femoroacetabular osteoarthritis. 2. Mild left sacroiliac osteoarthritis.   Electronically Signed By: Tanda Lyons M.D. On: 01/23/2024 14:04   Past Medical/Family/Surgical/Social History: Medications & Allergies reviewed per EMR, new medications updated. Patient Active Problem List   Diagnosis Date Noted   Acute ischemic stroke (HCC) 06/25/2023   Stroke (cerebrum) (HCC) 06/25/2023   Hypertensive emergency 06/24/2023   Facial droop-resolved 06/24/2023   Confusion 06/24/2023   CKD stage  3b, GFR 30-44 ml/min (HCC) 06/24/2023   GAD (generalized anxiety disorder) 06/24/2023   Acute metabolic encephalopathy 06/24/2023   Fall 06/07/2023   Palliative care by specialist 06/07/2023   Goals of care, counseling/discussion 06/07/2023   Paroxysmal atrial fibrillation (HCC) 06/03/2023   Hypertensive crisis 06/02/2023   Hypokalemia 06/02/2023   Hypomagnesemia 06/02/2023   Large hiatal hernia 06/02/2023   Atrial fibrillation with RVR (HCC) 06/02/2023   Closed compression fracture of L1 vertebra (HCC) 06/02/2023   COVID-19 virus infection 03/23/2022   Anxiety 03/23/2022   Acute kidney injury superimposed on chronic kidney disease (HCC) 03/22/2022   Essential hypertension 03/09/2015   Hyperlipidemia 03/09/2015   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 03/07/2015   Past Medical History:  Diagnosis Date   Anxiety 03/23/2022   Chronic kidney disease    Diabetes mellitus without complication (HCC)    Headache    Hyperlipidemia    Hypertension    Right hemiparesis (HCC)    Small bowel obstruction (HCC)    Stroke (HCC)    x2   Family History  Problem Relation Age of Onset   Diabetes Mother    Heart disease Mother    Parkinsonism Father    Diabetes Sister    Diabetes Brother    Cancer Brother    Diabetes Maternal Grandmother    Past Surgical History:  Procedure Laterality Date   BLADDER SURGERY     BOWEL RESECTION     EYE SURGERY     TUBAL LIGATION     Social History   Occupational History   Occupation: retired   Tobacco Use   Smoking status: Never   Smokeless tobacco: Never  Vaping Use   Vaping status: Never Used  Substance and Sexual Activity   Alcohol use: No   Drug use: No   Sexual activity: Not on file

## 2024-04-06 NOTE — Progress Notes (Signed)
 Patient says that she had a fall in October where she had a fracture in her low back. She says that several things have happened since then, including a couple of strokes. She has also, in the time since then, had pain in the left hip. She says that the pain wraps around the back, the side, and into the front and groin. She has worse pain with walking. She is only able to take Tylenol , which does not give her any relief. She has tried other topical treatments, as well, which do not give her any relief.

## 2024-05-13 ENCOUNTER — Other Ambulatory Visit (INDEPENDENT_AMBULATORY_CARE_PROVIDER_SITE_OTHER): Payer: Self-pay

## 2024-05-13 ENCOUNTER — Ambulatory Visit: Admitting: Orthopedic Surgery

## 2024-05-13 VITALS — BP 174/83 | HR 88 | Ht 65.5 in | Wt 152.0 lb

## 2024-05-13 DIAGNOSIS — M545 Low back pain, unspecified: Secondary | ICD-10-CM

## 2024-05-13 MED ORDER — METHOCARBAMOL 500 MG PO TABS: 500.0000 mg | ORAL_TABLET | Freq: Four times a day (QID) | ORAL | 0 refills | Status: AC | PRN

## 2024-05-13 NOTE — Progress Notes (Signed)
 Orthopedic Spine Surgery Office Note  Assessment: Patient is a 83 y.o. female with low back pain that radiates into the lateral thighs and legs.  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  Suspect L5 radiculopathy   Plan: -Patient has tried PT, Tylenol , lidocaine  patches, hip injection -Patient and her daughter stated that surgery is not an option.  Accordingly, I covered the remaining nonoperative options with her including pain management with her.  She has allergies to all the narcotics so pain management would not be a great option for her.  Her remaining options would be to try a muscle relaxer and, since the majority of her pain is in the back, facet injections.  If she does well with facet injections, she would potentially be a candidate for RFA.  After our conversation, patient wanted to proceed with a muscle relaxer.  Robaxin was prescribed to her today.  She also wanted to try the facet injections.  Referral provided to her today for that. -Patient should return to the office on an as-needed basis   Patient expressed understanding of the plan and all questions were answered to the patient's satisfaction.   ___________________________________________________________________________   History:  Patient is a 83 y.o. female who presents today for lumbar spine.  Patient has had over a year of low back pain that radiates into her bilateral lower extremities.  She feels the pain along the lateral aspect of her thighs and legs.  She did have a fall about a year ago and was diagnosed with a compression fracture.  The pain then was higher up in her lumbar spine.  The pain now is felt in the lower lumbar spine and goes into the lower extremities as described above.  She feels the pain on a daily basis.  The pain is felt with activity and at rest.   Weakness: denies Symptoms of imbalance: denies  Paresthesias and numbness: denies Bowel or bladder incontinence:  denies Saddle anesthesia: denies  Treatments tried: PT, Tylenol , lidocaine  patches, hip injection  Review of systems: Denies fevers and chills, night sweats, unexplained weight loss, history of cancer. Has had pain that wakes her at night  Past medical history: History of stroke Anxiety HTN HLD Atrial fibrillation DM GERD CKD  Allergies: narcotics, allopurinol, NSAIDs, colchicine  Past surgical history:  Tubal ligation Bladder surgery Eye surgery Bowel resection  Social history: Denies use of nicotine product (smoking, vaping, patches, smokeless) Alcohol use: denies Denies recreational drug use   Physical Exam:  BMI of 24.9  General: no acute distress, appears stated age Neurologic: alert, answering questions appropriately, following commands Respiratory: unlabored breathing on room air, symmetric chest rise Psychiatric: appropriate affect, normal cadence to speech   MSK (spine):  -Strength exam      Left  Right EHL    5/5  5/5 TA    5/5  5/5 GSC    5/5  5/5 Knee extension  5/5  5/5 Hip flexion   5/5  5/5  -Sensory exam    Sensation intact to light touch in L3-S1 nerve distributions of bilateral lower extremities  -Straight leg raise: negative bilaterally -Clonus: no beats bilaterally  -Left hip exam: no pain through range of motion -Right hip exam: no pain through range of motion  Imaging: XRs of the lumbar spine from 05/13/2024 were independently reviewed and interpreted, showing disc height loss with spondylolisthesis at L5/S1.  Anterior osteophyte formation is seen at L5/S1.  Subtle spondylolisthesis at L4/5.  Compression fracture seen  at L1.  No other fracture seen.  No dislocation seen.  CT of the lumbar spine from 06/02/2023 was independently reviewed and interpreted, showing vacuum disc phenomenon with anterior listhesis at L5/S1.  Hyperdensity seen within the endplates of L5 and S1.  Significant facet arthrosis at L4/5 and L5/S1.  Compression  fracture seen with minimal anterior height loss at L1.   Patient name: Donna Green Patient MRN: [id number] Date of visit: 05/13/24

## 2024-05-27 ENCOUNTER — Encounter: Payer: Self-pay | Admitting: Physical Medicine and Rehabilitation

## 2024-05-27 ENCOUNTER — Ambulatory Visit: Admitting: Physical Medicine and Rehabilitation

## 2024-05-27 DIAGNOSIS — M545 Low back pain, unspecified: Secondary | ICD-10-CM

## 2024-05-27 DIAGNOSIS — M47816 Spondylosis without myelopathy or radiculopathy, lumbar region: Secondary | ICD-10-CM

## 2024-05-27 DIAGNOSIS — G8929 Other chronic pain: Secondary | ICD-10-CM | POA: Diagnosis not present

## 2024-05-27 DIAGNOSIS — M47817 Spondylosis without myelopathy or radiculopathy, lumbosacral region: Secondary | ICD-10-CM

## 2024-05-27 NOTE — Progress Notes (Signed)
 Donna Green - 83 y.o. female MRN [id number]  Date of birth: [birth year]  Office Visit Note: Visit Date: 05/27/2024 PCP: Rexanne Ingle, MD Referred by: Rexanne Ingle, MD  Subjective: Chief Complaint  Patient presents with   Lower Back - Pain   HPI: Donna Green is a 83 y.o. female who comes in today per the request of Dr. Ozell Ada for evaluation of chronic, worsening and severe bilateral lower back pain, intermittent radiation of pain to hips and legs. Patient is extremely hard of hearing, daughter at bedside. Pain ongoing for over a year, started after fall last October where she sustained L1 compression fracture. Her pain worsens with standing and walking. Also reports cold weather causes severe discomfort. She describes pain as sore and sharp sensation, currently rates as 8 out of 10. Some relief of pain with home exercise regimen, rest and use of medications. She tried Robaxin, however had to discontinue due to issues with heart racing. History of formal physical therapy with minimal relief of pain. CT of lumbar spine shows minimal compression deformity of L1 upper plate, there is significant arthritis at L4-L5 and L5-S1. She is currently using walker to assist with ambulation. No recent trauma or falls.      Review of Systems  Musculoskeletal:  Positive for back pain.  Neurological:  Negative for tingling, sensory change, focal weakness and weakness.  All other systems reviewed and are negative.  Otherwise per HPI.  Assessment & Plan: Visit Diagnoses:    ICD-10-CM   1. Chronic bilateral low back pain without sciatica  M54.50    G89.29     2. Spondylosis without myelopathy or radiculopathy, lumbosacral region  M47.817     3. Facet arthropathy, lumbar  M47.816        Plan: Findings:  Chronic, worsening and severe bilateral lower back pain, intermittent radiation of pain to hips and legs. Patient continues to have severe pain despite good conservative therapy such as  formal physical therapy, home exercise regimen, rest and use of medications.  Patient's clinical presentation and exam are consistent with facet mediated pain.  There is fairly significant facet arthropathy at the levels of L4-L5 and L5-S1 on prior CT of lumbar spine.  She does have pain with lumbar extension today.  Very difficult for her to move from sitting to standing position today.  I discussed treatment plan in detail today.  Next step is to perform diagnostic bilateral L4-L5 and L5-S1 medial branch blocks under fluoroscopic guidance.  If good relief of pain with diagnostic blocks we discussed possibility of longer sustained pain relief with radiofrequency ablation. Daughter verbalizes understanding, patient has no questions at this time. No red flag symptoms noted upon exam today.     Meds & Orders: No orders of the defined types were placed in this encounter.  No orders of the defined types were placed in this encounter.   Follow-up: Return for Bilateral L4-L5 and L5-S1 medial branch blocks.   Procedures: No procedures performed      Clinical History: CLINICAL DATA:  Low back injury. Short fall down steps at home. Landed on her back.   EXAM: CT LUMBAR SPINE WITHOUT CONTRAST   TECHNIQUE: Multidetector CT imaging of the lumbar spine was performed without intravenous contrast administration. Multiplanar CT image reconstructions were also generated.   RADIATION DOSE REDUCTION: This exam was performed according to the departmental dose-optimization program which includes automated exposure control, adjustment of the mA and/or kV according to patient size and/or  use of iterative reconstruction technique.   COMPARISON:  CT abdomen and pelvis and reconstructions 03/22/2022.   FINDINGS: Segmentation: 5 lumbar type vertebrae.   Alignment: No new abnormality. Again noted is a slight dextroscoliosis, a trace chronic degenerative retrolisthesis at L2-3, and a 1 cm chronic grade 1  anterolisthesis related to chronic disc collapse and DJD at L5-S1.   Vertebrae: Osteopenia. There is a minimal new concave compression deformity of the L1 upper plate and a slight posterior bowing of the dorsal vertebral cortex.   This could be acute and is certainly new from 03/22/2022, but could be subacute as there is linear sclerosis in portions of the vertebral body which may be seen with compaction of trabecular elements with an acute injury, or with a healing response due to a subacute injury.   No other new injury is seen. There are mild chronic upper plate anterior wedge compression fractures of the T12 and L4 vertebral bodies and a chronic mild lower plate anterior wedge compression fracture of L2.   There is lumbar spondylosis. Chronic moderate reactive endplate sclerosis opposite L5-S1. Prominent superior endplate Schmorl's node formation T12 also chronic.   All pedicles and posterior elements are intact, with spinous process abutment L3-5 with opposing surface spurring.   No primary pathologic process is suspected. In addition there is no sacral insufficiency fracture.   Paraspinal and other soft tissues: No paraspinal mass or hematoma. Slight paravertebral edema at L1.   Disc levels: The lumbar disc spaces are normal in height except for a chronic advanced collapse of L5-S1, with vacuum phenomenon.   At T12-L1 and L1-2 there is no significant soft tissue or bony encroachment on the thecal sac and foramina.   At L2-3, there is mild spinal canal stenosis due to posterior disc osteophyte complex and facet joint spurring change, bilateral mild to moderate acquired foraminal stenosis.   At L3-4, there is mild-to-moderate spinal canal stenosis due to dorsal ligamentous thickening, posterior disc osteophyte complex and moderate facet osteophytes. There are bilateral mild-to-moderate foraminal stenosis.   At L4-5, there is a mild nonstenosing posterior disc bulge,  with effacement in the lateral recesses due to moderate facet hypertrophy. Due to foraminal disc bulging there is moderate left and mild right foraminal stenosis.   At L5-S1, chronic disc collapse, vacuum phenomenon is noted, and a small right paracentral calcified disc extrusion. Due to the anterolisthesis there is moderate spinal canal stenosis and severe bilateral foraminal stenosis. Advanced facet hypertrophy.   IMPRESSION: 1. Minimal new concave compression deformity of the L1 upper plate with slight posterior bowing of the dorsal vertebral cortex. This could be acute or subacute. There is only mild paravertebral edema and no paravertebral hematoma. 2. No other recent fractures are seen. Chronic compression fractures described above. 3. Osteopenia, degenerative and chronic changes. 4. Spinous process abutment L3-5 with opposing surface spurring. 5. No new abnormal alignment. 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 6. Remaining detailed findings discussed above.     Electronically Signed   By: Francis Quam M.D.   On: 06/02/2023 02:35   She reports that she has never smoked. She has never used smokeless tobacco.  Recent Labs    06/03/23 0254  HGBA1C 7.7*    Objective:  VS:  HT:    WT:   BMI:     BP:   HR: bpm  TEMP: ( )  RESP:  Physical Exam Vitals and nursing note reviewed.  HENT:  Head: Normocephalic and atraumatic.     Right Ear: External ear normal.     Left Ear: External ear normal.     Nose: Nose normal.     Mouth/Throat:     Mouth: Mucous membranes are moist.  Eyes:     Extraocular Movements: Extraocular movements intact.  Cardiovascular:     Rate and Rhythm: Normal rate.     Pulses: Normal pulses.  Pulmonary:     Effort: Pulmonary effort is normal.  Abdominal:     General: Abdomen is flat. There is no distension.  Musculoskeletal:        General: Tenderness present.     Cervical back: Normal range of  motion.     Comments: Patient is slow to rise from seated position to standing. Pain noted with facet loading and lumbar extension. 5/5 strength noted with bilateral hip flexion, knee flexion/extension, ankle dorsiflexion/plantarflexion and EHL. No clonus noted bilaterally. No pain upon palpation of greater trochanters. No pain with internal/external rotation of bilateral hips. Sensation intact bilaterally. Negative slump test bilaterally. Ambulates with walker, gait slow and unsteady.   Skin:    General: Skin is warm and dry.     Capillary Refill: Capillary refill takes less than 2 seconds.  Neurological:     Mental Status: She is alert and oriented to person, place, and time.     Gait: Gait abnormal.  Psychiatric:        Mood and Affect: Mood normal.        Behavior: Behavior normal.     Ortho Exam  Imaging: No results found.  Past Medical/Family/Surgical/Social History: Medications & Allergies reviewed per EMR, new medications updated. Patient Active Problem List   Diagnosis Date Noted   Acute ischemic stroke (HCC) 06/25/2023   Stroke (cerebrum) (HCC) 06/25/2023   Hypertensive emergency 06/24/2023   Facial droop-resolved 06/24/2023   Confusion 06/24/2023   CKD stage 3b, GFR 30-44 ml/min (HCC) 06/24/2023   GAD (generalized anxiety disorder) 06/24/2023   Acute metabolic encephalopathy 06/24/2023   Fall 06/07/2023   Palliative care by specialist 06/07/2023   Goals of care, counseling/discussion 06/07/2023   Paroxysmal atrial fibrillation (HCC) 06/03/2023   Hypertensive crisis 06/02/2023   Hypokalemia 06/02/2023   Hypomagnesemia 06/02/2023   Large hiatal hernia 06/02/2023   Atrial fibrillation with RVR (HCC) 06/02/2023   Closed compression fracture of L1 vertebra (HCC) 06/02/2023   COVID-19 virus infection 03/23/2022   Anxiety 03/23/2022   Acute kidney injury superimposed on chronic kidney disease 03/22/2022   Essential hypertension 03/09/2015   Hyperlipidemia 03/09/2015    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 03/07/2015   Past Medical History:  Diagnosis Date   Anxiety 03/23/2022   Chronic kidney disease    Diabetes mellitus without complication (HCC)    Headache    Hyperlipidemia    Hypertension    Right hemiparesis (HCC)    Small bowel obstruction (HCC)    Stroke (HCC)    x2   Family History  Problem Relation Age of Onset   Diabetes Mother    Heart disease Mother    Parkinsonism Father    Diabetes Sister    Diabetes Brother    Cancer Brother    Diabetes Maternal Grandmother    Past Surgical History:  Procedure Laterality Date   BLADDER SURGERY     BOWEL RESECTION     EYE SURGERY     TUBAL LIGATION  Social History   Occupational History   Occupation: retired   Tobacco Use   Smoking status: Never   Smokeless tobacco: Never  Vaping Use   Vaping status: Never Used  Substance and Sexual Activity   Alcohol use: No   Drug use: No   Sexual activity: Not on file

## 2024-05-27 NOTE — Progress Notes (Signed)
 Pain Scale   Average Pain 7 Patient advising she has lower back pain radiating to left hip area. Pain is constant.  Patient advising also taking the Meloxicam makes her heart rate elevate, so she quit taking it.        +Driver, -BT, -Dye Allergies.

## 2024-06-01 ENCOUNTER — Telehealth: Payer: Self-pay | Admitting: Physical Medicine and Rehabilitation

## 2024-06-01 NOTE — Telephone Encounter (Signed)
 Patient daughter called and said you left a message to call back. She was returning your call. CB#[phone]

## 2024-06-15 ENCOUNTER — Encounter: Payer: Self-pay | Admitting: Radiology

## 2024-06-16 DIAGNOSIS — I700 Atherosclerosis of aorta: Secondary | ICD-10-CM | POA: Diagnosis not present

## 2024-06-16 DIAGNOSIS — E1122 Type 2 diabetes mellitus with diabetic chronic kidney disease: Secondary | ICD-10-CM | POA: Diagnosis not present

## 2024-06-16 DIAGNOSIS — I480 Paroxysmal atrial fibrillation: Secondary | ICD-10-CM | POA: Diagnosis not present

## 2024-06-16 DIAGNOSIS — E7800 Pure hypercholesterolemia, unspecified: Secondary | ICD-10-CM | POA: Diagnosis not present

## 2024-06-16 DIAGNOSIS — F411 Generalized anxiety disorder: Secondary | ICD-10-CM | POA: Diagnosis not present

## 2024-06-16 DIAGNOSIS — E1140 Type 2 diabetes mellitus with diabetic neuropathy, unspecified: Secondary | ICD-10-CM | POA: Diagnosis not present

## 2024-06-16 DIAGNOSIS — M810 Age-related osteoporosis without current pathological fracture: Secondary | ICD-10-CM | POA: Diagnosis not present

## 2024-06-16 DIAGNOSIS — I10 Essential (primary) hypertension: Secondary | ICD-10-CM | POA: Diagnosis not present

## 2024-06-16 DIAGNOSIS — N1832 Chronic kidney disease, stage 3b: Secondary | ICD-10-CM | POA: Diagnosis not present

## 2024-06-22 ENCOUNTER — Ambulatory Visit: Admitting: Physical Medicine and Rehabilitation

## 2024-06-22 ENCOUNTER — Other Ambulatory Visit: Payer: Self-pay

## 2024-06-22 ENCOUNTER — Encounter: Payer: Self-pay | Admitting: Physical Medicine and Rehabilitation

## 2024-06-22 VITALS — BP 187/99 | HR 84

## 2024-06-22 DIAGNOSIS — M47816 Spondylosis without myelopathy or radiculopathy, lumbar region: Secondary | ICD-10-CM | POA: Diagnosis not present

## 2024-06-22 MED ORDER — BUPIVACAINE HCL 0.5 % IJ SOLN
3.0000 mL | Freq: Once | INTRAMUSCULAR | Status: AC
  Administered 2024-06-22: 3 mL

## 2024-06-22 NOTE — Progress Notes (Signed)
 Pain Scale   Average Pain 5 Patient advising she has chronic lower back pain radiating to left  leg and pain is constant. Patient advised to Follow up with PCP due to elevation of B/P.SABRAPatient advising she was nervous and she will F/U if needed       +Driver, -BT, -Dye Allergies.

## 2024-06-22 NOTE — Progress Notes (Signed)
 Donna Green - 83 y.o. female MRN [id number]  Date of birth: September 18, 1940  Office Visit Note: Visit Date: 06/22/2024 PCP: Rexanne Ingle, MD Referred by: Rexanne Ingle, MD  Subjective: Chief Complaint  Patient presents with   Lower Back - Pain   HPI:  Donna Green is a 83 y.o. female who comes in today at the request of Duwaine Pouch, FNP and Dr. Ozell Ada for planned Bilateral  L4-5 and L5-S1 Lumbar facet/medial branch block with fluoroscopic guidance.  The patient has failed conservative care including home exercise, medications, time and activity modification.  This injection will be diagnostic and hopefully therapeutic.  Please see requesting physician notes for further details and justification.  Exam has shown concordant pain with facet joint loading.   ROS Otherwise per HPI.  Assessment & Plan: Visit Diagnoses:    ICD-10-CM   1. Spondylosis without myelopathy or radiculopathy, lumbar region  M47.816 XR C-ARM NO REPORT    Nerve Block    bupivacaine  (MARCAINE ) 0.5 % (with pres) injection 3 mL      Plan: No additional findings.   Meds & Orders:  Meds ordered this encounter  Medications   bupivacaine  (MARCAINE ) 0.5 % (with pres) injection 3 mL    Orders Placed This Encounter  Procedures   Nerve Block   XR C-ARM NO REPORT    Follow-up: Return for Review Pain Diary.   Procedures: No procedures performed  Lumbar Diagnostic Facet Joint Nerve Block with Fluoroscopic Guidance   Patient: Donna Green      Date of Birth: [birth year] MRN: [id number] PCP: Rexanne Ingle, MD      Visit Date: 06/22/2024   Universal Protocol:    Date/Time: 11/10/252:57 PM  Consent Given By: the patient  Position: PRONE  Additional Comments: Vital signs were monitored before and after the procedure. Patient was prepped and draped in the usual sterile fashion. The correct patient, procedure, and site was verified.   Injection Procedure Details:   Procedure diagnoses:  1.  Spondylosis without myelopathy or radiculopathy, lumbar region      Meds Administered:  Meds ordered this encounter  Medications   bupivacaine  (MARCAINE ) 0.5 % (with pres) injection 3 mL     Laterality: Bilateral  Location/Site: L4-L5, L3 and L4 medial branches and L5-S1, L4 medial branch and L5 dorsal ramus  Needle: 5.0 in., 25 ga.  Short bevel or Quincke spinal needle  Needle Placement: Oblique pedical  Findings:   -Comments: There was excellent flow of contrast along the articular pillars without intravascular flow.  Procedure Details: The fluoroscope beam is vertically oriented in AP and then obliqued 15 to 20 degrees to the ipsilateral side of the desired nerve to achieve the "Scotty dog" appearance.  The skin over the target area of the junction of the superior articulating process and the transverse process (sacral ala if blocking the L5 dorsal rami) was locally anesthetized with a 1 ml volume of 1% Lidocaine  without Epinephrine.  The spinal needle was inserted and advanced in a trajectory view down to the target.   After contact with periosteum and negative aspirate for blood and CSF, correct placement without intravascular or epidural spread was confirmed by injecting 0.5 ml. of Isovue-250.  A spot radiograph was obtained of this image.    Next, a 0.5 ml. volume of the injectate described above was injected. The needle was then redirected to the other facet joint nerves mentioned above if needed.  Prior to the procedure, the patient was  given a Pain Diary which was completed for baseline measurements.  After the procedure, the patient rated their pain every 30 minutes and will continue rating at this frequency for a total of 5 hours.  The patient has been asked to complete the Diary and return to us  by mail, fax or hand delivered as soon as possible.   Additional Comments:  The patient tolerated the procedure well Dressing: 2 x 2 sterile gauze and Band-Aid     Post-procedure details: Patient was observed during the procedure. Post-procedure instructions were reviewed.  Patient left the clinic in stable condition.   Clinical History: CLINICAL DATA:  Low back injury. Short fall down steps at home. Landed on her back.   EXAM: CT LUMBAR SPINE WITHOUT CONTRAST   TECHNIQUE: Multidetector CT imaging of the lumbar spine was performed without intravenous contrast administration. Multiplanar CT image reconstructions were also generated.   RADIATION DOSE REDUCTION: This exam was performed according to the departmental dose-optimization program which includes automated exposure control, adjustment of the mA and/or kV according to patient size and/or use of iterative reconstruction technique.   COMPARISON:  CT abdomen and pelvis and reconstructions 03/22/2022.   FINDINGS: Segmentation: 5 lumbar type vertebrae.   Alignment: No new abnormality. Again noted is a slight dextroscoliosis, a trace chronic degenerative retrolisthesis at L2-3, and a 1 cm chronic grade 1 anterolisthesis related to chronic disc collapse and DJD at L5-S1.   Vertebrae: Osteopenia. There is a minimal new concave compression deformity of the L1 upper plate and a slight posterior bowing of the dorsal vertebral cortex.   This could be acute and is certainly new from 03/22/2022, but could be subacute as there is linear sclerosis in portions of the vertebral body which may be seen with compaction of trabecular elements with an acute injury, or with a healing response due to a subacute injury.   No other new injury is seen. There are mild chronic upper plate anterior wedge compression fractures of the T12 and L4 vertebral bodies and a chronic mild lower plate anterior wedge compression fracture of L2.   There is lumbar spondylosis. Chronic moderate reactive endplate sclerosis opposite L5-S1. Prominent superior endplate Schmorl's node formation T12 also chronic.    All pedicles and posterior elements are intact, with spinous process abutment L3-5 with opposing surface spurring.   No primary pathologic process is suspected. In addition there is no sacral insufficiency fracture.   Paraspinal and other soft tissues: No paraspinal mass or hematoma. Slight paravertebral edema at L1.   Disc levels: The lumbar disc spaces are normal in height except for a chronic advanced collapse of L5-S1, with vacuum phenomenon.   At T12-L1 and L1-2 there is no significant soft tissue or bony encroachment on the thecal sac and foramina.   At L2-3, there is mild spinal canal stenosis due to posterior disc osteophyte complex and facet joint spurring change, bilateral mild to moderate acquired foraminal stenosis.   At L3-4, there is mild-to-moderate spinal canal stenosis due to dorsal ligamentous thickening, posterior disc osteophyte complex and moderate facet osteophytes. There are bilateral mild-to-moderate foraminal stenosis.   At L4-5, there is a mild nonstenosing posterior disc bulge, with effacement in the lateral recesses due to moderate facet hypertrophy. Due to foraminal disc bulging there is moderate left and mild right foraminal stenosis.   At L5-S1, chronic disc collapse, vacuum phenomenon is noted, and a small right paracentral calcified disc extrusion. Due to the anterolisthesis there is moderate spinal canal stenosis and  severe bilateral foraminal stenosis. Advanced facet hypertrophy.   IMPRESSION: 1. Minimal new concave compression deformity of the L1 upper plate with slight posterior bowing of the dorsal vertebral cortex. This could be acute or subacute. There is only mild paravertebral edema and no paravertebral hematoma. 2. No other recent fractures are seen. Chronic compression fractures described above. 3. Osteopenia, degenerative and chronic changes. 4. Spinous process abutment L3-5 with opposing surface spurring. 5. No new abnormal  alignment. 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 6. Remaining detailed findings discussed above.     Electronically Signed   By: Francis Quam M.D.   On: 06/02/2023 02:35     Objective:  VS:  HT:    WT:   BMI:     BP:(!) 187/99  HR:84bpm  TEMP: ( )  RESP:  Physical Exam Vitals and nursing note reviewed.  Constitutional:      General: She is not in acute distress.    Appearance: Normal appearance. She is not ill-appearing.  HENT:     Head: Normocephalic and atraumatic.     Right Ear: External ear normal.     Left Ear: External ear normal.  Eyes:     Extraocular Movements: Extraocular movements intact.  Cardiovascular:     Rate and Rhythm: Normal rate.     Pulses: Normal pulses.  Pulmonary:     Effort: Pulmonary effort is normal. No respiratory distress.  Abdominal:     General: There is no distension.     Palpations: Abdomen is soft.  Musculoskeletal:        General: Tenderness present.     Cervical back: Neck supple.     Right lower leg: No edema.     Left lower leg: No edema.     Comments: Patient has good distal strength with no pain over the greater trochanters.  No clonus or focal weakness.  Skin:    Findings: No erythema, lesion or rash.  Neurological:     General: No focal deficit present.     Mental Status: She is alert and oriented to person, place, and time.     Cranial Nerves: No cranial nerve deficit.     Sensory: No sensory deficit.     Motor: No weakness or abnormal muscle tone.     Coordination: Coordination normal.     Gait: Gait abnormal.  Psychiatric:        Mood and Affect: Mood normal.        Behavior: Behavior normal.      Imaging: No results found.

## 2024-06-22 NOTE — Procedures (Signed)
 Lumbar Diagnostic Facet Joint Nerve Block with Fluoroscopic Guidance   Patient: Donna Green      Date of Birth: 05-Mar-1941 MRN: [id number] PCP: Rexanne Ingle, MD      Visit Date: 06/22/2024   Universal Protocol:    Date/Time: 11/10/252:57 PM  Consent Given By: the patient  Position: PRONE  Additional Comments: Vital signs were monitored before and after the procedure. Patient was prepped and draped in the usual sterile fashion. The correct patient, procedure, and site was verified.   Injection Procedure Details:   Procedure diagnoses:  1. Spondylosis without myelopathy or radiculopathy, lumbar region      Meds Administered:  Meds ordered this encounter  Medications   bupivacaine  (MARCAINE ) 0.5 % (with pres) injection 3 mL     Laterality: Bilateral  Location/Site: L4-L5, L3 and L4 medial branches and L5-S1, L4 medial branch and L5 dorsal ramus  Needle: 5.0 in., 25 ga.  Short bevel or Quincke spinal needle  Needle Placement: Oblique pedical  Findings:   -Comments: There was excellent flow of contrast along the articular pillars without intravascular flow.  Procedure Details: The fluoroscope beam is vertically oriented in AP and then obliqued 15 to 20 degrees to the ipsilateral side of the desired nerve to achieve the "Scotty dog" appearance.  The skin over the target area of the junction of the superior articulating process and the transverse process (sacral ala if blocking the L5 dorsal rami) was locally anesthetized with a 1 ml volume of 1% Lidocaine  without Epinephrine.  The spinal needle was inserted and advanced in a trajectory view down to the target.   After contact with periosteum and negative aspirate for blood and CSF, correct placement without intravascular or epidural spread was confirmed by injecting 0.5 ml. of Isovue-250.  A spot radiograph was obtained of this image.    Next, a 0.5 ml. volume of the injectate described above was injected. The needle  was then redirected to the other facet joint nerves mentioned above if needed.  Prior to the procedure, the patient was given a Pain Diary which was completed for baseline measurements.  After the procedure, the patient rated their pain every 30 minutes and will continue rating at this frequency for a total of 5 hours.  The patient has been asked to complete the Diary and return to us  by mail, fax or hand delivered as soon as possible.   Additional Comments:  The patient tolerated the procedure well Dressing: 2 x 2 sterile gauze and Band-Aid    Post-procedure details: Patient was observed during the procedure. Post-procedure instructions were reviewed.  Patient left the clinic in stable condition.

## 2024-07-23 ENCOUNTER — Ambulatory Visit: Admitting: Physical Medicine and Rehabilitation

## 2024-07-23 ENCOUNTER — Other Ambulatory Visit: Payer: Self-pay

## 2024-07-23 VITALS — BP 172/80 | HR 79

## 2024-07-23 DIAGNOSIS — M47816 Spondylosis without myelopathy or radiculopathy, lumbar region: Secondary | ICD-10-CM

## 2024-07-23 MED ORDER — BUPIVACAINE HCL 0.5 % IJ SOLN
3.0000 mL | Freq: Once | INTRAMUSCULAR | Status: AC
  Administered 2024-07-23: 3 mL

## 2024-07-23 NOTE — Progress Notes (Unsigned)
 Pain Scale   Average Pain 2 Patient advising she has chronic lower back pain radiating to left hip. Patient is constant        +Driver, -BT, -Dye Allergies.

## 2024-07-24 ENCOUNTER — Other Ambulatory Visit: Payer: Self-pay | Admitting: Physical Medicine and Rehabilitation

## 2024-07-24 ENCOUNTER — Encounter: Payer: Self-pay | Admitting: Physical Medicine and Rehabilitation

## 2024-07-24 DIAGNOSIS — M47816 Spondylosis without myelopathy or radiculopathy, lumbar region: Secondary | ICD-10-CM

## 2024-07-24 DIAGNOSIS — G8929 Other chronic pain: Secondary | ICD-10-CM

## 2024-08-03 NOTE — Procedures (Signed)
 Lumbar Diagnostic Facet Joint Nerve Block with Fluoroscopic Guidance   Patient: Donna Green      Date of Birth: 26-Mar-1941 MRN: [id number] PCP: Rexanne Ingle, MD      Visit Date: 07/23/2024   Universal Protocol:    Date/Time: 12/22/20255:36 AM  Consent Given By: the patient  Position: PRONE  Additional Comments: Vital signs were monitored before and after the procedure. Patient was prepped and draped in the usual sterile fashion. The correct patient, procedure, and site was verified.   Injection Procedure Details:   Procedure diagnoses:  1. Spondylosis without myelopathy or radiculopathy, lumbar region      Meds Administered:  Meds ordered this encounter  Medications   bupivacaine  (MARCAINE ) 0.5 % (with pres) injection 3 mL     Laterality: Bilateral  Location/Site: L4-L5, L3 and L4 medial branches and L5-S1, L4 medial branch and L5 dorsal ramus  Needle: 5.0 in., 25 ga.  Short bevel or Quincke spinal needle  Needle Placement: Oblique pedical  Findings:   -Comments: There was excellent flow of contrast along the articular pillars without intravascular flow.  Procedure Details: The fluoroscope beam is vertically oriented in AP and then obliqued 15 to 20 degrees to the ipsilateral side of the desired nerve to achieve the Scotty dog appearance.  The skin over the target area of the junction of the superior articulating process and the transverse process (sacral ala if blocking the L5 dorsal rami) was locally anesthetized with a 1 ml volume of 1% Lidocaine  without Epinephrine.  The spinal needle was inserted and advanced in a trajectory view down to the target.   After contact with periosteum and negative aspirate for blood and CSF, correct placement without intravascular or epidural spread was confirmed by injecting 0.5 ml. of Isovue-250.  A spot radiograph was obtained of this image.    Next, a 0.5 ml. volume of the injectate described above was injected. The needle  was then redirected to the other facet joint nerves mentioned above if needed.  Prior to the procedure, the patient was given a Pain Diary which was completed for baseline measurements.  After the procedure, the patient rated their pain every 30 minutes and will continue rating at this frequency for a total of 5 hours.  The patient has been asked to complete the Diary and return to us  by mail, fax or hand delivered as soon as possible.   Additional Comments:  No complications occurred Dressing: 2 x 2 sterile gauze and Band-Aid    Post-procedure details: Patient was observed during the procedure. Post-procedure instructions were reviewed.  Patient left the clinic in stable condition.

## 2024-08-03 NOTE — Progress Notes (Signed)
 "  Donna Green - 83 y.o. female MRN [id number]  Date of birth: [birth year]  Office Visit Note: Visit Date: 07/23/2024 PCP: Rexanne Ingle, MD Referred by: Rexanne Ingle, MD  Subjective: Chief Complaint  Patient presents with   Lower Back - Pain   HPI:  Donna Green is a 83 y.o. female who comes in today for planned repeat Bilateral L4-5 and L5-S1 Lumbar facet/medial branch block with fluoroscopic guidance.  The patient has failed conservative care including home exercise, medications, time and activity modification.  This injection will be diagnostic and hopefully therapeutic.  Please see requesting physician notes for further details and justification.  Exam shows concordant low back pain with facet joint loading and extension. Patient received more than 80% pain relief from prior injection. This would be the second block in a diagnostic double block paradigm.     Referring:Megan Trudy, FNP   ROS Otherwise per HPI.  Assessment & Plan: Visit Diagnoses:    ICD-10-CM   1. Spondylosis without myelopathy or radiculopathy, lumbar region  M47.816 XR C-ARM NO REPORT    Nerve Block    bupivacaine  (MARCAINE ) 0.5 % (with pres) injection 3 mL      Plan: No additional findings.   Meds & Orders:  Meds ordered this encounter  Medications   bupivacaine  (MARCAINE ) 0.5 % (with pres) injection 3 mL    Orders Placed This Encounter  Procedures   Nerve Block   XR C-ARM NO REPORT    Follow-up: Return for Recheck spine.   Procedures: No procedures performed  Lumbar Diagnostic Facet Joint Nerve Block with Fluoroscopic Guidance   Patient: Donna Green      Date of Birth: 10/14/1940 MRN: [id number] PCP: Rexanne Ingle, MD      Visit Date: 07/23/2024   Universal Protocol:    Date/Time: 12/22/20255:36 AM  Consent Given By: the patient  Position: PRONE  Additional Comments: Vital signs were monitored before and after the procedure. Patient was prepped and draped in the usual  sterile fashion. The correct patient, procedure, and site was verified.   Injection Procedure Details:   Procedure diagnoses:  1. Spondylosis without myelopathy or radiculopathy, lumbar region      Meds Administered:  Meds ordered this encounter  Medications   bupivacaine  (MARCAINE ) 0.5 % (with pres) injection 3 mL     Laterality: Bilateral  Location/Site: L4-L5, L3 and L4 medial branches and L5-S1, L4 medial branch and L5 dorsal ramus  Needle: 5.0 in., 25 ga.  Short bevel or Quincke spinal needle  Needle Placement: Oblique pedical  Findings:   -Comments: There was excellent flow of contrast along the articular pillars without intravascular flow.  Procedure Details: The fluoroscope beam is vertically oriented in AP and then obliqued 15 to 20 degrees to the ipsilateral side of the desired nerve to achieve the Scotty dog appearance.  The skin over the target area of the junction of the superior articulating process and the transverse process (sacral ala if blocking the L5 dorsal rami) was locally anesthetized with a 1 ml volume of 1% Lidocaine  without Epinephrine.  The spinal needle was inserted and advanced in a trajectory view down to the target.   After contact with periosteum and negative aspirate for blood and CSF, correct placement without intravascular or epidural spread was confirmed by injecting 0.5 ml. of Isovue-250.  A spot radiograph was obtained of this image.    Next, a 0.5 ml. volume of the injectate described above was injected. The needle  was then redirected to the other facet joint nerves mentioned above if needed.  Prior to the procedure, the patient was given a Pain Diary which was completed for baseline measurements.  After the procedure, the patient rated their pain every 30 minutes and will continue rating at this frequency for a total of 5 hours.  The patient has been asked to complete the Diary and return to us  by mail, fax or hand delivered as soon as  possible.   Additional Comments:  No complications occurred Dressing: 2 x 2 sterile gauze and Band-Aid    Post-procedure details: Patient was observed during the procedure. Post-procedure instructions were reviewed.  Patient left the clinic in stable condition.   Clinical History: CLINICAL DATA:  Low back injury. Short fall down steps at home. Landed on her back.   EXAM: CT LUMBAR SPINE WITHOUT CONTRAST   TECHNIQUE: Multidetector CT imaging of the lumbar spine was performed without intravenous contrast administration. Multiplanar CT image reconstructions were also generated.   RADIATION DOSE REDUCTION: This exam was performed according to the departmental dose-optimization program which includes automated exposure control, adjustment of the mA and/or kV according to patient size and/or use of iterative reconstruction technique.   COMPARISON:  CT abdomen and pelvis and reconstructions 03/22/2022.   FINDINGS: Segmentation: 5 lumbar type vertebrae.   Alignment: No new abnormality. Again noted is a slight dextroscoliosis, a trace chronic degenerative retrolisthesis at L2-3, and a 1 cm chronic grade 1 anterolisthesis related to chronic disc collapse and DJD at L5-S1.   Vertebrae: Osteopenia. There is a minimal new concave compression deformity of the L1 upper plate and a slight posterior bowing of the dorsal vertebral cortex.   This could be acute and is certainly new from 03/22/2022, but could be subacute as there is linear sclerosis in portions of the vertebral body which may be seen with compaction of trabecular elements with an acute injury, or with a healing response due to a subacute injury.   No other new injury is seen. There are mild chronic upper plate anterior wedge compression fractures of the T12 and L4 vertebral bodies and a chronic mild lower plate anterior wedge compression fracture of L2.   There is lumbar spondylosis. Chronic moderate reactive  endplate sclerosis opposite L5-S1. Prominent superior endplate Schmorl's node formation T12 also chronic.   All pedicles and posterior elements are intact, with spinous process abutment L3-5 with opposing surface spurring.   No primary pathologic process is suspected. In addition there is no sacral insufficiency fracture.   Paraspinal and other soft tissues: No paraspinal mass or hematoma. Slight paravertebral edema at L1.   Disc levels: The lumbar disc spaces are normal in height except for a chronic advanced collapse of L5-S1, with vacuum phenomenon.   At T12-L1 and L1-2 there is no significant soft tissue or bony encroachment on the thecal sac and foramina.   At L2-3, there is mild spinal canal stenosis due to posterior disc osteophyte complex and facet joint spurring change, bilateral mild to moderate acquired foraminal stenosis.   At L3-4, there is mild-to-moderate spinal canal stenosis due to dorsal ligamentous thickening, posterior disc osteophyte complex and moderate facet osteophytes. There are bilateral mild-to-moderate foraminal stenosis.   At L4-5, there is a mild nonstenosing posterior disc bulge, with effacement in the lateral recesses due to moderate facet hypertrophy. Due to foraminal disc bulging there is moderate left and mild right foraminal stenosis.   At L5-S1, chronic disc collapse, vacuum phenomenon is noted, and  a small right paracentral calcified disc extrusion. Due to the anterolisthesis there is moderate spinal canal stenosis and severe bilateral foraminal stenosis. Advanced facet hypertrophy.   IMPRESSION: 1. Minimal new concave compression deformity of the L1 upper plate with slight posterior bowing of the dorsal vertebral cortex. This could be acute or subacute. There is only mild paravertebral edema and no paravertebral hematoma. 2. No other recent fractures are seen. Chronic compression fractures described above. 3. Osteopenia, degenerative  and chronic changes. 4. Spinous process abutment L3-5 with opposing surface spurring. 5. No new abnormal alignment. 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 6. Remaining detailed findings discussed above.     Electronically Signed   By: Francis Quam M.D.   On: 06/02/2023 02:35     Objective:  VS:  HT:    WT:   BMI:     BP:(!) 172/80  HR:79bpm  TEMP: ( )  RESP:  Physical Exam Vitals and nursing note reviewed.  Constitutional:      General: She is not in acute distress.    Appearance: Normal appearance. She is not ill-appearing.  HENT:     Head: Normocephalic and atraumatic.     Right Ear: External ear normal.     Left Ear: External ear normal.  Eyes:     Extraocular Movements: Extraocular movements intact.  Cardiovascular:     Rate and Rhythm: Normal rate.     Pulses: Normal pulses.  Pulmonary:     Effort: Pulmonary effort is normal. No respiratory distress.  Abdominal:     General: There is no distension.     Palpations: Abdomen is soft.  Musculoskeletal:        General: Tenderness present.     Cervical back: Neck supple.     Right lower leg: No edema.     Left lower leg: No edema.     Comments: Patient has good distal strength with no pain over the greater trochanters.  No clonus or focal weakness.  Skin:    Findings: No erythema, lesion or rash.  Neurological:     General: No focal deficit present.     Mental Status: She is alert and oriented to person, place, and time.     Sensory: No sensory deficit.     Motor: No weakness or abnormal muscle tone.     Coordination: Coordination normal.  Psychiatric:        Mood and Affect: Mood normal.        Behavior: Behavior normal.      Imaging: No results found. "

## 2024-08-04 ENCOUNTER — Other Ambulatory Visit: Payer: Self-pay | Admitting: Physical Medicine and Rehabilitation

## 2024-08-04 ENCOUNTER — Telehealth: Payer: Self-pay

## 2024-08-04 MED ORDER — DIAZEPAM 5 MG PO TABS: ORAL_TABLET | ORAL | 0 refills | Status: AC

## 2024-08-04 NOTE — Telephone Encounter (Signed)
 Requesting some thing to calm nerves. Patient has several allergies. Please advise and send message in patients [facility]

## 2024-08-26 ENCOUNTER — Other Ambulatory Visit: Payer: Self-pay

## 2024-08-26 ENCOUNTER — Ambulatory Visit: Admitting: Physical Medicine and Rehabilitation

## 2024-08-26 DIAGNOSIS — M47816 Spondylosis without myelopathy or radiculopathy, lumbar region: Secondary | ICD-10-CM

## 2024-08-26 MED ORDER — METHYLPREDNISOLONE ACETATE 40 MG/ML IJ SUSP
40.0000 mg | Freq: Once | INTRAMUSCULAR | Status: AC
  Administered 2024-08-26: 40 mg

## 2024-08-26 NOTE — Progress Notes (Signed)
 "  Donna Green - 84 y.o. female MRN [id number]  Date of birth: [birth year]  Office Visit Note: Visit Date: 08/26/2024 PCP: Rexanne Ingle, MD Referred by: Rexanne Ingle, MD  Subjective: Chief Complaint  Patient presents with   Lower Back - Pain    bil L4-L5 & L5-S1 RFA-- 1st RFA Takes Tylenol . Numbness in Bil legs.   HPI:  Donna Green is a 84 y.o. female who comes in todayfor planned radiofrequency ablation of the Right L4-5 and L5-S1 Lumbar facet joints. This would be ablation of the corresponding medial branches and/or dorsal rami.  Patient has had double diagnostic blocks with more than 50% relief.  These are documented on pain diary.  They have had chronic back pain for quite some time, more than 3 months, which has been an ongoing situation with recalcitrant axial back pain.  They have no radicular pain.  Their axial pain is worse with standing and ambulating and on exam today with facet loading.  They have had physical therapy as well as home exercise program.  The imaging noted in the chart below indicated facet pathology. Accordingly they meet all the criteria and qualification for for radiofrequency ablation and we are going to complete this today hopefully for more longer term relief as part of comprehensive management program.   ROS Otherwise per HPI.  Assessment & Plan: Visit Diagnoses:    ICD-10-CM   1. Spondylosis without myelopathy or radiculopathy, lumbar region  M47.816 XR C-ARM NO REPORT    Radiofrequency,Lumbar    methylPREDNISolone  acetate (DEPO-MEDROL ) injection 40 mg      Plan: No additional findings.   Meds & Orders:  Meds ordered this encounter  Medications   methylPREDNISolone  acetate (DEPO-MEDROL ) injection 40 mg    Orders Placed This Encounter  Procedures   Radiofrequency,Lumbar   XR C-ARM NO REPORT    Follow-up: Return for visit to requesting provider as needed.   Procedures: No procedures performed  Lumbar Facet Joint Nerve  Denervation  Patient: Donna Green      Date of Birth: [birth year] MRN: [id number] PCP: Rexanne Ingle, MD      Visit Date: 08/26/2024   Universal Protocol:    Date/Time: 1/14/20264:16 PM  Consent Given By: the patient  Position: PRONE  Additional Comments: Vital signs were monitored before and after the procedure. Patient was prepped and draped in the usual sterile fashion. The correct patient, procedure, and site was verified.   Injection Procedure Details:   Procedure diagnoses:  1. Spondylosis without myelopathy or radiculopathy, lumbar region      Meds Administered:  Meds ordered this encounter  Medications   methylPREDNISolone  acetate (DEPO-MEDROL ) injection 40 mg     Laterality: Right  Location/Site:  L4-L5, L3 and L4 medial branches and L5-S1, L4 medial branch and L5 dorsal ramus  Needle: 18 ga.,  10mm active tip, RF Cannula  Needle Placement: Along juncture of superior articular process and transverse pocess  Findings:  -Comments:  Procedure Details: For each desired target nerve, the corresponding transverse process (sacral ala for the L5 dorsal rami) was identified and the fluoroscope was positioned to square off the endplates of the corresponding vertebral body to achieve a true AP midline view.  The beam was then obliqued 15 to 20 degrees and caudally tilted 15 to 20 degrees to line up a trajectory along the target nerves. The skin over the target of the junction of superior articulating process and transverse process (sacral ala for the L5 dorsal  rami) was infiltrated with 1ml of 1% Lidocaine  without Epinephrine.  The 18 gauge 10mm active tip outer cannula was advanced in trajectory view to the target.  This procedure was repeated for each target nerve.  Then, for all levels, the outer cannula placement was fine-tuned and the position was then confirmed with bi-planar imaging.    Test stimulation was done both at sensory and motor levels to ensure  there was no radicular stimulation. The target tissues were then infiltrated with 1 ml of 1% Lidocaine  without Epinephrine. Subsequently, a percutaneous neurotomy was carried out for 90 seconds at 80 degrees Celsius.  After the completion of the lesion, 1 ml of injectate was delivered. It was then repeated for each facet joint nerve mentioned above. Appropriate radiographs were obtained to verify the probe placement during the neurotomy.   Additional Comments:  The patient tolerated the procedure well Dressing: 2 x 2 sterile gauze and Band-Aid    Post-procedure details: Patient was observed during the procedure. Post-procedure instructions were reviewed.  Patient left the clinic in stable condition.      Clinical History: CLINICAL DATA:  Low back injury. Short fall down steps at home. Landed on her back.   EXAM: CT LUMBAR SPINE WITHOUT CONTRAST   TECHNIQUE: Multidetector CT imaging of the lumbar spine was performed without intravenous contrast administration. Multiplanar CT image reconstructions were also generated.   RADIATION DOSE REDUCTION: This exam was performed according to the departmental dose-optimization program which includes automated exposure control, adjustment of the mA and/or kV according to patient size and/or use of iterative reconstruction technique.   COMPARISON:  CT abdomen and pelvis and reconstructions 03/22/2022.   FINDINGS: Segmentation: 5 lumbar type vertebrae.   Alignment: No new abnormality. Again noted is a slight dextroscoliosis, a trace chronic degenerative retrolisthesis at L2-3, and a 1 cm chronic grade 1 anterolisthesis related to chronic disc collapse and DJD at L5-S1.   Vertebrae: Osteopenia. There is a minimal new concave compression deformity of the L1 upper plate and a slight posterior bowing of the dorsal vertebral cortex.   This could be acute and is certainly new from 03/22/2022, but could be subacute as there is linear  sclerosis in portions of the vertebral body which may be seen with compaction of trabecular elements with an acute injury, or with a healing response due to a subacute injury.   No other new injury is seen. There are mild chronic upper plate anterior wedge compression fractures of the T12 and L4 vertebral bodies and a chronic mild lower plate anterior wedge compression fracture of L2.   There is lumbar spondylosis. Chronic moderate reactive endplate sclerosis opposite L5-S1. Prominent superior endplate Schmorl's node formation T12 also chronic.   All pedicles and posterior elements are intact, with spinous process abutment L3-5 with opposing surface spurring.   No primary pathologic process is suspected. In addition there is no sacral insufficiency fracture.   Paraspinal and other soft tissues: No paraspinal mass or hematoma. Slight paravertebral edema at L1.   Disc levels: The lumbar disc spaces are normal in height except for a chronic advanced collapse of L5-S1, with vacuum phenomenon.   At T12-L1 and L1-2 there is no significant soft tissue or bony encroachment on the thecal sac and foramina.   At L2-3, there is mild spinal canal stenosis due to posterior disc osteophyte complex and facet joint spurring change, bilateral mild to moderate acquired foraminal stenosis.   At L3-4, there is mild-to-moderate spinal canal stenosis due to  dorsal ligamentous thickening, posterior disc osteophyte complex and moderate facet osteophytes. There are bilateral mild-to-moderate foraminal stenosis.   At L4-5, there is a mild nonstenosing posterior disc bulge, with effacement in the lateral recesses due to moderate facet hypertrophy. Due to foraminal disc bulging there is moderate left and mild right foraminal stenosis.   At L5-S1, chronic disc collapse, vacuum phenomenon is noted, and a small right paracentral calcified disc extrusion. Due to the anterolisthesis there is moderate  spinal canal stenosis and severe bilateral foraminal stenosis. Advanced facet hypertrophy.   IMPRESSION: 1. Minimal new concave compression deformity of the L1 upper plate with slight posterior bowing of the dorsal vertebral cortex. This could be acute or subacute. There is only mild paravertebral edema and no paravertebral hematoma. 2. No other recent fractures are seen. Chronic compression fractures described above. 3. Osteopenia, degenerative and chronic changes. 4. Spinous process abutment L3-5 with opposing surface spurring. 5. No new abnormal alignment. 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 6. Remaining detailed findings discussed above.     Electronically Signed   By: Francis Quam M.D.   On: 06/02/2023 02:35     Objective:  VS:  HT:    WT:   BMI:     BP:   HR: bpm  TEMP: ( )  RESP:  Physical Exam Vitals and nursing note reviewed.  Constitutional:      General: She is not in acute distress.    Appearance: Normal appearance. She is not ill-appearing.  HENT:     Head: Normocephalic and atraumatic.     Right Ear: External ear normal.     Left Ear: External ear normal.  Eyes:     Extraocular Movements: Extraocular movements intact.  Cardiovascular:     Rate and Rhythm: Normal rate.     Pulses: Normal pulses.  Pulmonary:     Effort: Pulmonary effort is normal. No respiratory distress.  Abdominal:     General: There is no distension.     Palpations: Abdomen is soft.  Musculoskeletal:        General: Tenderness present.     Cervical back: Neck supple.     Right lower leg: No edema.     Left lower leg: No edema.     Comments: Patient has good distal strength with no pain over the greater trochanters.  No clonus or focal weakness.  Skin:    Findings: No erythema, lesion or rash.  Neurological:     General: No focal deficit present.     Mental Status: She is alert and oriented to person, place, and time.     Sensory: No  sensory deficit.     Motor: No weakness or abnormal muscle tone.     Coordination: Coordination normal.  Psychiatric:        Mood and Affect: Mood normal.        Behavior: Behavior normal.      Imaging: No results found. "

## 2024-08-26 NOTE — Procedures (Signed)
 Lumbar Facet Joint Nerve Denervation  Patient: Donna Green      Date of Birth: 25-Jul-1941 MRN: [id number] PCP: Rexanne Ingle, MD      Visit Date: 08/26/2024   Universal Protocol:    Date/Time: 1/14/20264:16 PM  Consent Given By: the patient  Position: PRONE  Additional Comments: Vital signs were monitored before and after the procedure. Patient was prepped and draped in the usual sterile fashion. The correct patient, procedure, and site was verified.   Injection Procedure Details:   Procedure diagnoses:  1. Spondylosis without myelopathy or radiculopathy, lumbar region      Meds Administered:  Meds ordered this encounter  Medications   methylPREDNISolone  acetate (DEPO-MEDROL ) injection 40 mg     Laterality: Right  Location/Site:  L4-L5, L3 and L4 medial branches and L5-S1, L4 medial branch and L5 dorsal ramus  Needle: 18 ga.,  10mm active tip, RF Cannula  Needle Placement: Along juncture of superior articular process and transverse pocess  Findings:  -Comments:  Procedure Details: For each desired target nerve, the corresponding transverse process (sacral ala for the L5 dorsal rami) was identified and the fluoroscope was positioned to square off the endplates of the corresponding vertebral body to achieve a true AP midline view.  The beam was then obliqued 15 to 20 degrees and caudally tilted 15 to 20 degrees to line up a trajectory along the target nerves. The skin over the target of the junction of superior articulating process and transverse process (sacral ala for the L5 dorsal rami) was infiltrated with 1ml of 1% Lidocaine  without Epinephrine.  The 18 gauge 10mm active tip outer cannula was advanced in trajectory view to the target.  This procedure was repeated for each target nerve.  Then, for all levels, the outer cannula placement was fine-tuned and the position was then confirmed with bi-planar imaging.    Test stimulation was done both at sensory and  motor levels to ensure there was no radicular stimulation. The target tissues were then infiltrated with 1 ml of 1% Lidocaine  without Epinephrine. Subsequently, a percutaneous neurotomy was carried out for 90 seconds at 80 degrees Celsius.  After the completion of the lesion, 1 ml of injectate was delivered. It was then repeated for each facet joint nerve mentioned above. Appropriate radiographs were obtained to verify the probe placement during the neurotomy.   Additional Comments:  The patient tolerated the procedure well Dressing: 2 x 2 sterile gauze and Band-Aid    Post-procedure details: Patient was observed during the procedure. Post-procedure instructions were reviewed.  Patient left the clinic in stable condition.

## 2024-09-08 ENCOUNTER — Telehealth: Payer: Self-pay | Admitting: Physical Medicine and Rehabilitation

## 2024-09-08 NOTE — Telephone Encounter (Signed)
 Patient would like to [facility] her appointment

## 2024-09-09 ENCOUNTER — Encounter: Admitting: Physical Medicine and Rehabilitation

## 2024-09-23 ENCOUNTER — Encounter: Admitting: Physical Medicine and Rehabilitation

## 2025-02-23 ENCOUNTER — Other Ambulatory Visit

## 2025-03-09 ENCOUNTER — Telehealth: Admitting: Hematology
